# Patient Record
Sex: Male | Born: 1955 | Race: Black or African American | Hispanic: No | Marital: Married | Smoking: Former smoker
Health system: Southern US, Community
[De-identification: ages and names within clinical notes are randomized; demographics above are authoritative.]

## PROBLEM LIST (undated history)

## (undated) DIAGNOSIS — C259 Malignant neoplasm of pancreas, unspecified: Secondary | ICD-10-CM

## (undated) DIAGNOSIS — I10 Essential (primary) hypertension: Secondary | ICD-10-CM

## (undated) DIAGNOSIS — F1010 Alcohol abuse, uncomplicated: Secondary | ICD-10-CM

## (undated) DIAGNOSIS — E119 Type 2 diabetes mellitus without complications: Secondary | ICD-10-CM

## (undated) HISTORY — PX: HERNIA REPAIR: SHX51

## (undated) HISTORY — PX: KNEE ARTHROSCOPY: SHX127

## (undated) HISTORY — DX: Malignant neoplasm of pancreas, unspecified: C25.9

---

## 1998-12-20 ENCOUNTER — Encounter (HOSPITAL_BASED_OUTPATIENT_CLINIC_OR_DEPARTMENT_OTHER): Payer: Self-pay | Admitting: General Surgery

## 1998-12-20 ENCOUNTER — Ambulatory Visit (HOSPITAL_COMMUNITY): Admission: RE | Admit: 1998-12-20 | Discharge: 1998-12-20 | Payer: Self-pay | Admitting: General Surgery

## 2000-12-11 ENCOUNTER — Encounter (HOSPITAL_BASED_OUTPATIENT_CLINIC_OR_DEPARTMENT_OTHER): Payer: Self-pay | Admitting: General Surgery

## 2000-12-11 ENCOUNTER — Ambulatory Visit (HOSPITAL_COMMUNITY): Admission: RE | Admit: 2000-12-11 | Discharge: 2000-12-11 | Payer: Self-pay | Admitting: General Surgery

## 2014-09-29 ENCOUNTER — Ambulatory Visit: Payer: Self-pay | Admitting: Skilled Nursing Facility1

## 2015-05-29 ENCOUNTER — Emergency Department (HOSPITAL_COMMUNITY)
Admission: EM | Admit: 2015-05-29 | Discharge: 2015-05-29 | Disposition: A | Discharge status: Home / Self Care | Payer: Managed Care, Other (non HMO) | Source: Home / Self Care | Attending: Emergency Medicine | Admitting: Emergency Medicine

## 2015-05-29 ENCOUNTER — Encounter (HOSPITAL_COMMUNITY): Payer: Self-pay | Admitting: Emergency Medicine

## 2015-05-29 DIAGNOSIS — K047 Periapical abscess without sinus: Secondary | ICD-10-CM

## 2015-05-29 HISTORY — DX: Essential (primary) hypertension: I10

## 2015-05-29 HISTORY — DX: Type 2 diabetes mellitus without complications: E11.9

## 2015-05-29 MED ORDER — IBUPROFEN 800 MG PO TABS: 800.0000 mg | ORAL_TABLET | Freq: Three times a day (TID) | ORAL | Status: DC | PRN

## 2015-05-29 MED ORDER — AMOXICILLIN 875 MG PO TABS: 875.0000 mg | ORAL_TABLET | Freq: Two times a day (BID) | ORAL | Status: DC

## 2015-05-29 MED ORDER — HYDROCODONE-ACETAMINOPHEN 5-325 MG PO TABS: 1.0000 | ORAL_TABLET | Freq: Four times a day (QID) | ORAL | Status: DC | PRN

## 2015-05-29 NOTE — ED Provider Notes (Signed)
CSN: [id number]     Arrival date & time 05/29/15  1354 History   First MD Initiated Contact with Patient 05/29/15 1430     Chief Complaint  Patient presents with  . Abscess   (Consider location/radiation/quality/duration/timing/severity/associated sxs/prior Treatment) HPI  He is a 59 year old man here for evaluation of abscess. He states he noticed swelling in his left upper gum and left upper jaw starting this morning. Over the last 2-3 hours, he has noted a scratchy throat. He denies any fevers or chills. No difficulty swallowing. No nausea or vomiting. He does have a history of what sounds like a peritonsillar abscess in the past.  Past Medical History  Diagnosis Date  . Hypertension   . Diabetes mellitus without complication [facility])    Past Surgical History  Procedure Laterality Date  . Knee arthroscopy      left and right  . Hernia repair     No family history on file. Social History  Substance Use Topics  . Smoking status: Never Smoker   . Smokeless tobacco: None  . Alcohol Use: Yes    Review of Systems As in history of present illness Allergies  Review of patient's allergies indicates no known allergies.  Home Medications   Prior to Admission medications   Medication Sig Start Date End Date Taking? Authorizing Provider  OVER THE COUNTER MEDICATION Diabetic and htn medicine   Yes Historical Provider, MD  amoxicillin (AMOXIL) 875 MG tablet Take 1 tablet (875 mg total) by mouth 2 (two) times daily. 05/29/15   Melony Overly, MD  HYDROcodone-acetaminophen (NORCO) 5-325 MG tablet Take 1 tablet by mouth every 6 (six) hours as needed for moderate pain. 05/29/15   Melony Overly, MD  ibuprofen (ADVIL,MOTRIN) 800 MG tablet Take 1 tablet (800 mg total) by mouth every 8 (eight) hours as needed for moderate pain. 05/29/15   Melony Overly, MD   Meds Ordered and Administered this Visit  Medications - No data to display  BP 162/93 mmHg  Pulse 100  Temp(Src) 98.6 F (37 C)  Resp  24  SpO2 99% No data found.   Physical Exam  Constitutional: He is oriented to person, place, and time. He appears well-developed and well-nourished. No distress.  HENT:  He has a 1 cm abscess just above the left upper lateral incisor. This is draining spontaneously. He has some associated swelling of the left nasolabial fold area. The oropharynx is normal. There is no erythema or exudate. Airway is widely patent.  Cardiovascular: Normal rate.   Pulmonary/Chest: Effort normal.  Neurological: He is alert and oriented to person, place, and time.    ED Course  Procedures (including critical care time)  Labs Review Labs Reviewed - No data to display  Imaging Review No results found.    MDM   1. Dental abscess    Abscess is draining spontaneously. Treat with amoxicillin, ibuprofen, and Norco. Discussed saltwater gargles and ice. Discussed importance of seeing a dentist to evaluate the tooth.    Melony Overly, MD 05/29/15 [phone]

## 2015-05-29 NOTE — Discharge Instructions (Signed)
You have an abscess of your gum. My concern is that it involves the base of the tooth. Take amoxicillin twice a day for 10 days. Take ibuprofen 800 mg every 8 hours as needed for pain. Use the Norco every 4-6 hours as needed for severe pain. Do not drive while taking this medicine. Do saltwater gargles as often as you can. Apply ice to your jaw to help with the swelling. Please follow-up with a dentist as soon as possible. If you develop fevers or difficulty swallowing, please go to the emergency room.

## 2015-05-29 NOTE — ED Notes (Signed)
Patient reports "abscess in left cheek"  Reports a blister on gum and noticed all this today.  Reports in the last 2 hours has had a scratchy throat.  History of abscess in throat.

## 2016-03-12 ENCOUNTER — Encounter (HOSPITAL_COMMUNITY): Payer: Self-pay

## 2016-03-12 ENCOUNTER — Emergency Department (HOSPITAL_COMMUNITY): Payer: BLUE CROSS/BLUE SHIELD

## 2016-03-12 ENCOUNTER — Observation Stay (HOSPITAL_COMMUNITY)
Admission: EM | Admit: 2016-03-12 | Discharge: 2016-03-14 | Disposition: A | Discharge status: Home / Self Care | Payer: BLUE CROSS/BLUE SHIELD | Attending: Internal Medicine | Admitting: Internal Medicine

## 2016-03-12 DIAGNOSIS — Z87891 Personal history of nicotine dependence: Secondary | ICD-10-CM | POA: Diagnosis not present

## 2016-03-12 DIAGNOSIS — K831 Obstruction of bile duct: Secondary | ICD-10-CM | POA: Insufficient documentation

## 2016-03-12 DIAGNOSIS — K869 Disease of pancreas, unspecified: Secondary | ICD-10-CM | POA: Diagnosis present

## 2016-03-12 DIAGNOSIS — C250 Malignant neoplasm of head of pancreas: Principal | ICD-10-CM | POA: Insufficient documentation

## 2016-03-12 DIAGNOSIS — K8689 Other specified diseases of pancreas: Secondary | ICD-10-CM

## 2016-03-12 DIAGNOSIS — F1010 Alcohol abuse, uncomplicated: Secondary | ICD-10-CM | POA: Insufficient documentation

## 2016-03-12 DIAGNOSIS — R1013 Epigastric pain: Secondary | ICD-10-CM

## 2016-03-12 DIAGNOSIS — E1165 Type 2 diabetes mellitus with hyperglycemia: Secondary | ICD-10-CM | POA: Insufficient documentation

## 2016-03-12 DIAGNOSIS — Z794 Long term (current) use of insulin: Secondary | ICD-10-CM | POA: Insufficient documentation

## 2016-03-12 DIAGNOSIS — I10 Essential (primary) hypertension: Secondary | ICD-10-CM | POA: Diagnosis not present

## 2016-03-12 DIAGNOSIS — N179 Acute kidney failure, unspecified: Secondary | ICD-10-CM

## 2016-03-12 DIAGNOSIS — R000 Tachycardia, unspecified: Secondary | ICD-10-CM

## 2016-03-12 DIAGNOSIS — E118 Type 2 diabetes mellitus with unspecified complications: Secondary | ICD-10-CM

## 2016-03-12 HISTORY — DX: Alcohol abuse, uncomplicated: F10.10

## 2016-03-12 LAB — BASIC METABOLIC PANEL
Anion gap: 14 (ref 5–15)
BUN: 17 mg/dL (ref 6–20)
CALCIUM: 9.8 mg/dL (ref 8.9–10.3)
CO2: 21 mmol/L — ABNORMAL LOW (ref 22–32)
CREATININE: 1.37 mg/dL — AB (ref 0.61–1.24)
Chloride: 101 mmol/L (ref 101–111)
GFR calc non Af Amer: 55 mL/min — ABNORMAL LOW (ref >60)
Glucose, Bld: 184 mg/dL — ABNORMAL HIGH (ref 65–99)
Potassium: 3.8 mmol/L (ref 3.5–5.1)
SODIUM: 136 mmol/L (ref 135–145)

## 2016-03-12 LAB — CBC
HCT: 43 % (ref 39.0–52.0)
Hemoglobin: 14.3 g/dL (ref 13.0–17.0)
MCH: 27.3 pg (ref 26.0–34.0)
MCHC: 33.3 g/dL (ref 30.0–36.0)
MCV: 82.2 fL (ref 78.0–100.0)
PLATELETS: 282 10*3/uL (ref 150–400)
RBC: 5.23 MIL/uL (ref 4.22–5.81)
RDW: 13.8 % (ref 11.5–15.5)
WBC: 7.1 10*3/uL (ref 4.0–10.5)

## 2016-03-12 LAB — HEPATIC FUNCTION PANEL
ALK PHOS: 679 U/L — AB (ref 38–126)
ALT: 890 U/L — AB (ref 17–63)
AST: 414 U/L — ABNORMAL HIGH (ref 15–41)
Albumin: 3.5 g/dL (ref 3.5–5.0)
BILIRUBIN DIRECT: 4.7 mg/dL — AB (ref 0.1–0.5)
Indirect Bilirubin: 2.6 mg/dL — ABNORMAL HIGH (ref 0.3–0.9)
TOTAL PROTEIN: 7.8 g/dL (ref 6.5–8.1)
Total Bilirubin: 7.3 mg/dL — ABNORMAL HIGH (ref 0.3–1.2)

## 2016-03-12 LAB — GLUCOSE, CAPILLARY: Glucose-Capillary: 332 mg/dL — ABNORMAL HIGH (ref 65–99)

## 2016-03-12 LAB — LIPASE, BLOOD: LIPASE: 180 U/L — AB (ref 11–51)

## 2016-03-12 LAB — I-STAT TROPONIN, ED: TROPONIN I, POC: 0 ng/mL (ref 0.00–0.08)

## 2016-03-12 MED ORDER — ADULT MULTIVITAMIN W/MINERALS CH
1.0000 | ORAL_TABLET | Freq: Every day | ORAL | Status: DC
  Administered 2016-03-12 – 2016-03-14 (×3): 1 via ORAL
  Filled 2016-03-12 (×3): qty 1

## 2016-03-12 MED ORDER — THIAMINE HCL 100 MG/ML IJ SOLN
100.0000 mg | Freq: Every day | INTRAMUSCULAR | Status: DC
  Filled 2016-03-12 (×2): qty 2

## 2016-03-12 MED ORDER — POLYETHYLENE GLYCOL 3350 17 G PO PACK: 17.0000 g | PACK | Freq: Every day | ORAL | Status: DC | PRN

## 2016-03-12 MED ORDER — TRAMADOL HCL 50 MG PO TABS
50.0000 mg | ORAL_TABLET | Freq: Four times a day (QID) | ORAL | Status: DC | PRN
  Administered 2016-03-13 – 2016-03-14 (×2): 50 mg via ORAL
  Filled 2016-03-12 (×2): qty 1

## 2016-03-12 MED ORDER — SODIUM CHLORIDE 0.9 % IV SOLN
INTRAVENOUS | Status: DC
  Administered 2016-03-12: 23:00:00 via INTRAVENOUS

## 2016-03-12 MED ORDER — INSULIN ASPART 100 UNIT/ML ~~LOC~~ SOLN
0.0000 [IU] | Freq: Three times a day (TID) | SUBCUTANEOUS | Status: DC
  Administered 2016-03-13: 2 [IU] via SUBCUTANEOUS
  Administered 2016-03-13: 3 [IU] via SUBCUTANEOUS
  Administered 2016-03-14: 8 [IU] via SUBCUTANEOUS
  Administered 2016-03-14: 3 [IU] via SUBCUTANEOUS

## 2016-03-12 MED ORDER — VITAMIN B-1 100 MG PO TABS
100.0000 mg | ORAL_TABLET | Freq: Every day | ORAL | Status: DC
  Administered 2016-03-12 – 2016-03-14 (×3): 100 mg via ORAL
  Filled 2016-03-12 (×3): qty 1

## 2016-03-12 MED ORDER — IOPAMIDOL (ISOVUE-300) INJECTION 61%
100.0000 mL | Freq: Once | INTRAVENOUS | Status: AC | PRN
  Administered 2016-03-12: 100 mL via INTRAVENOUS

## 2016-03-12 MED ORDER — ONDANSETRON HCL 4 MG/2ML IJ SOLN
4.0000 mg | Freq: Four times a day (QID) | INTRAMUSCULAR | Status: DC | PRN
  Administered 2016-03-13: 4 mg via INTRAVENOUS

## 2016-03-12 MED ORDER — LORAZEPAM 2 MG/ML IJ SOLN: 1.0000 mg | Freq: Four times a day (QID) | INTRAMUSCULAR | Status: DC | PRN

## 2016-03-12 MED ORDER — MORPHINE SULFATE (PF) 4 MG/ML IV SOLN
4.0000 mg | INTRAVENOUS | Status: DC | PRN
  Administered 2016-03-13: 4 mg via INTRAVENOUS
  Filled 2016-03-12: qty 1

## 2016-03-12 MED ORDER — ACETAMINOPHEN 650 MG RE SUPP: 650.0000 mg | Freq: Four times a day (QID) | RECTAL | Status: DC | PRN

## 2016-03-12 MED ORDER — BISACODYL 5 MG PO TBEC
5.0000 mg | DELAYED_RELEASE_TABLET | Freq: Every day | ORAL | Status: DC | PRN
  Filled 2016-03-12: qty 1

## 2016-03-12 MED ORDER — ACETAMINOPHEN 325 MG PO TABS: 650.0000 mg | ORAL_TABLET | Freq: Four times a day (QID) | ORAL | Status: DC | PRN

## 2016-03-12 MED ORDER — SODIUM CHLORIDE 0.9 % IV SOLN: INTRAVENOUS | Status: DC

## 2016-03-12 MED ORDER — ONDANSETRON HCL 4 MG PO TABS: 4.0000 mg | ORAL_TABLET | Freq: Four times a day (QID) | ORAL | Status: DC | PRN

## 2016-03-12 MED ORDER — LORAZEPAM 1 MG PO TABS
1.0000 mg | ORAL_TABLET | Freq: Four times a day (QID) | ORAL | Status: DC | PRN
  Administered 2016-03-12 – 2016-03-13 (×2): 1 mg via ORAL
  Filled 2016-03-12 (×2): qty 1

## 2016-03-12 MED ORDER — FOLIC ACID 1 MG PO TABS
1.0000 mg | ORAL_TABLET | Freq: Every day | ORAL | Status: DC
  Administered 2016-03-12 – 2016-03-14 (×3): 1 mg via ORAL
  Filled 2016-03-12 (×3): qty 1

## 2016-03-12 NOTE — ED Notes (Signed)
Pt states since checking in his chest pain has worsened. Pt states he is "cold and clammy feeling"

## 2016-03-12 NOTE — ED Notes (Signed)
Pt to ultrasound

## 2016-03-12 NOTE — Consult Note (Signed)
Reason for Consult: Abnormal LFT's/pancreatic mass. Referring Physician: THP  Craig [name] is an 60 y.o. male.  HPI: Craig [name] is a 60 year old black male, who was in his usual state of health till about a month ago, when he developed some epigastric and chest discomfort. Patient claims he has seen his PCP on several occasions for this problem but no definite diagnosis was made to today when he was told he had abnormal LFTs and was sent to the hospital for further evaluation. He was found to have a pancreatic mass on evaluation in the ER after he had a CT scan nd was hospitalized for further diagnosis and treatment. The CT scan done today revealed a mass in the pancreatic head/uncinate process measuring 3.1 x 2 x 2.6 cm in size with the CBD measuring 10 mm. He was told by his wife that he his eyes appeared yellow and over the last month he's noted darker urine. He has lost about 10 pounds in the last couple weeks. His appetite is fair. He denies having any melena or hematochezia he has a problem with occasional constipation. There is no history of abdominal pain nausea vomiting dysphagia or odynophagia. He is up-to-date on his colonoscopy which was done a couple of years ago by Dr. Carol Ada. Past Medical History:  Diagnosis Date  . Diabetes mellitus without complication ([place])   . ETOH abuse   . Hypertension    Past Surgical History:  Procedure Laterality Date  . HERNIA REPAIR    . KNEE ARTHROSCOPY     left and right   Family History  Problem Relation Age of Onset  . Diabetes Mother    Social History:  reports that he has never smoked. He has never used smokeless tobacco. He reports that he drinks alcohol. He admits to drinking about 6 beers per night for the last 15 years  He reports that he does not use drugs. He works in the produce department" and [place] he is married and lives with his wife here in [place]. Allergies: No Known Allergies  Medications: I  have reviewed the patient's current medications.  Results for orders placed or performed during the hospital encounter of 03/12/16 (from the past 48 hour(s))  Basic metabolic panel     Status: Abnormal   Collection Time: 03/12/16  9:33 AM  Result Value Ref Range   Sodium 136 135 - 145 mmol/L   Potassium 3.8 3.5 - 5.1 mmol/L   Chloride 101 101 - 111 mmol/L   CO2 21 (L) 22 - 32 mmol/L   Glucose, Bld 184 (H) 65 - 99 mg/dL   BUN 17 6 - 20 mg/dL   Creatinine, Ser 1.37 (H) 0.61 - 1.24 mg/dL   Calcium 9.8 8.9 - 10.3 mg/dL   GFR calc non Af Amer 55 (L) >60 mL/min   GFR calc Af Amer >60 >60 mL/min    Comment: (NOTE) The eGFR has been calculated using the CKD EPI equation. This calculation has not been validated in all clinical situations. eGFR's persistently <60 mL/min signify possible Chronic Kidney Disease.    Anion gap 14 5 - 15  CBC     Status: None   Collection Time: 03/12/16  9:33 AM  Result Value Ref Range   WBC 7.1 4.0 - 10.5 K/uL   RBC 5.23 4.22 - 5.81 MIL/uL   Hemoglobin 14.3 13.0 - 17.0 g/dL   HCT 43.0 39.0 - 52.0 %   MCV 82.2 78.0 - 100.0  fL   MCH 27.3 26.0 - 34.0 pg   MCHC 33.3 30.0 - 36.0 g/dL   RDW 13.8 11.5 - 15.5 %   Platelets 282 150 - 400 K/uL  Lipase, blood     Status: Abnormal   Collection Time: 03/12/16  9:33 AM  Result Value Ref Range   Lipase 180 (H) 11 - 51 U/L  Hepatic function panel     Status: Abnormal   Collection Time: 03/12/16  9:33 AM  Result Value Ref Range   Total Protein 7.8 6.5 - 8.1 g/dL   Albumin 3.5 3.5 - 5.0 g/dL   AST 414 (H) 15 - 41 U/L   ALT 890 (H) 17 - 63 U/L   Alkaline Phosphatase 679 (H) 38 - 126 U/L   Total Bilirubin 7.3 (H) 0.3 - 1.2 mg/dL   Bilirubin, Direct 4.7 (H) 0.1 - 0.5 mg/dL   Indirect Bilirubin 2.6 (H) 0.3 - 0.9 mg/dL  I-stat troponin, ED     Status: None   Collection Time: 03/12/16  9:54 AM  Result Value Ref Range   Troponin i, poc 0.00 0.00 - 0.08 ng/mL   Comment 3            Comment: Due to the release kinetics  of cTnI, a negative result within the first hours of the onset of symptoms does not rule out myocardial infarction with certainty. If myocardial infarction is still suspected, repeat the test at appropriate intervals.     Dg Chest 2 View  Result Date: 03/12/2016 CLINICAL DATA:  Chest and epigastric region pain EXAM: CHEST  2 VIEW COMPARISON:  None. FINDINGS: Lungs are clear. Heart size and pulmonary vascularity are normal. No adenopathy. There is degenerative change in the thoracic spine. IMPRESSION: No edema or consolidation. Electronically Signed   By: Lowella Grip III M.D.   On: 03/12/2016 10:10   US Abdomen Complete  Result Date: 03/12/2016 CLINICAL DATA:  Chest pain.  Abnormal LFTs. EXAM: ABDOMEN ULTRASOUND COMPLETE COMPARISON:  None. FINDINGS: Gallbladder: No gallstones or wall thickening visualized. No sonographic Murphy sign noted by sonographer. Moderate distention, normal wall thickness. Common bile duct: Diameter: Increased, between 7.3 and 7.9 mm. Liver: No focal lesion identified. Within normal limits in parenchymal echogenicity. Intrahepatic biliary ductal dilatation is present. IVC: No abnormality visualized. Pancreas: Increased size of the pancreatic duct, up to 4 mm. No mass is evident. Spleen: Size and appearance within normal limits. Right Kidney: Length: 9.9 cm. Echogenicity within normal limits. No mass or hydronephrosis visualized. Left Kidney: Length: 11.8 cm. Echogenicity within normal limits. No mass or hydronephrosis visualized. Two 1 cm simple cysts are noted in the kidney, non worrisome Abdominal aorta: No aneurysm visualized. Other findings: None. IMPRESSION: Intra and extrahepatic biliary ductal dilatation is present, along with abnormal dilatation of the pancreatic duct. CBD up to 8 mm and PD approximately 4 mm. Concern for ampullary mass or stone. CT abdomen and pelvis with contrast recommended for further evaluation. Electronically Signed   By: Staci Righter M.D.    On: 03/12/2016 12:22   Ct Abdomen Pelvis W Contrast  Result Date: 03/12/2016 CLINICAL DATA:  Epigastric pain off and on for 1 month, abnormal bile duct by ultrasound EXAM: CT ABDOMEN AND PELVIS WITH CONTRAST TECHNIQUE: Multidetector CT imaging of the abdomen and pelvis was performed using the standard protocol following bolus administration of intravenous contrast. Sagittal and coronal MPR images reconstructed from axial data set. CONTRAST:  132m ISOVUE-300 IOPAMIDOL (ISOVUE-300) INJECTION 61% IV. No oral contrast  administered. COMPARISON:  None; correlation ultrasound abdomen 03/12/2016 FINDINGS: Lower chest:  Lung bases clear Hepatobiliary: Liver normal appearance. Small fold at gallbladder fundus, gallbladder otherwise unremarkable. Prominent CBD 10 mm diameter on coronal images. No significant intrahepatic biliary dilatation. Pancreas: Somewhat ill defined low-attenuation mass at head/uncinate of pancreas, 3.1 x 2.0 x 2.6 cm in size highly suspicious for a pancreatic neoplasm. Preserved fat planes between the pancreatic mass, SMA, and SMV. Mass abuts the second and third portions of duodenum. Question loss of fat plane between the posterior aspect of mass and IVC. No pancreatic ductal dilatation or atrophy. No pancreatic calcifications or additional fluid collections. Spleen: Normal appearance Adrenals/Urinary Tract: Adrenal glands normal appearance. Tiny RIGHT renal cyst. Kidneys, ureters, and bladder otherwise normal appearance. Stomach/Bowel: Normal appendix. Stomach and bowel loops normal appearance. Vascular/Lymphatic: Atherosclerotic calcifications aorta and iliac arteries. Coronary arterial calcifications. No definite adenopathy. Reproductive: N/A Other: Nodular irregularity of the anterior abdominal wall at the umbilicus question prior umblical hernia repair. Tiny LEFT parasagittal periumbilical hernia. No free air or free fluid. Musculoskeletal: Osseous structures unremarkable. IMPRESSION: Mildly  dilated CBD 10 mm diameter without intrahepatic biliary dilatation. 3.1 x 2.0 x 2.6 cm diameter mass at head/uncinate process of pancreas highly suspicious for pancreatic neoplasm. Findings called to Dr. Tomi Bamberger on 03/12/2016 at 1353 hours. Electronically Signed   By: Lavonia Dana M.D.   On: 03/12/2016 13:54   Review of Systems  Constitutional: Positive for malaise/fatigue and weight loss. Negative for chills and fever.  HENT: Negative.   Eyes: Negative.   Respiratory: Positive for shortness of breath. Negative for cough, hemoptysis, sputum production and wheezing.   Cardiovascular: Positive for chest pain. Negative for palpitations, orthopnea, claudication and PND.  Gastrointestinal: Negative for abdominal pain, blood in stool, diarrhea, heartburn, melena, nausea and vomiting.  Genitourinary: Negative.   Musculoskeletal: Negative.   Skin: Negative.   Neurological: Positive for weakness.  Endo/Heme/Allergies: Negative.   Psychiatric/Behavioral: Negative.    Blood pressure 119/77, pulse 97, temperature 98.2 F (36.8 C), temperature source Oral, resp. rate 21, SpO2 100 %. Physical Exam  Constitutional: He is oriented to person, place, and time. He appears well-developed and well-nourished.  HENT:  Head: Normocephalic and atraumatic.  Eyes: EOM are normal. Pupils are equal, round, and reactive to light. Scleral icterus is present.  Neck: Normal range of motion. Neck supple.  Cardiovascular: Normal rate and regular rhythm.   Respiratory: Effort normal and breath sounds normal.  GI: Soft. Bowel sounds are normal.  Musculoskeletal: Normal range of motion.  Neurological: He is alert and oriented to person, place, and time.  Skin: Skin is warm and dry.   Assessment/Plan: 1) Abnormal LFTs with a 3 cm mass in the pancreatic head/uncinate process with a dilated CBD at 10 mm-will plan a ERCP for tomorrow. 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 2) Alcohol abuse.  3)  Hypertension.  4) AODM.  Bassel Gaskill 03/12/2016, 6:42 PM

## 2016-03-12 NOTE — ED Notes (Signed)
Report called to 2 west.

## 2016-03-12 NOTE — ED Notes (Signed)
Pt last ate last night, only has had water this am.

## 2016-03-12 NOTE — H&P (Signed)
History and Physical    Craig [name] [id number] DOB: [birth year] DOA: 03/12/2016  PCP: Jilda Panda, MD  Patient coming from:   Home   Chief Complaint: Epigastric pain  HPI: Craig [name] is a 60 y.o. male with a medical history significant for, but not  limited to,  alcohol abuse, diabetes, high blood pressure. Patient has been having intermittent epigastric pain for one month. Pain is unrelated to meals or position, or activity.. Pain does not radiate and there is no associated nausea. Patient's wife states he has clearly lost weight. PCP has been evaluating patient and referral to cardiologist was made.. In the interim patient's LFTs were found to be markedly abnormal, patient was sent to the emergency department for evaluation.  Patient has remote history of smoking. He drinks a sixpack every day, none in the last 3 days. No family history of pancreatic cancer.  ED Course:  Afebrile, tachycardic 101-120, blood pressure borderline low at times. Point-of-care troponin negative, glucose 184, lipase 180, AST 414, ALT 890, total bilirubin 7.2, alkaline phosphatase 679, creatinine 1.37 per the sees no baseline to compare)  Ultrasound reveals intra-and extrahepatic biliary duct dilation with abnormal dilation of pancreatic duct. CT scan show CBD at 10 mm, no intrahepatic biliary duct dilation. There is a pancreatic head mass suspicious for pancreatic neoplasm seen   Review of Systems: As per HPI, otherwise 10 point review of systems negative.   Past Medical History:  Diagnosis Date  . Diabetes mellitus without complication ([place])   . Hypertension     Past Surgical History:  Procedure Laterality Date  . HERNIA REPAIR    . KNEE ARTHROSCOPY     left and right    Social History   Social History  . Marital status: Married    Spouse name: N/A  . Number of children: N/A  . Years of education: N/A   Occupational History  . Not on file.   Social History Main Topics  .  Smoking status: Never Smoker  . Smokeless tobacco: Never Used  . Alcohol use Yes  . Drug use: No  . Sexual activity: Not on file   Other Topics Concern  . Not on file   Social History Narrative  . No narrative on file   Married, lives with wife at home. No assistive devices needed for ambulation. Nonsmoker. Patient drinks a 6 pack every day No Known Allergies  Family History  Problem Relation Age of Onset  . Diabetes Mother     Prior to Admission medications   Medication Sig Start Date End Date Taking? Authorizing Provider  Insulin Glargine (LANTUS SOLOSTAR) 100 UNIT/ML Solostar Pen Inject 22 Units into the skin every morning.   Yes Historical Provider, MD    Physical Exam: Vitals:   03/12/16 1035 03/12/16 1045 03/12/16 1050 03/12/16 1105  BP: (!) 81/56 100/68 113/70 108/81  Pulse: 120 96 102 101  Resp: 19 18 18 19   Temp:      TempSrc:      SpO2: 100% 97% 99% 97%    Constitutional:  Pleasant 60 year old black male in NAD, calm, comfortable Vitals:   03/12/16 1035 03/12/16 1045 03/12/16 1050 03/12/16 1105  BP: (!) 81/56 100/68 113/70 108/81  Pulse: 120 96 102 101  Resp: 19 18 18 19   Temp:      TempSrc:      SpO2: 100% 97% 99% 97%   Eyes: PER, lids and conjunctivae normal ENMT: Mucous membranes are moist. Posterior pharynx clear of any  exudate or lesions..  Neck: normal, supple, no masses Respiratory: clear to auscultation bilaterally, no wheezing, no crackles. Normal respiratory effort. No accessory muscle use.  Cardiovascular: Regular rate and rhythm, no murmurs / rubs / gallops. No extremity edema. 2+ pedal pulses.   Abdomen: no tenderness, no masses palpated. No hepatomegaly. Bowel sounds positive.  Musculoskeletal: no clubbing / cyanosis. No joint deformity upper and lower extremities. Good ROM, no contractures. Normal muscle tone.  Skin: no rashes, lesions, ulcers.  Neurologic: CN 2-12 grossly intact. Sensation intact, Strength 5/5 in all 4.  Psychiatric:  Normal judgment and insight. Alert and oriented x 3. Normal mood.   Labs on Admission: I have personally reviewed following labs and imaging studies  Radiological Exams on Admission: Dg Chest 2 View  Result Date: 03/12/2016 CLINICAL DATA:  Chest and epigastric region pain EXAM: CHEST  2 VIEW COMPARISON:  None. FINDINGS: Lungs are clear. Heart size and pulmonary vascularity are normal. No adenopathy. There is degenerative change in the thoracic spine. IMPRESSION: No edema or consolidation. Electronically Signed   By: Lowella Grip III M.D.   On: 03/12/2016 10:10   US Abdomen Complete  Result Date: 03/12/2016 CLINICAL DATA:  Chest pain.  Abnormal LFTs. EXAM: ABDOMEN ULTRASOUND COMPLETE COMPARISON:  None. FINDINGS: Gallbladder: No gallstones or wall thickening visualized. No sonographic Murphy sign noted by sonographer. Moderate distention, normal wall thickness. Common bile duct: Diameter: Increased, between 7.3 and 7.9 mm. Liver: No focal lesion identified. Within normal limits in parenchymal echogenicity. Intrahepatic biliary ductal dilatation is present. IVC: No abnormality visualized. Pancreas: Increased size of the pancreatic duct, up to 4 mm. No mass is evident. Spleen: Size and appearance within normal limits. Right Kidney: Length: 9.9 cm. Echogenicity within normal limits. No mass or hydronephrosis visualized. Left Kidney: Length: 11.8 cm. Echogenicity within normal limits. No mass or hydronephrosis visualized. Two 1 cm simple cysts are noted in the kidney, non worrisome Abdominal aorta: No aneurysm visualized. Other findings: None. IMPRESSION: Intra and extrahepatic biliary ductal dilatation is present, along with abnormal dilatation of the pancreatic duct. CBD up to 8 mm and PD approximately 4 mm. Concern for ampullary mass or stone. CT abdomen and pelvis with contrast recommended for further evaluation. Electronically Signed   By: Staci Righter M.D.   On: 03/12/2016 12:22   Ct Abdomen Pelvis  W Contrast  Result Date: 03/12/2016 CLINICAL DATA:  Epigastric pain off and on for 1 month, abnormal bile duct by ultrasound EXAM: CT ABDOMEN AND PELVIS WITH CONTRAST TECHNIQUE: Multidetector CT imaging of the abdomen and pelvis was performed using the standard protocol following bolus administration of intravenous contrast. Sagittal and coronal MPR images reconstructed from axial data set. CONTRAST:  163mL ISOVUE-300 IOPAMIDOL (ISOVUE-300) INJECTION 61% IV. No oral contrast administered. COMPARISON:  None; correlation ultrasound abdomen 03/12/2016 FINDINGS: Lower chest:  Lung bases clear Hepatobiliary: Liver normal appearance. Small fold at gallbladder fundus, gallbladder otherwise unremarkable. Prominent CBD 10 mm diameter on coronal images. No significant intrahepatic biliary dilatation. Pancreas: Somewhat ill defined low-attenuation mass at head/uncinate of pancreas, 3.1 x 2.0 x 2.6 cm in size highly suspicious for a pancreatic neoplasm. Preserved fat planes between the pancreatic mass, SMA, and SMV. Mass abuts the second and third portions of duodenum. Question loss of fat plane between the posterior aspect of mass and IVC. No pancreatic ductal dilatation or atrophy. No pancreatic calcifications or additional fluid collections. Spleen: Normal appearance Adrenals/Urinary Tract: Adrenal glands normal appearance. Tiny RIGHT renal cyst. Kidneys, ureters, and bladder otherwise  normal appearance. Stomach/Bowel: Normal appendix. Stomach and bowel loops normal appearance. Vascular/Lymphatic: Atherosclerotic calcifications aorta and iliac arteries. Coronary arterial calcifications. No definite adenopathy. Reproductive: N/A Other: Nodular irregularity of the anterior abdominal wall at the umbilicus question prior umblical hernia repair. Tiny LEFT parasagittal periumbilical hernia. No free air or free fluid. Musculoskeletal: Osseous structures unremarkable. IMPRESSION: Mildly dilated CBD 10 mm diameter without  intrahepatic biliary dilatation. 3.1 x 2.0 x 2.6 cm diameter mass at head/uncinate process of pancreas highly suspicious for pancreatic neoplasm. Findings called to Dr. Tomi Bamberger on 03/12/2016 at 1353 hours. Electronically Signed   By: Lavonia Dana M.D.   On: 03/12/2016 13:54   EKG shows sinus tachycardia, rate 105, nonspecific T-wave abnormalities. QTC 457.  Assessment/Plan   Active Problems:   Pancreatic mass   Epigastric pain   AKI (acute kidney injury) ([place])   Diabetes mellitus, type 2 ([facility])   ETOH abuse   Sinus tachycardia ([facility])   Epigastric pain, markedly abnormal LFTs and mixed pattern in setting of biliary duct dilation and pancreatic mass  on imaging.  -Place in Obs - medical bed         -Pain control -Consult Dr. Benson Norway - patient known to him.    AKI, baseline creatine unknown so not sure if he has CKD.  Cr 1.27, normal BUN -BUN doesn't suggest volume depletion but has had little PO today, will start fluids at 75 ml /hr -avoid nephrotoxic medications -am bmet  Sinus tachycardia, probably secondary to pain and anxiety since receiving news of pancreatic mass -Monitor on telemetry  DM2. .Takes 22 units basal insulin in am (didn't have today) -A1c SSI- moderate  HTN, controlled at present. He is not on any home meds.   ETOH abuse. Drinks 6 beers / night + Liquor. Last drink 3 days ago.   -CIWA  DVT prophylaxis:     SCDs, will likely need procedures soon Code Status:     Full code   Family Communication:    Treatment plan discussed with wife in the room and she understands and agrees with the plan..  Disposition Plan:   Discharge in 24-48 hours              Consults called:  Dr. Benson Norway - [facility].   Admission status:   Observation - Medical   Tye Savoy NP [facility] Pager [phone]  If 7PM-7AM, please contact night-coverage [email or web address] Password [email or web address]  03/12/2016, 4:55 PM

## 2016-03-12 NOTE — ED Provider Notes (Signed)
[facility] DEPT Provider Note   CSN: [id number] Arrival date & time: 03/12/16  [id number]  First Provider Contact:  First MD Initiated Contact with Patient 03/12/16 1038        History   Chief Complaint Chief Complaint  Patient presents with  . Chest Pain    HPI DAWTON Craig [name] is a 60 y.o. male.  Pt has seen his PCP for this problem over the past month.   He was told that he had abnormal liver enzymes and needed to come to the ED for an [place].  He has seen his PCP several times and is setup for a stres test on monday   The history is provided by the patient.  Chest Pain   This is a recurrent problem. The current episode started more than 1 week ago (about one month). The problem has been gradually worsening. The pain is present in the epigastric region. The pain is moderate. The pain does not radiate. Associated symptoms include abdominal pain and shortness of breath. Pertinent negatives include no cough, no fever, no nausea and no vomiting. The treatment provided no relief. Risk factors include oral contraceptive use.  His past medical history is significant for diabetes.  Pertinent negatives for past medical history include no CAD and no PE.    Past Medical History:  Diagnosis Date  . Diabetes mellitus without complication ([place])   . Hypertension     There are no active problems to display for this patient.   Past Surgical History:  Procedure Laterality Date  . HERNIA REPAIR    . KNEE ARTHROSCOPY     left and right       Home Medications    Prior to Admission medications   Medication Sig Start Date End Date Taking? Authorizing Provider  amoxicillin (AMOXIL) 875 MG tablet Take 1 tablet (875 mg total) by mouth 2 (two) times daily. 05/29/15   Melony Overly, MD  HYDROcodone-acetaminophen (NORCO) 5-325 MG tablet Take 1 tablet by mouth every 6 (six) hours as needed for moderate pain. 05/29/15   Melony Overly, MD  ibuprofen (ADVIL,MOTRIN) 800 MG tablet Take 1 tablet (800  mg total) by mouth every 8 (eight) hours as needed for moderate pain. 05/29/15   Melony Overly, MD  OVER THE COUNTER MEDICATION Diabetic and htn medicine    Historical Provider, MD    Family History History reviewed. No pertinent family history.  Social History Social History  Substance Use Topics  . Smoking status: Never Smoker  . Smokeless tobacco: Never Used  . Alcohol use Yes     Allergies   Review of patient's allergies indicates no known allergies.   Review of Systems Review of Systems  Constitutional: Negative for fever.  Respiratory: Positive for shortness of breath. Negative for cough.   Cardiovascular: Positive for chest pain.  Gastrointestinal: Positive for abdominal pain. Negative for nausea and vomiting.  All other systems reviewed and are negative.    Physical Exam Updated Vital Signs BP 108/81   Pulse 101   Temp 98.7 F (37.1 C) (Oral)   Resp 19   SpO2 97%   Physical Exam  Constitutional: He appears well-developed and well-nourished. No distress.  HENT:  Head: Normocephalic and atraumatic.  Right Ear: External ear normal.  Left Ear: External ear normal.  Eyes: Conjunctivae are normal. Right eye exhibits no discharge. Left eye exhibits no discharge. No scleral icterus.  Neck: Neck supple. No tracheal deviation present.  Cardiovascular: Normal rate, regular rhythm  and intact distal pulses.   Pulmonary/Chest: Effort normal and breath sounds normal. No stridor. No respiratory distress. He has no wheezes. He has no rales.  Abdominal: Soft. Bowel sounds are normal. He exhibits no distension. There is no tenderness. There is no rebound and no guarding.  Musculoskeletal: He exhibits no edema or tenderness.  Neurological: He is alert. He has normal strength. No cranial nerve deficit (no facial droop, extraocular movements intact, no slurred speech) or sensory deficit. He exhibits normal muscle tone. He displays no seizure activity. Coordination normal.  Skin:  Skin is warm and dry. No rash noted.  Psychiatric: He has a normal mood and affect.  Nursing note and vitals reviewed.    ED Treatments / Results  Labs (all labs ordered are listed, but only abnormal results are displayed) Labs Reviewed  BASIC METABOLIC PANEL - Abnormal; Notable for the following:       Result Value   CO2 21 (*)    Glucose, Bld 184 (*)    Creatinine, Ser 1.37 (*)    GFR calc non Af Amer 55 (*)    All other components within normal limits  LIPASE, BLOOD - Abnormal; Notable for the following:    Lipase 180 (*)    All other components within normal limits  HEPATIC FUNCTION PANEL - Abnormal; Notable for the following:    AST 414 (*)    ALT 890 (*)    Alkaline Phosphatase 679 (*)    Total Bilirubin 7.3 (*)    Bilirubin, Direct 4.7 (*)    Indirect Bilirubin 2.6 (*)    All other components within normal limits  CBC  I-STAT TROPOININ, ED    EKG  EKG Interpretation None       Radiology Dg Chest 2 View  Result Date: 03/12/2016 CLINICAL DATA:  Chest and epigastric region pain EXAM: CHEST  2 VIEW COMPARISON:  None. FINDINGS: Lungs are clear. Heart size and pulmonary vascularity are normal. No adenopathy. There is degenerative change in the thoracic spine. IMPRESSION: No edema or consolidation. Electronically Signed   By: Lowella Grip III M.D.   On: 03/12/2016 10:10   US Abdomen Complete  Result Date: 03/12/2016 CLINICAL DATA:  Chest pain.  Abnormal LFTs. EXAM: ABDOMEN ULTRASOUND COMPLETE COMPARISON:  None. FINDINGS: Gallbladder: No gallstones or wall thickening visualized. No sonographic Murphy sign noted by sonographer. Moderate distention, normal wall thickness. Common bile duct: Diameter: Increased, between 7.3 and 7.9 mm. Liver: No focal lesion identified. Within normal limits in parenchymal echogenicity. Intrahepatic biliary ductal dilatation is present. IVC: No abnormality visualized. Pancreas: Increased size of the pancreatic duct, up to 4 mm. No mass is  evident. Spleen: Size and appearance within normal limits. Right Kidney: Length: 9.9 cm. Echogenicity within normal limits. No mass or hydronephrosis visualized. Left Kidney: Length: 11.8 cm. Echogenicity within normal limits. No mass or hydronephrosis visualized. Two 1 cm simple cysts are noted in the kidney, non worrisome Abdominal aorta: No aneurysm visualized. Other findings: None. IMPRESSION: Intra and extrahepatic biliary ductal dilatation is present, along with abnormal dilatation of the pancreatic duct. CBD up to 8 mm and PD approximately 4 mm. Concern for ampullary mass or stone. CT abdomen and pelvis with contrast recommended for further evaluation. Electronically Signed   By: Staci Righter M.D.   On: 03/12/2016 12:22   Ct Abdomen Pelvis W Contrast  Result Date: 03/12/2016 CLINICAL DATA:  Epigastric pain off and on for 1 month, abnormal bile duct by ultrasound EXAM: CT ABDOMEN AND  PELVIS WITH CONTRAST TECHNIQUE: Multidetector CT imaging of the abdomen and pelvis was performed using the standard protocol following bolus administration of intravenous contrast. Sagittal and coronal MPR images reconstructed from axial data set. CONTRAST:  131mL ISOVUE-300 IOPAMIDOL (ISOVUE-300) INJECTION 61% IV. No oral contrast administered. COMPARISON:  None; correlation ultrasound abdomen 03/12/2016 FINDINGS: Lower chest:  Lung bases clear Hepatobiliary: Liver normal appearance. Small fold at gallbladder fundus, gallbladder otherwise unremarkable. Prominent CBD 10 mm diameter on coronal images. No significant intrahepatic biliary dilatation. Pancreas: Somewhat ill defined low-attenuation mass at head/uncinate of pancreas, 3.1 x 2.0 x 2.6 cm in size highly suspicious for a pancreatic neoplasm. Preserved fat planes between the pancreatic mass, SMA, and SMV. Mass abuts the second and third portions of duodenum. Question loss of fat plane between the posterior aspect of mass and IVC. No pancreatic ductal dilatation or  atrophy. No pancreatic calcifications or additional fluid collections. Spleen: Normal appearance Adrenals/Urinary Tract: Adrenal glands normal appearance. Tiny RIGHT renal cyst. Kidneys, ureters, and bladder otherwise normal appearance. Stomach/Bowel: Normal appendix. Stomach and bowel loops normal appearance. Vascular/Lymphatic: Atherosclerotic calcifications aorta and iliac arteries. Coronary arterial calcifications. No definite adenopathy. Reproductive: N/A Other: Nodular irregularity of the anterior abdominal wall at the umbilicus question prior umblical hernia repair. Tiny LEFT parasagittal periumbilical hernia. No free air or free fluid. Musculoskeletal: Osseous structures unremarkable. IMPRESSION: Mildly dilated CBD 10 mm diameter without intrahepatic biliary dilatation. 3.1 x 2.0 x 2.6 cm diameter mass at head/uncinate process of pancreas highly suspicious for pancreatic neoplasm. Findings called to Dr. Tomi Bamberger on 03/12/2016 at 1353 hours. Electronically Signed   By: Lavonia Dana M.D.   On: 03/12/2016 13:54    Procedures Procedures (including critical care time)  Medications Ordered in ED Medications  iopamidol (ISOVUE-300) 61 % injection 100 mL (100 mLs Intravenous Contrast Given 03/12/16 1259)     Initial Impression / Assessment and Plan / ED Course  I have reviewed the triage vital signs and the nursing notes.  Pertinent labs & imaging results that were available during my care of the patient were reviewed by me and considered in my medical decision making (see chart for details).  Clinical Course  Value Comment By Time  Creatinine: (!) 1.37 elevated Dorie Rank, MD 08/02 1100   Elevated lfts and hyperbilirubinemia.    [place] abnormal.  Concerning for possible obstructive lesion causing the pancreatitis and hyperbilirubinemia.  CT scan ordered Dorie Rank, MD 08/02 1346    The patient presented to the emergency room with recurrent episodes of abdominal and chest pain.  Patient's LFTs showed  significant hyperbilirubinemia. An elevated lipase. Ultrasound did not show gallstones but suggests the possibility of a pancreatic mass. Unfortunately the CT scan does suggest a mass in the pancreas suspicious for pancreatic cancer.  I will consult the medical service for admission for further evaluation, GI consultation.  Final Clinical Impressions(s) / ED Diagnoses   Final diagnoses:  Pancreatic mass      Dorie Rank, MD 03/12/16 1506

## 2016-03-12 NOTE — ED Notes (Signed)
Wife states that pt drinks at least 8-9 beers a day with liquor also. Has been drinking heavily "for years" --

## 2016-03-12 NOTE — ED Notes (Signed)
Pt here with epigastric pain--was called by private MD

## 2016-03-12 NOTE — ED Triage Notes (Addendum)
Pt reports recurrent chest pain X1 month. Seeing PCP who continues to run tests but CP has gotten worse. No distress noted. PCP thinks it could be his gallbladder and wanted pt to have an ultrasound.

## 2016-03-12 NOTE — ED Notes (Signed)
Pt states that dr called and told him that his liver function test was abnormal-- pain in chest started 1 month ago in center of chest, after starting new diabetes medicine 1 month ago-- worse today with resp, denies nausea and vomiting.  Pain not affected with movement, but activity does make pt feel short of breath.

## 2016-03-13 ENCOUNTER — Observation Stay (HOSPITAL_COMMUNITY): Payer: BLUE CROSS/BLUE SHIELD | Admitting: Anesthesiology

## 2016-03-13 ENCOUNTER — Encounter (HOSPITAL_COMMUNITY)
Admission: EM | Disposition: A | Discharge status: Home / Self Care | Payer: Self-pay | Source: Home / Self Care | Attending: Emergency Medicine

## 2016-03-13 ENCOUNTER — Encounter (HOSPITAL_COMMUNITY): Payer: Self-pay

## 2016-03-13 ENCOUNTER — Observation Stay (HOSPITAL_COMMUNITY): Payer: BLUE CROSS/BLUE SHIELD

## 2016-03-13 DIAGNOSIS — R7989 Other specified abnormal findings of blood chemistry: Secondary | ICD-10-CM | POA: Diagnosis not present

## 2016-03-13 DIAGNOSIS — K869 Disease of pancreas, unspecified: Secondary | ICD-10-CM | POA: Diagnosis not present

## 2016-03-13 DIAGNOSIS — F1010 Alcohol abuse, uncomplicated: Secondary | ICD-10-CM

## 2016-03-13 DIAGNOSIS — N179 Acute kidney failure, unspecified: Secondary | ICD-10-CM | POA: Diagnosis not present

## 2016-03-13 HISTORY — PX: ERCP: SHX5425

## 2016-03-13 LAB — COMPREHENSIVE METABOLIC PANEL
ALT: 743 U/L — ABNORMAL HIGH (ref 17–63)
AST: 311 U/L — ABNORMAL HIGH (ref 15–41)
Albumin: 3.1 g/dL — ABNORMAL LOW (ref 3.5–5.0)
Alkaline Phosphatase: 637 U/L — ABNORMAL HIGH (ref 38–126)
Anion gap: 11 (ref 5–15)
BUN: 19 mg/dL (ref 6–20)
CHLORIDE: 100 mmol/L — AB (ref 101–111)
CO2: 23 mmol/L (ref 22–32)
CREATININE: 1.21 mg/dL (ref 0.61–1.24)
Calcium: 9.7 mg/dL (ref 8.9–10.3)
Glucose, Bld: 227 mg/dL — ABNORMAL HIGH (ref 65–99)
POTASSIUM: 3.8 mmol/L (ref 3.5–5.1)
Sodium: 134 mmol/L — ABNORMAL LOW (ref 135–145)
Total Bilirubin: 7.1 mg/dL — ABNORMAL HIGH (ref 0.3–1.2)
Total Protein: 7 g/dL (ref 6.5–8.1)

## 2016-03-13 LAB — CBC
HCT: 41 % (ref 39.0–52.0)
Hemoglobin: 13.3 g/dL (ref 13.0–17.0)
MCH: 26.5 pg (ref 26.0–34.0)
MCHC: 32.4 g/dL (ref 30.0–36.0)
MCV: 81.7 fL (ref 78.0–100.0)
PLATELETS: 243 10*3/uL (ref 150–400)
RBC: 5.02 MIL/uL (ref 4.22–5.81)
RDW: 13.8 % (ref 11.5–15.5)
WBC: 6.2 10*3/uL (ref 4.0–10.5)

## 2016-03-13 LAB — GLUCOSE, CAPILLARY
GLUCOSE-CAPILLARY: 120 mg/dL — AB (ref 65–99)
Glucose-Capillary: 174 mg/dL — ABNORMAL HIGH (ref 65–99)
Glucose-Capillary: 143 mg/dL — ABNORMAL HIGH (ref 65–99)
Glucose-Capillary: 129 mg/dL — ABNORMAL HIGH (ref 65–99)
Glucose-Capillary: 160 mg/dL — ABNORMAL HIGH (ref 65–99)

## 2016-03-13 LAB — HEMOGLOBIN A1C
HEMOGLOBIN A1C: 10.3 % — AB (ref 4.8–5.6)
MEAN PLASMA GLUCOSE: 249 mg/dL

## 2016-03-13 SURGERY — ERCP, WITH INTERVENTION IF INDICATED
Anesthesia: General | Laterality: Left

## 2016-03-13 MED ORDER — PROMETHAZINE HCL 25 MG/ML IJ SOLN: 6.2500 mg | INTRAMUSCULAR | Status: DC | PRN

## 2016-03-13 MED ORDER — SUCCINYLCHOLINE CHLORIDE 20 MG/ML IJ SOLN
INTRAMUSCULAR | Status: DC | PRN
  Administered 2016-03-13: 100 mg via INTRAVENOUS

## 2016-03-13 MED ORDER — MIDAZOLAM HCL 2 MG/2ML IJ SOLN
INTRAMUSCULAR | Status: AC
  Filled 2016-03-13: qty 2

## 2016-03-13 MED ORDER — PHENYLEPHRINE HCL 10 MG/ML IJ SOLN
INTRAMUSCULAR | Status: DC | PRN
  Administered 2016-03-13: 80 ug via INTRAVENOUS
  Administered 2016-03-13 (×2): 120 ug via INTRAVENOUS
  Administered 2016-03-13: 200 ug via INTRAVENOUS
  Administered 2016-03-13: 160 ug via INTRAVENOUS
  Administered 2016-03-13: 120 ug via INTRAVENOUS
  Administered 2016-03-13: 200 ug via INTRAVENOUS

## 2016-03-13 MED ORDER — INSULIN GLARGINE 100 UNIT/ML ~~LOC~~ SOLN
10.0000 [IU] | Freq: Every day | SUBCUTANEOUS | Status: DC
  Administered 2016-03-13 – 2016-03-14 (×2): 10 [IU] via SUBCUTANEOUS
  Filled 2016-03-13 (×2): qty 0.1

## 2016-03-13 MED ORDER — PHENYLEPHRINE HCL 10 MG/ML IJ SOLN
INTRAVENOUS | Status: DC | PRN
  Administered 2016-03-13: 60 ug/min via INTRAVENOUS

## 2016-03-13 MED ORDER — CIPROFLOXACIN IN D5W 400 MG/200ML IV SOLN
INTRAVENOUS | Status: DC | PRN
  Administered 2016-03-13: 400 mg via INTRAVENOUS

## 2016-03-13 MED ORDER — IOPAMIDOL (ISOVUE-300) INJECTION 61%
INTRAVENOUS | Status: AC
  Filled 2016-03-13: qty 50

## 2016-03-13 MED ORDER — CIPROFLOXACIN IN D5W 400 MG/200ML IV SOLN
INTRAVENOUS | Status: AC
  Filled 2016-03-13: qty 200

## 2016-03-13 MED ORDER — LIDOCAINE HCL (CARDIAC) 20 MG/ML IV SOLN
INTRAVENOUS | Status: DC | PRN
  Administered 2016-03-13: 80 mg via INTRAVENOUS

## 2016-03-13 MED ORDER — PROPOFOL 10 MG/ML IV BOLUS
INTRAVENOUS | Status: DC | PRN
  Administered 2016-03-13: 200 mg via INTRAVENOUS

## 2016-03-13 MED ORDER — LACTATED RINGERS IV SOLN
INTRAVENOUS | Status: DC | PRN
  Administered 2016-03-13 (×2): via INTRAVENOUS

## 2016-03-13 MED ORDER — FENTANYL CITRATE (PF) 100 MCG/2ML IJ SOLN
INTRAMUSCULAR | Status: DC | PRN
  Administered 2016-03-13 (×2): 50 ug via INTRAVENOUS
  Administered 2016-03-13: 100 ug via INTRAVENOUS
  Administered 2016-03-13: 50 ug via INTRAVENOUS

## 2016-03-13 MED ORDER — FENTANYL CITRATE (PF) 100 MCG/2ML IJ SOLN
25.0000 ug | INTRAMUSCULAR | Status: DC | PRN
  Administered 2016-03-13: 50 ug via INTRAVENOUS

## 2016-03-13 MED ORDER — GLUCAGON HCL RDNA (DIAGNOSTIC) 1 MG IJ SOLR
INTRAMUSCULAR | Status: AC
  Filled 2016-03-13: qty 1

## 2016-03-13 MED ORDER — FENTANYL CITRATE (PF) 100 MCG/2ML IJ SOLN
INTRAMUSCULAR | Status: AC
  Administered 2016-03-13: 50 ug
  Filled 2016-03-13: qty 2

## 2016-03-13 MED ORDER — FENTANYL CITRATE (PF) 250 MCG/5ML IJ SOLN
INTRAMUSCULAR | Status: AC
  Filled 2016-03-13: qty 5

## 2016-03-13 MED ORDER — MIDAZOLAM HCL 5 MG/5ML IJ SOLN
INTRAMUSCULAR | Status: DC | PRN
  Administered 2016-03-13: 2 mg via INTRAVENOUS

## 2016-03-13 NOTE — Anesthesia Postprocedure Evaluation (Signed)
Anesthesia Post Note  Patient: Craig [name]  Procedure(s) Performed: Procedure(s) (LRB): ENDOSCOPIC RETROGRADE CHOLANGIOPANCREATOGRAPHY (ERCP) (Left)  Patient location during evaluation: PACU Anesthesia Type: General Level of consciousness: awake and alert and patient cooperative Pain management: pain level controlled Vital Signs Assessment: post-procedure vital signs reviewed and stable Respiratory status: spontaneous breathing and respiratory function stable Cardiovascular status: stable Anesthetic complications: no    Last Vitals:  Vitals:   03/13/16 1915 03/13/16 1930  BP: 108/74 104/68  Pulse: 95 93  Resp: 13 16  Temp:      Last Pain:  Vitals:   03/13/16 1934  TempSrc:   PainSc: [name]

## 2016-03-13 NOTE — Op Note (Signed)
[facility] Patient Name: Craig [name] Procedure Date : 03/13/2016 MRN: [id number] Attending MD: Carol Ada , MD Date of Birth: 09-01-1955 CSN: [id number] Age: 60 Admit Type: Inpatient Procedure:                ERCP Indications:              Malignant tumor of the head of pancreas Providers:                Carol Ada, MD, Dortha Schwalbe RN, RN,                            William Dalton, Technician Referring MD:              Medicines:                General Anesthesia Complications:            No immediate complications. Estimated Blood Loss:     Estimated blood loss was minimal. Procedure:                Pre-Anesthesia Assessment:                           - Prior to the procedure, a History and Physical                            was performed, and patient medications and                            allergies were reviewed. The patient's tolerance of                            previous anesthesia was also reviewed. The risks                            and benefits of the procedure and the sedation                            options and risks were discussed with the patient.                            All questions were answered, and informed consent                            was obtained. Prior Anticoagulants: The patient has                            taken no previous anticoagulant or antiplatelet                            agents. ASA Grade Assessment: III - A patient with                            severe systemic disease. After reviewing the risks  and benefits, the patient was deemed in                            satisfactory condition to undergo the procedure.                           - Sedation was administered by an anesthesia                            professional. General anesthesia was attained.                           After obtaining informed consent, the scope was                            passed under direct  vision. Throughout the                            procedure, the patient's blood pressure, pulse, and                            oxygen saturations were monitored continuously. The                            [id number]) scope was introduced through                            the mouth, and used to inject contrast into and                            used to inject contrast into the bile duct and                            ventral pancreatic duct. The ERCP was somewhat                            difficult. The patient tolerated the procedure well. Findings:      One 10 mm by 4 cm covered metal stent with no external flaps and no       internal flaps was placed 3 cm into the common bile duct. Bile flowed       through the stent. The stent was in good position. This was biopsied       with a cold forceps for histology.      In the second portion of the duodenum it was clear that the pancreatic       mass was invading into the lumen just distal to the ampulla. This large       area of mucosal abnormality was extremely friable and it distorted the       lumen. Some spontaneous bile drainage was noted. The ampulla was easily       cannulated, in spite of the luminal distortion, but the guidewire was       entering the PD. After the third attempt, the guidewire was advanced       into the PD and left in place.  A secondary guidewire was then employed       with good success. After several attempts the guidewire was easily       advanced into the CBD and secured in the right intrahepatic ducts.       Contrast injection revealed a dilated CBD to 10 - 12 mm. There was a       focal area of stricture in the distal CBD and this stenosis was 3 cm in       length. A 3-4 mm sphincterotomy was created, but not enlarged beyond       this size secondary to the friable surrounding mucosa. Over the       guidewire the 10 mm x 40 cm fully covered metallic stent was       successfully deployed under  fluoroscopic and gross visualization.       Excellent placement of the stent was achieved and a copious amount of       dark bile rapidly drained from the CBD. Cold biopsy forceps were then       used to obtain samples of the presumed mass invading into the duodenum. Impression:               - One covered metal stent was placed into the                            common bile duct. Recommendation:           - Return patient to hospital ward for ongoing care.                           - Follow liver panel.                           - Await biopsy results.                           - If the patient is stable, he can be discharged                            home.                           - Check Ca19-9 level. Procedure Code(s):        --- Professional ---                           [id number], Endoscopic retrograde                            cholangiopancreatography (ERCP); with placement of                            endoscopic stent into biliary or pancreatic duct,                            including pre- and post-dilation and guide wire                            passage, when performed, including sphincterotomy,  when performed, each stent                           [id number], Endoscopic retrograde                            cholangiopancreatography (ERCP); with biopsy,                            single or multiple Diagnosis Code(s):        --- Professional ---                           C25.0, Malignant neoplasm of head of pancreas CPT copyright 2016 [employer]. All rights reserved. The codes documented in this report are preliminary and upon coder review may  be revised to meet current compliance requirements. Carol Ada, MD Carol Ada, MD 03/13/2016 6:45:13 PM This report has been signed electronically. Number of Addenda: 0

## 2016-03-13 NOTE — Interval H&P Note (Signed)
History and Physical Interval Note:  03/13/2016 5:33 PM  Craig [name]  has presented today for surgery, with the diagnosis of Pancreatic head mass/abnormal CT scan-abnormal LFT's.  The various methods of treatment have been discussed with the patient and family. After consideration of risks, benefits and other options for treatment, the patient has consented to  Procedure(s): ENDOSCOPIC RETROGRADE CHOLANGIOPANCREATOGRAPHY (ERCP) (Left) as a surgical intervention .  The patient's history has been reviewed, patient examined, no change in status, stable for surgery.  I have reviewed the patient's chart and labs.  Questions were answered to the patient's satisfaction.     Laryssa Hassing D

## 2016-03-13 NOTE — Anesthesia Preprocedure Evaluation (Signed)
Anesthesia Evaluation  Patient identified by MRN, date of birth, ID band Patient awake    Reviewed: Allergy & Precautions, H&P , Patient's Chart, lab work & pertinent test results  History of Anesthesia Complications Negative for: history of anesthetic complications  Airway Mallampati: II  TM Distance: >3 FB Neck ROM: full    Dental no notable dental hx.    Pulmonary neg pulmonary ROS,    Pulmonary exam normal breath sounds clear to auscultation       Cardiovascular hypertension, Pt. on medications Normal cardiovascular exam Rhythm:regular Rate:Normal     Neuro/Psych negative neurological ROS     GI/Hepatic negative GI ROS, (+)     substance abuse  alcohol use,   Endo/Other  diabetes, Type 2  Renal/GU Renal disease     Musculoskeletal   Abdominal   Peds  Hematology negative hematology ROS (+)   Anesthesia Other Findings   Reproductive/Obstetrics negative OB ROS                             Anesthesia Physical Anesthesia Plan  ASA: III  Anesthesia Plan: General   Post-op Pain Management:    Induction: Intravenous and Rapid sequence  Airway Management Planned: Oral ETT  Additional Equipment:   Intra-op Plan:   Post-operative Plan:   Informed Consent: I have reviewed the patients History and Physical, chart, labs and discussed the procedure including the risks, benefits and alternatives for the proposed anesthesia with the patient or authorized representative who has indicated his/her understanding and acceptance.   Dental Advisory Given  Plan Discussed with: Anesthesiologist, CRNA and Surgeon  Anesthesia Plan Comments:         Anesthesia Quick Evaluation

## 2016-03-13 NOTE — Transfer of Care (Signed)
Immediate Anesthesia Transfer of Care Note  Patient: Craig [name]  Procedure(s) Performed: Procedure(s): ENDOSCOPIC RETROGRADE CHOLANGIOPANCREATOGRAPHY (ERCP) (Left)  Patient Location: PACU  Anesthesia Type:General  Level of Consciousness: awake and patient cooperative  Airway & Oxygen Therapy: Patient Spontanous Breathing and Patient connected to nasal cannula oxygen  Post-op Assessment: Report given to RN and Post -op Vital signs reviewed and stable  Post vital signs: Reviewed and stable  Last Vitals:  Vitals:   03/13/16 1648 03/13/16 1845  BP: 97/69 107/71  Pulse: 89 95  Resp: 15 20  Temp: 36.5 C 36.7 C    Last Pain:  Vitals:   03/13/16 1648  TempSrc: Oral  PainSc:          Complications: No apparent anesthesia complications

## 2016-03-13 NOTE — Progress Notes (Signed)
Patient ID: Craig [name], male   DOB: [birth year], 58 y.o.   MRN: [id number]  PROGRESS NOTE    Craig [name]  [id number] DOB: [birth year] DOA: 03/12/2016  PCP: Jilda Panda, MD   Brief Narrative:  60 year old male who presented to [facility] with reports of epigastric and chest discomfort for past month or so prior to this admission. Patient also reported subjective weight loss of about 10 pounds in last few weeks prior to this admission. No reports of blood in the stool or urine. No nausea or vomiting or abdominal pain. He has been seen primary care physician for this problem but no definite diagnosis was made until the day of the admission when he was found to have abnormal liver function enzymes and was sent to hospital for further evaluation. Patient was subsequently found to have pancreatic mass based on CT scan suspicious for neoplasm. His common bile duct mea  Assessment & Plan:   Active Problems:   Pancreatic mass  - Seen on CT scan 3.1 x 2.0 x 2.6 cm at head/uncinate process of pancreas suspiciosu fo rneoplasm - Appreciate GI recommendations - He is NPO for biopsy     Dilated CBD / Abnormal LFT's - Plan for ERCP tomorrow per GI    AKI (acute kidney injury) ([place]) - Likely prerenal, dehydration - Normalized with IV fluids    Diabetes mellitus, uncontrolled without complications with long term insulin use - Continue sliding scale insulin - Patient is on Lantus 22 units daily, will resume 10 units at this time since he is not eating very well. Will titrate up depending on CBG's - A1c on this admission 10.3 indicating poor glycemic control, likely exacerbated by pancreatic related issues     ETOH abuse - Continue multivitamin, folic acid, thiamine   DVT prophylaxis: SCD's bilaterally  Code Status: full code  Family Communication: Wife at the bedside Disposition Plan: Home once cleared by GI   Consultants:   GI, Dr. Tyrone Sage   Procedures:   None    Antimicrobials:   None    Subjective: Feels okay, no vomiting.   Objective: Vitals:   03/12/16 1750 03/12/16 1825 03/12/16 1957 03/13/16 0448  BP: 108/81 119/77 102/66 111/70  Pulse: 101 97 (!) 115 92  Resp:   18 17  Temp:  98.2 F (36.8 C) 97.9 F (36.6 C) 98.8 F (37.1 C)  TempSrc:  Oral Oral Oral  SpO2:  100% 100% 96%  Weight:   90 kg (198 lb 6.4 oz)   Height:   5\' 11"  (1.803 m)     Intake/Output Summary (Last 24 hours) at 03/13/16 1033 Last data filed at 03/13/16 0721  Gross per 24 hour  Intake              240 ml  Output              625 ml  Net             -385 ml   Filed Weights   03/12/16 1957  Weight: 90 kg (198 lb 6.4 oz)    Examination:  General exam: Appears calm and comfortable  Respiratory system: Clear to auscultation. Respiratory effort normal. Cardiovascular system: S1 & S2 heard, RRR. No JVD, murmurs, rubs, gallops or clicks. No pedal edema. Gastrointestinal system: Abdomen is nondistended, soft and nontender. Normal bowel sounds heard. Central nervous system: Alert and oriented. No focal neurological deficits. Extremities: Symmetric 5 x 5 power. Skin: No rashes, lesions or  ulcers Psychiatry: Judgement and insight appear normal. Mood & affect appropriate.   Data Reviewed: I have personally reviewed following labs and imaging studies  CBC:  Recent Labs Lab 03/12/16 0933 03/13/16 0320  WBC 7.1 6.2  HGB 14.3 13.3  HCT 43.0 41.0  MCV 82.2 81.7  PLT 282 0000000   Basic Metabolic Panel:  Recent Labs Lab 03/12/16 0933 03/13/16 0320  NA 136 134*  K 3.8 3.8  CL 101 100*  CO2 21* 23  GLUCOSE 184* 227*  BUN 17 19  CREATININE 1.37* 1.21  CALCIUM 9.8 9.7   GFR: Estimated Creatinine Clearance: 70.9 mL/min (by C-G formula based on SCr of 1.21 mg/dL). Liver Function Tests:  Recent Labs Lab 03/12/16 0933 03/13/16 0320  AST 414* 311*  ALT 890* 743*  ALKPHOS 679* 637*  BILITOT 7.3* 7.1*  PROT 7.8 7.0  ALBUMIN 3.5 3.1*     Recent Labs Lab 03/12/16 0933  LIPASE 180*   No results for input(s): AMMONIA in the last 168 hours. Coagulation Profile: No results for input(s): INR, PROTIME in the last 168 hours. Cardiac Enzymes: No results for input(s): CKTOTAL, CKMB, CKMBINDEX, TROPONINI in the last 168 hours. BNP (last 3 results) No results for input(s): PROBNP in the last 8760 hours. HbA1C:  Recent Labs  03/12/16 0933  HGBA1C 10.3*   CBG:  Recent Labs Lab 03/12/16 2147 03/13/16 0610  GLUCAP 332* 174*   Lipid Profile: No results for input(s): CHOL, HDL, LDLCALC, TRIG, CHOLHDL, LDLDIRECT in the last 72 hours. Thyroid Function Tests: No results for input(s): TSH, T4TOTAL, FREET4, T3FREE, THYROIDAB in the last 72 hours. Anemia Panel: No results for input(s): VITAMINB12, FOLATE, FERRITIN, TIBC, IRON, RETICCTPCT in the last 72 hours. Urine analysis: No results found for: COLORURINE, APPEARANCEUR, LABSPEC, PHURINE, GLUCOSEU, HGBUR, BILIRUBINUR, KETONESUR, PROTEINUR, UROBILINOGEN, NITRITE, LEUKOCYTESUR Sepsis Labs: @LABRCNTIP (procalcitonin:4,lacticidven:4)   )No results found for this or any previous visit (from the past 240 hour(s)).    Radiology Studies: Dg Chest 2 View Result Date: 03/12/2016 CLINICAL DATA:   No edema or consolidation. Electronically Signed   By: Lowella Grip III M.D.   On: 03/12/2016 10:10   US Abdomen Complete Result Date: 03/12/2016 CLINICAL DATA:   Intra and extrahepatic biliary ductal dilatation is present, along with abnormal dilatation of the pancreatic duct. CBD up to 8 mm and PD approximately 4 mm. Concern for ampullary mass or stone. CT abdomen and pelvis with contrast recommended for further evaluation. Electronically Signed   By: Staci Righter M.D.   On: 03/12/2016 12:22   Ct Abdomen Pelvis W Contrast Result Date: 03/12/2016 CLINICAL DATA:  Mildly dilated CBD 10 mm diameter without intrahepatic biliary dilatation. 3.1 x 2.0 x 2.6 cm diameter mass at  head/uncinate process of pancreas highly suspicious for pancreatic neoplasm. Findings called to Dr. Tomi Bamberger on 03/12/2016 at 1353 hours. Electronically Signed   By: Lavonia Dana M.D.   On: 03/12/2016 13:54     Scheduled Meds: . folic acid  1 mg Oral Daily  . insulin aspart  0-15 Units Subcutaneous TID WC  . multivitamin with minerals  1 tablet Oral Daily  . thiamine  100 mg Oral Daily   Or  . thiamine  100 mg Intravenous Daily   Continuous Infusions: . sodium chloride    . sodium chloride 75 mL/hr at 03/12/16 2300  . sodium chloride       LOS: 0 days    Time spent: 25 minutes  Greater than 50% of the time spent  on counseling and coordinating the care.   Leisa Lenz, MD [facility] Pager [phone]  If 7PM-7AM, please contact night-coverage [email or web address] Password [facility] 03/13/2016, 10:33 AM

## 2016-03-13 NOTE — Anesthesia Procedure Notes (Signed)
Procedure Name: Intubation Date/Time: 03/13/2016 5:39 PM Performed by: Lance Coon Pre-anesthesia Checklist: Patient identified, Emergency Drugs available, Suction available, Patient being monitored and Timeout performed Patient Re-evaluated:Patient Re-evaluated prior to inductionOxygen Delivery Method: Circle system utilized Preoxygenation: Pre-oxygenation with 100% oxygen Intubation Type: IV induction and Rapid sequence Laryngoscope Size: Miller and 2 Grade View: Grade I Tube type: Oral Tube size: 7.5 mm Number of attempts: 1 Airway Equipment and Method: Stylet Placement Confirmation: ETT inserted through vocal cords under direct vision,  positive ETCO2 and breath sounds checked- equal and bilateral Secured at: 21 cm Tube secured with: Tape Dental Injury: Teeth and Oropharynx as per pre-operative assessment

## 2016-03-13 NOTE — H&P (View-Only) (Signed)
Reason for Consult: Abnormal LFT's/pancreatic mass. Referring Physician: THP  Craig [name] is an 60 y.o. male.  HPI: Craig [name] is a 60 year old black male, who was in his usual state of health till about a month ago, when he developed some epigastric and chest discomfort. Patient claims he has seen his PCP on several occasions for this problem but no definite diagnosis was made to today when he was told he had abnormal LFTs and was sent to the hospital for further evaluation. He was found to have a pancreatic mass on evaluation in the ER after he had a CT scan nd was hospitalized for further diagnosis and treatment. The CT scan done today revealed a mass in the pancreatic head/uncinate process measuring 3.1 x 2 x 2.6 cm in size with the CBD measuring 10 mm. He was told by his wife that he his eyes appeared yellow and over the last month he's noted darker urine. He has lost about 10 pounds in the last couple weeks. His appetite is fair. He denies having any melena or hematochezia he has a problem with occasional constipation. There is no history of abdominal pain nausea vomiting dysphagia or odynophagia. He is up-to-date on his colonoscopy which was done a couple of years ago by Dr. Carol [name]. Past Medical History:  Diagnosis Date  . Diabetes mellitus without complication ([place])   . ETOH abuse   . Hypertension    Past Surgical History:  Procedure Laterality Date  . HERNIA REPAIR    . KNEE ARTHROSCOPY     left and right   Family History  Problem Relation Age of Onset  . Diabetes Mother    Social History:  reports that he has never smoked. He has never used smokeless tobacco. He reports that he drinks alcohol. He admits to drinking about 6 beers per night for the last 15 years  He reports that he does not use drugs. He works in the produce department" and [employer] he is married and lives with his wife here in [place]. Allergies: No Known Allergies  Medications: I  have reviewed the patient's current medications.  Results for orders placed or performed during the hospital encounter of 03/12/16 (from the past 48 hour(s))  Basic metabolic panel     Status: Abnormal   Collection Time: 03/12/16  9:33 AM  Result Value Ref Range   Sodium 136 135 - 145 mmol/L   Potassium 3.8 3.5 - 5.1 mmol/L   Chloride 101 101 - 111 mmol/L   CO2 21 (L) 22 - 32 mmol/L   Glucose, Bld 184 (H) 65 - 99 mg/dL   BUN 17 6 - 20 mg/dL   Creatinine, Ser 1.37 (H) 0.61 - 1.24 mg/dL   Calcium 9.8 8.9 - 10.3 mg/dL   GFR calc non Af Amer 55 (L) >60 mL/min   GFR calc Af Amer >60 >60 mL/min    Comment: (NOTE) The eGFR has been calculated using the CKD EPI equation. This calculation has not been validated in all clinical situations. eGFR's persistently <60 mL/min signify possible Chronic Kidney Disease.    Anion gap 14 5 - 15  CBC     Status: None   Collection Time: 03/12/16  9:33 AM  Result Value Ref Range   WBC 7.1 4.0 - 10.5 K/uL   RBC 5.23 4.22 - 5.81 MIL/uL   Hemoglobin 14.3 13.0 - 17.0 g/dL   HCT 43.0 39.0 - 52.0 %   MCV 82.2 78.0 - 100.0  fL   MCH 27.3 26.0 - 34.0 pg   MCHC 33.3 30.0 - 36.0 g/dL   RDW 13.8 11.5 - 15.5 %   Platelets 282 150 - 400 K/uL  Lipase, blood     Status: Abnormal   Collection Time: 03/12/16  9:33 AM  Result Value Ref Range   Lipase 180 (H) 11 - 51 U/L  Hepatic function panel     Status: Abnormal   Collection Time: 03/12/16  9:33 AM  Result Value Ref Range   Total Protein 7.8 6.5 - 8.1 g/dL   Albumin 3.5 3.5 - 5.0 g/dL   AST 414 (H) 15 - 41 U/L   ALT 890 (H) 17 - 63 U/L   Alkaline Phosphatase 679 (H) 38 - 126 U/L   Total Bilirubin 7.3 (H) 0.3 - 1.2 mg/dL   Bilirubin, Direct 4.7 (H) 0.1 - 0.5 mg/dL   Indirect Bilirubin 2.6 (H) 0.3 - 0.9 mg/dL  I-stat troponin, ED     Status: None   Collection Time: 03/12/16  9:54 AM  Result Value Ref Range   Troponin i, poc 0.00 0.00 - 0.08 ng/mL   Comment 3            Comment: Due to the release kinetics  of cTnI, a negative result within the first hours of the onset of symptoms does not rule out myocardial infarction with certainty. If myocardial infarction is still suspected, repeat the test at appropriate intervals.     Dg Chest 2 View  Result Date: 03/12/2016 CLINICAL DATA:  Chest and epigastric region pain EXAM: CHEST  2 VIEW COMPARISON:  None. FINDINGS: Lungs are clear. Heart size and pulmonary vascularity are normal. No adenopathy. There is degenerative change in the thoracic spine. IMPRESSION: No edema or consolidation. Electronically Signed   By: Lowella Grip III M.D.   On: 03/12/2016 10:10   US Abdomen Complete  Result Date: 03/12/2016 CLINICAL DATA:  Chest pain.  Abnormal LFTs. EXAM: ABDOMEN ULTRASOUND COMPLETE COMPARISON:  None. FINDINGS: Gallbladder: No gallstones or wall thickening visualized. No sonographic Murphy sign noted by sonographer. Moderate distention, normal wall thickness. Common bile duct: Diameter: Increased, between 7.3 and 7.9 mm. Liver: No focal lesion identified. Within normal limits in parenchymal echogenicity. Intrahepatic biliary ductal dilatation is present. IVC: No abnormality visualized. Pancreas: Increased size of the pancreatic duct, up to 4 mm. No mass is evident. Spleen: Size and appearance within normal limits. Right Kidney: Length: 9.9 cm. Echogenicity within normal limits. No mass or hydronephrosis visualized. Left Kidney: Length: 11.8 cm. Echogenicity within normal limits. No mass or hydronephrosis visualized. Two 1 cm simple cysts are noted in the kidney, non worrisome Abdominal aorta: No aneurysm visualized. Other findings: None. IMPRESSION: Intra and extrahepatic biliary ductal dilatation is present, along with abnormal dilatation of the pancreatic duct. CBD up to 8 mm and PD approximately 4 mm. Concern for ampullary mass or stone. CT abdomen and pelvis with contrast recommended for further evaluation. Electronically Signed   By: Staci Righter M.D.    On: 03/12/2016 12:22   Ct Abdomen Pelvis W Contrast  Result Date: 03/12/2016 CLINICAL DATA:  Epigastric pain off and on for 1 month, abnormal bile duct by ultrasound EXAM: CT ABDOMEN AND PELVIS WITH CONTRAST TECHNIQUE: Multidetector CT imaging of the abdomen and pelvis was performed using the standard protocol following bolus administration of intravenous contrast. Sagittal and coronal MPR images reconstructed from axial data set. CONTRAST:  112m ISOVUE-300 IOPAMIDOL (ISOVUE-300) INJECTION 61% IV. No oral contrast  administered. COMPARISON:  None; correlation ultrasound abdomen 03/12/2016 FINDINGS: Lower chest:  Lung bases clear Hepatobiliary: Liver normal appearance. Small fold at gallbladder fundus, gallbladder otherwise unremarkable. Prominent CBD 10 mm diameter on coronal images. No significant intrahepatic biliary dilatation. Pancreas: Somewhat ill defined low-attenuation mass at head/uncinate of pancreas, 3.1 x 2.0 x 2.6 cm in size highly suspicious for a pancreatic neoplasm. Preserved fat planes between the pancreatic mass, SMA, and SMV. Mass abuts the second and third portions of duodenum. Question loss of fat plane between the posterior aspect of mass and IVC. No pancreatic ductal dilatation or atrophy. No pancreatic calcifications or additional fluid collections. Spleen: Normal appearance Adrenals/Urinary Tract: Adrenal glands normal appearance. Tiny RIGHT renal cyst. Kidneys, ureters, and bladder otherwise normal appearance. Stomach/Bowel: Normal appendix. Stomach and bowel loops normal appearance. Vascular/Lymphatic: Atherosclerotic calcifications aorta and iliac arteries. Coronary arterial calcifications. No definite adenopathy. Reproductive: N/A Other: Nodular irregularity of the anterior abdominal wall at the umbilicus question prior umblical hernia repair. Tiny LEFT parasagittal periumbilical hernia. No free air or free fluid. Musculoskeletal: Osseous structures unremarkable. IMPRESSION: Mildly  dilated CBD 10 mm diameter without intrahepatic biliary dilatation. 3.1 x 2.0 x 2.6 cm diameter mass at head/uncinate process of pancreas highly suspicious for pancreatic neoplasm. Findings called to Dr. Tomi Bamberger on 03/12/2016 at 1353 hours. Electronically Signed   By: Lavonia Dana M.D.   On: 03/12/2016 13:54   Review of Systems  Constitutional: Positive for malaise/fatigue and weight loss. Negative for chills and fever.  HENT: Negative.   Eyes: Negative.   Respiratory: Positive for shortness of breath. Negative for cough, hemoptysis, sputum production and wheezing.   Cardiovascular: Positive for chest pain. Negative for palpitations, orthopnea, claudication and PND.  Gastrointestinal: Negative for abdominal pain, blood in stool, diarrhea, heartburn, melena, nausea and vomiting.  Genitourinary: Negative.   Musculoskeletal: Negative.   Skin: Negative.   Neurological: Positive for weakness.  Endo/Heme/Allergies: Negative.   Psychiatric/Behavioral: Negative.    Blood pressure 119/77, pulse 97, temperature 98.2 F (36.8 C), temperature source Oral, resp. rate 21, SpO2 100 %. Physical Exam  Constitutional: He is oriented to person, place, and time. He appears well-developed and well-nourished.  HENT:  Head: Normocephalic and atraumatic.  Eyes: EOM are normal. Pupils are equal, round, and reactive to light. Scleral icterus is present.  Neck: Normal range of motion. Neck supple.  Cardiovascular: Normal rate and regular rhythm.   Respiratory: Effort normal and breath sounds normal.  GI: Soft. Bowel sounds are normal.  Musculoskeletal: Normal range of motion.  Neurological: He is alert and oriented to person, place, and time.  Skin: Skin is warm and dry.   Assessment/Plan: 1) Abnormal LFTs with a 3 cm mass in the pancreatic head/uncinate process with a dilated CBD at 10 mm-will plan a ERCP for tomorrow. 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 2) Alcohol abuse.  3)  Hypertension.  4) AODM.  Craig [name] 03/12/2016, 6:42 PM

## 2016-03-14 ENCOUNTER — Encounter (HOSPITAL_COMMUNITY): Payer: Self-pay | Admitting: Gastroenterology

## 2016-03-14 DIAGNOSIS — N179 Acute kidney failure, unspecified: Secondary | ICD-10-CM | POA: Diagnosis not present

## 2016-03-14 DIAGNOSIS — K869 Disease of pancreas, unspecified: Secondary | ICD-10-CM | POA: Diagnosis not present

## 2016-03-14 DIAGNOSIS — R7989 Other specified abnormal findings of blood chemistry: Secondary | ICD-10-CM | POA: Diagnosis not present

## 2016-03-14 LAB — COMPREHENSIVE METABOLIC PANEL
ALT: 557 U/L — AB (ref 17–63)
ANION GAP: 10 (ref 5–15)
AST: 156 U/L — ABNORMAL HIGH (ref 15–41)
Albumin: 2.8 g/dL — ABNORMAL LOW (ref 3.5–5.0)
Alkaline Phosphatase: 546 U/L — ABNORMAL HIGH (ref 38–126)
BUN: 17 mg/dL (ref 6–20)
CHLORIDE: 103 mmol/L (ref 101–111)
CO2: 22 mmol/L (ref 22–32)
Calcium: 9 mg/dL (ref 8.9–10.3)
Creatinine, Ser: 1.2 mg/dL (ref 0.61–1.24)
GFR calc non Af Amer: >60 mL/min (ref >60)
Glucose, Bld: 178 mg/dL — ABNORMAL HIGH (ref 65–99)
Potassium: 3.9 mmol/L (ref 3.5–5.1)
SODIUM: 135 mmol/L (ref 135–145)
Total Bilirubin: 2.8 mg/dL — ABNORMAL HIGH (ref 0.3–1.2)
Total Protein: 6.5 g/dL (ref 6.5–8.1)

## 2016-03-14 LAB — CBC
HCT: 38.2 % — ABNORMAL LOW (ref 39.0–52.0)
HEMOGLOBIN: 12.3 g/dL — AB (ref 13.0–17.0)
MCH: 26.7 pg (ref 26.0–34.0)
MCHC: 32.2 g/dL (ref 30.0–36.0)
MCV: 83 fL (ref 78.0–100.0)
Platelets: 208 10*3/uL (ref 150–400)
RBC: 4.6 MIL/uL (ref 4.22–5.81)
RDW: 13.9 % (ref 11.5–15.5)
WBC: 6.5 10*3/uL (ref 4.0–10.5)

## 2016-03-14 LAB — GLUCOSE, CAPILLARY
GLUCOSE-CAPILLARY: 188 mg/dL — AB (ref 65–99)
GLUCOSE-CAPILLARY: 277 mg/dL — AB (ref 65–99)

## 2016-03-14 MED ORDER — THIAMINE HCL 100 MG PO TABS: 100.0000 mg | ORAL_TABLET | Freq: Every day | ORAL | 0 refills | Status: DC

## 2016-03-14 MED ORDER — FOLIC ACID 1 MG PO TABS: 1.0000 mg | ORAL_TABLET | Freq: Every day | ORAL | 0 refills | Status: DC

## 2016-03-14 NOTE — Progress Notes (Signed)
Subjective: Some minor chest pain that he feels is secondary to the stent expansion.  Objective: Vital signs in last 24 hours: Temp:  [97.7 F (36.5 C)-98.2 F (36.8 C)] 98.2 F (36.8 C) (08/04 0455) Pulse Rate:  [83-101] 84 (08/04 0455) Resp:  [13-20] 18 (08/04 0010) BP: (97-132)/(59-79) 113/76 (08/04 0455) SpO2:  [95 %-100 %] 99 % (08/04 0455) Last BM Date:  (unk)  Intake/Output from previous day: 08/03 0701 - 08/04 0700 In: 1680 [P.O.:480; I.V.:1200] Out: 930 [Urine:925; Blood:5] Intake/Output this shift: No intake/output data recorded.  General appearance: alert and no distress GI: soft, non-tender; bowel sounds normal; no masses,  no organomegaly  Lab Results:  Recent Labs  03/12/16 0933 03/13/16 0320 03/14/16 0325  WBC 7.1 6.2 6.5  HGB 14.3 13.3 12.3*  HCT 43.0 41.0 38.2*  PLT 282 243 208   BMET  Recent Labs  03/12/16 0933 03/13/16 0320 03/14/16 0325  NA 136 134* 135  K 3.8 3.8 3.9  CL 101 100* 103  CO2 21* 23 22  GLUCOSE 184* 227* 178*  BUN 17 19 17   CREATININE 1.37* 1.21 1.20  CALCIUM 9.8 9.7 9.0   LFT  Recent Labs  03/12/16 0933  03/14/16 0325  PROT 7.8  < > 6.5  ALBUMIN 3.5  < > 2.8*  AST 414*  < > 156*  ALT 890*  < > 557*  ALKPHOS 679*  < > 546*  BILITOT 7.3*  < > 2.8*  BILIDIR 4.7*  --   --   IBILI 2.6*  --   --   < > = values in this interval not displayed. PT/INR No results for input(s): LABPROT, INR in the last 72 hours. Hepatitis Panel No results for input(s): HEPBSAG, HCVAB, HEPAIGM, HEPBIGM in the last 72 hours. C-Diff No results for input(s): CDIFFTOX in the last 72 hours. Fecal Lactopherrin No results for input(s): FECLLACTOFRN in the last 72 hours.  Studies/Results: Dg Chest 2 View  Result Date: 03/12/2016 CLINICAL DATA:  Chest and epigastric region pain EXAM: CHEST  2 VIEW COMPARISON:  None. FINDINGS: Lungs are clear. Heart size and pulmonary vascularity are normal. No adenopathy. There is degenerative change in the  thoracic spine. IMPRESSION: No edema or consolidation. Electronically Signed   By: Lowella Grip III M.D.   On: 03/12/2016 10:10   US Abdomen Complete  Result Date: 03/12/2016 CLINICAL DATA:  Chest pain.  Abnormal LFTs. EXAM: ABDOMEN ULTRASOUND COMPLETE COMPARISON:  None. FINDINGS: Gallbladder: No gallstones or wall thickening visualized. No sonographic Murphy sign noted by sonographer. Moderate distention, normal wall thickness. Common bile duct: Diameter: Increased, between 7.3 and 7.9 mm. Liver: No focal lesion identified. Within normal limits in parenchymal echogenicity. Intrahepatic biliary ductal dilatation is present. IVC: No abnormality visualized. Pancreas: Increased size of the pancreatic duct, up to 4 mm. No mass is evident. Spleen: Size and appearance within normal limits. Right Kidney: Length: 9.9 cm. Echogenicity within normal limits. No mass or hydronephrosis visualized. Left Kidney: Length: 11.8 cm. Echogenicity within normal limits. No mass or hydronephrosis visualized. Two 1 cm simple cysts are noted in the kidney, non worrisome Abdominal aorta: No aneurysm visualized. Other findings: None. IMPRESSION: Intra and extrahepatic biliary ductal dilatation is present, along with abnormal dilatation of the pancreatic duct. CBD up to 8 mm and PD approximately 4 mm. Concern for ampullary mass or stone. CT abdomen and pelvis with contrast recommended for further evaluation. Electronically Signed   By: Staci Righter M.D.   On: 03/12/2016 12:22  Ct Abdomen Pelvis W Contrast  Result Date: 03/12/2016 CLINICAL DATA:  Epigastric pain off and on for 1 month, abnormal bile duct by ultrasound EXAM: CT ABDOMEN AND PELVIS WITH CONTRAST TECHNIQUE: Multidetector CT imaging of the abdomen and pelvis was performed using the standard protocol following bolus administration of intravenous contrast. Sagittal and coronal MPR images reconstructed from axial data set. CONTRAST:  175mL ISOVUE-300 IOPAMIDOL (ISOVUE-300)  INJECTION 61% IV. No oral contrast administered. COMPARISON:  None; correlation ultrasound abdomen 03/12/2016 FINDINGS: Lower chest:  Lung bases clear Hepatobiliary: Liver normal appearance. Small fold at gallbladder fundus, gallbladder otherwise unremarkable. Prominent CBD 10 mm diameter on coronal images. No significant intrahepatic biliary dilatation. Pancreas: Somewhat ill defined low-attenuation mass at head/uncinate of pancreas, 3.1 x 2.0 x 2.6 cm in size highly suspicious for a pancreatic neoplasm. Preserved fat planes between the pancreatic mass, SMA, and SMV. Mass abuts the second and third portions of duodenum. Question loss of fat plane between the posterior aspect of mass and IVC. No pancreatic ductal dilatation or atrophy. No pancreatic calcifications or additional fluid collections. Spleen: Normal appearance Adrenals/Urinary Tract: Adrenal glands normal appearance. Tiny RIGHT renal cyst. Kidneys, ureters, and bladder otherwise normal appearance. Stomach/Bowel: Normal appendix. Stomach and bowel loops normal appearance. Vascular/Lymphatic: Atherosclerotic calcifications aorta and iliac arteries. Coronary arterial calcifications. No definite adenopathy. Reproductive: N/A Other: Nodular irregularity of the anterior abdominal wall at the umbilicus question prior umblical hernia repair. Tiny LEFT parasagittal periumbilical hernia. No free air or free fluid. Musculoskeletal: Osseous structures unremarkable. IMPRESSION: Mildly dilated CBD 10 mm diameter without intrahepatic biliary dilatation. 3.1 x 2.0 x 2.6 cm diameter mass at head/uncinate process of pancreas highly suspicious for pancreatic neoplasm. Findings called to Dr. Tomi Bamberger on 03/12/2016 at 1353 hours. Electronically Signed   By: Lavonia Dana M.D.   On: 03/12/2016 13:54    Medications:  Scheduled: . folic acid  1 mg Oral Daily  . insulin aspart  0-15 Units Subcutaneous TID WC  . insulin glargine  10 Units Subcutaneous Daily  . multivitamin  with minerals  1 tablet Oral Daily  . thiamine  100 mg Oral Daily   Or  . thiamine  100 mg Intravenous Daily   Continuous: . 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 2) Biliary obstruction.   The patient is well after the stent placement.  His TB has markedly declined from the 7 range down to 2.9.  He appears less jaundiced and he states that his urine is lightened up.  I had a long discussion with the patient about the next steps.  I will await the biopsy and this may obviate the need for an EUS, unless, this procedure is requested by Surg Onc and/or Med Onc.  There is some question of fat plane loss between the IVC and the mass.  I strongly encouraged him to quit drinking all alcohol.  Plan: 1) Okay to D/C home from the GI standpoint. 2) I will call and arrange for all oncologic follow up once I have the biopsies.  I will obtain the biopsy results next week.  LOS: 0 days   Karinne Schmader D 03/14/2016, 7:54 AM

## 2016-03-14 NOTE — Discharge Summary (Signed)
Physician Discharge Summary  Craig [name] [id number] DOB: [birth year] DOA: 03/12/2016  PCP: Jilda Panda, MD  Admit date: 03/12/2016 Discharge date: 03/14/2016  Recommendations for Outpatient Follow-up:  1. No changes ine medication on discharge  2. Biospy results to be followed by GI and depending on the results he will be determined patient is to follow with in regards to management of pancreatic malignancy, surgery and oncology.   anBiospyDischarge Diagnoses:  Active Problems:   Pancreatic mass   Epigastric pain   AKI (acute kidney injury) ([place])   Diabetes mellitus with complication (HCC)   ETOH abuse   Sinus tachycardia ([place])    Discharge Condition: stable   Diet recommendation: as tolerated   History of present illness:  60 year old male who presented to [facility] with reports of epigastric and chest discomfort for past month or so prior to this admission. Patient also reported subjective weight loss of about 10 pounds in last few weeks prior to this admission. No reports of blood in the stool or urine. No nausea or vomiting or abdominal pain. He has been seen primary care physician for this problem but no definite diagnosis was made until the day of the admission when he was found to have abnormal liver function enzymes and was sent to hospital for further evaluation. Patient was subsequently found to have pancreatic mass based on CT scan suspicious for neoplasm. His common bile duct [facility] Course:   Assessment & Plan:   Active Problems:   Pancreatic mass invading into duodenum / Dilated CBD / Abnormal LFT's - Seen on CT scan 3.1 x 2.0 x 2.6 cm at head/uncinate process of pancreas suspiciosu fo rneoplasm - Appreciate GI recommendations - S/p ERCP 8/3 and stent placement - LFT's trending down  - Tolerates regular diet     AKI (acute kidney injury) ([place]) - Likely prerenal, dehydration - Normalized with IV fluids    Diabetes mellitus, uncontrolled  without complications with long term insulin use - Continue home insulin regimen  - A1c on this admission 10.3 indicating poor glycemic control, likely exacerbated by pancreatic related issues     ETOH abuse - Continue folic acid, thiamine   DVT prophylaxis: SCD's bilaterally  Code Status: full code  Family Communication: Wife at the bedside   Consultants:   GI, Dr. Tyrone Sage and Dr. Carol Ada   Procedures:   ERCP 03/13/2016 with stent placement   Antimicrobials:   None     Signed:  Leisa Lenz, MD  [facility] 03/14/2016, 10:14 AM  Pager #: [phone]  Time spent in minutes: less than 30 minutes    Discharge Exam: Vitals:   03/14/16 0010 03/14/16 0455  BP: 115/60 113/76  Pulse: (!) 101 84  Resp: 18   Temp: 98.2 F (36.8 C) 98.2 F (36.8 C)   Vitals:   03/13/16 2012 03/13/16 2116 03/14/16 0010 03/14/16 0455  BP: 107/74 132/79 115/60 113/76  Pulse: 96 100 (!) 101 84  Resp: 13 18 18    Temp: 97.7 F (36.5 C) 98 F (36.7 C) 98.2 F (36.8 C) 98.2 F (36.8 C)  TempSrc:  Oral Oral Oral  SpO2: 98% 100% 98% 99%  Weight:      Height:        General: Pt is alert, follows commands appropriately, not in acute distress Cardiovascular: Regular rate and rhythm, S1/S2 +, no murmurs Respiratory: Clear to auscultation bilaterally, no wheezing, no crackles, no rhonchi Abdominal: Soft, non tender, non distended, bowel sounds +,  no guarding Extremities: no edema, no cyanosis, pulses palpable bilaterally DP and PT Neuro: Grossly nonfocal  Discharge Instructions  Discharge Instructions    Call MD for:  difficulty breathing, headache or visual disturbances    Complete by:  As directed   Call MD for:  persistant dizziness or light-headedness    Complete by:  As directed   Call MD for:  persistant nausea and vomiting    Complete by:  As directed   Call MD for:  redness, tenderness, or signs of infection (pain, swelling, redness, odor or green/yellow  discharge around incision site)    Complete by:  As directed   Diet - low sodium heart healthy    Complete by:  As directed   Increase activity slowly    Complete by:  As directed       Medication List    TAKE these medications   folic acid 1 MG tablet Commonly known as:  FOLVITE Take 1 tablet (1 mg total) by mouth daily.   LANTUS SOLOSTAR 100 UNIT/ML Solostar Pen Generic drug:  Insulin Glargine Inject 22 Units into the skin every morning.   thiamine 100 MG tablet Take 1 tablet (100 mg total) by mouth daily.      Follow-up Information    MOREIRA,ROY, MD. Schedule an appointment as soon as possible for a visit in 1 week(s).   Specialty:  Internal Medicine Contact information: [id number] [place] [id number] [phone]            The results of significant diagnostics from this hospitalization (including imaging, microbiology, ancillary and laboratory) are listed below for reference.    Significant Diagnostic Studies: Dg Chest 2 View  Result Date: 03/12/2016 CLINICAL DATA:  Chest and epigastric region pain EXAM: CHEST  2 VIEW COMPARISON:  None. FINDINGS: Lungs are clear. Heart size and pulmonary vascularity are normal. No adenopathy. There is degenerative change in the thoracic spine. IMPRESSION: No edema or consolidation. Electronically Signed   By: Lowella Grip III M.D.   On: 03/12/2016 10:10   US Abdomen Complete  Result Date: 03/12/2016 CLINICAL DATA:  Chest pain.  Abnormal LFTs. EXAM: ABDOMEN ULTRASOUND COMPLETE COMPARISON:  None. FINDINGS: Gallbladder: No gallstones or wall thickening visualized. No sonographic Murphy sign noted by sonographer. Moderate distention, normal wall thickness. Common bile duct: Diameter: Increased, between 7.3 and 7.9 mm. Liver: No focal lesion identified. Within normal limits in parenchymal echogenicity. Intrahepatic biliary ductal dilatation is present. IVC: No abnormality visualized. Pancreas: Increased size of the pancreatic  duct, up to 4 mm. No mass is evident. Spleen: Size and appearance within normal limits. Right Kidney: Length: 9.9 cm. Echogenicity within normal limits. No mass or hydronephrosis visualized. Left Kidney: Length: 11.8 cm. Echogenicity within normal limits. No mass or hydronephrosis visualized. Two 1 cm simple cysts are noted in the kidney, non worrisome Abdominal aorta: No aneurysm visualized. Other findings: None. IMPRESSION: Intra and extrahepatic biliary ductal dilatation is present, along with abnormal dilatation of the pancreatic duct. CBD up to 8 mm and PD approximately 4 mm. Concern for ampullary mass or stone. CT abdomen and pelvis with contrast recommended for further evaluation. Electronically Signed   By: Staci Righter M.D.   On: 03/12/2016 12:22   Ct Abdomen Pelvis W Contrast  Result Date: 03/12/2016 CLINICAL DATA:  Epigastric pain off and on for 1 month, abnormal bile duct by ultrasound EXAM: CT ABDOMEN AND PELVIS WITH CONTRAST TECHNIQUE: Multidetector CT imaging of the abdomen and pelvis was performed using  the standard protocol following bolus administration of intravenous contrast. Sagittal and coronal MPR images reconstructed from axial data set. CONTRAST:  167mL ISOVUE-300 IOPAMIDOL (ISOVUE-300) INJECTION 61% IV. No oral contrast administered. COMPARISON:  None; correlation ultrasound abdomen 03/12/2016 FINDINGS: Lower chest:  Lung bases clear Hepatobiliary: Liver normal appearance. Small fold at gallbladder fundus, gallbladder otherwise unremarkable. Prominent CBD 10 mm diameter on coronal images. No significant intrahepatic biliary dilatation. Pancreas: Somewhat ill defined low-attenuation mass at head/uncinate of pancreas, 3.1 x 2.0 x 2.6 cm in size highly suspicious for a pancreatic neoplasm. Preserved fat planes between the pancreatic mass, SMA, and SMV. Mass abuts the second and third portions of duodenum. Question loss of fat plane between the posterior aspect of mass and IVC. No  pancreatic ductal dilatation or atrophy. No pancreatic calcifications or additional fluid collections. Spleen: Normal appearance Adrenals/Urinary Tract: Adrenal glands normal appearance. Tiny RIGHT renal cyst. Kidneys, ureters, and bladder otherwise normal appearance. Stomach/Bowel: Normal appendix. Stomach and bowel loops normal appearance. Vascular/Lymphatic: Atherosclerotic calcifications aorta and iliac arteries. Coronary arterial calcifications. No definite adenopathy. Reproductive: N/A Other: Nodular irregularity of the anterior abdominal wall at the umbilicus question prior umblical hernia repair. Tiny LEFT parasagittal periumbilical hernia. No free air or free fluid. Musculoskeletal: Osseous structures unremarkable. IMPRESSION: Mildly dilated CBD 10 mm diameter without intrahepatic biliary dilatation. 3.1 x 2.0 x 2.6 cm diameter mass at head/uncinate process of pancreas highly suspicious for pancreatic neoplasm. Findings called to Dr. Tomi Bamberger on 03/12/2016 at 1353 hours. Electronically Signed   By: Lavonia Dana M.D.   On: 03/12/2016 13:54    Microbiology: No results found for this or any previous visit (from the past 240 hour(s)).   Labs: Basic Metabolic Panel:  Recent Labs Lab 03/12/16 0933 03/13/16 0320 03/14/16 0325  NA 136 134* 135  K 3.8 3.8 3.9  CL 101 100* 103  CO2 21* 23 22  GLUCOSE 184* 227* 178*  BUN 17 19 17   CREATININE 1.37* 1.21 1.20  CALCIUM 9.8 9.7 9.0   Liver Function Tests:  Recent Labs Lab 03/12/16 0933 03/13/16 0320 03/14/16 0325  AST 414* 311* 156*  ALT 890* 743* 557*  ALKPHOS 679* 637* 546*  BILITOT 7.3* 7.1* 2.8*  PROT 7.8 7.0 6.5  ALBUMIN 3.5 3.1* 2.8*    Recent Labs Lab 03/12/16 0933  LIPASE 180*   No results for input(s): AMMONIA in the last 168 hours. CBC:  Recent Labs Lab 03/12/16 0933 03/13/16 0320 03/14/16 0325  WBC 7.1 6.2 6.5  HGB 14.3 13.3 12.3*  HCT 43.0 41.0 38.2*  MCV 82.2 81.7 83.0  PLT 282 243 208   Cardiac  Enzymes: No results for input(s): CKTOTAL, CKMB, CKMBINDEX, TROPONINI in the last 168 hours. BNP: BNP (last 3 results) No results for input(s): BNP in the last 8760 hours.  ProBNP (last 3 results) No results for input(s): PROBNP in the last 8760 hours.  CBG:  Recent Labs Lab 03/13/16 1127 03/13/16 1628 03/13/16 1858 03/13/16 2112 03/14/16 0615  GLUCAP 143* 120* 129* 160* 188*

## 2016-03-14 NOTE — Discharge Instructions (Signed)
Liver Function Tests Liver function tests are blood tests to see how well your liver is working. The proteins and enzymes measured in the test can alert your health care provider to inflammation, damage, or disease in your liver. It is common to have liver function tests:  During annual physical exams.  When you are taking certain medicines.  If you have liver disease.  If you drink a lot of alcohol.  When you are not feeling well.  When you have other conditions that may affect the liver. Substances measured may include:   Alanine transaminase (ALT). This is an enzyme in the liver.  Aspartate transaminase (AST). This is an enzyme in the liver, heart, and muscles.  Alkaline phosphatase (ALP). This is a protein in the liver, bile ducts, and bone. It is also in other body tissues.  Total bilirubin. This is a yellow pigment in bile.  Albumin. This is a protein in the liver.  Prothrombin time and international normalized ratio (PT and INR). PT measures the time that it takes for your blood to clot. INR is a calculation of blood clotting time based upon your PT result. It is also calculated based on normal ranges defined by the laboratory that processed your lab test.  Total protein. This measures two proteins, albumin and globulin, found in the blood. PREPARATION FOR TEST How you prepare will depend on which tests are being done and the reason why these tests are being done. You may need to:  Avoid eating for 4-6 hours before the test or as directed by your health care provider.  Stop taking certain medicines prior to your blood test as directed by your health care provider. RESULTS  It is your responsibility to obtain your test results. Ask the lab or department performing the test when and how you will get your results. Contact your health care provider to discuss any questions you have about your results. RANGE OF NORMAL VALUES Ranges for normal values may vary among different  labs and hospitals. You should always check with your health care provider after having lab work or other tests done to discuss the meaning of your test results and whether your values are considered within normal limits. The following are normal ranges for substances measured in liver function tests: ALT  Infant: may be twice as high as adult values.  Child or adult: 4-36 international units/L at 37C or 4-36 units/L (SI units).  Elderly: may be slightly higher than adult values. AST  Newborn 1-64 days old: 35-140 units/L.  Child under 47 years old: 15-60 units/L.  88-35 years old: 15-50 units/L.  65-65 years old: 10-50 units/L.  68-3 years old: 10-40 units/L.  Adult: 0-35 units/L or 0-0.58 microkatal/L (SI units).  Elderly: slightly higher than adults. ALP  Child under 88 years old: 85-235 units/L.  58-41 years old: 65-210 units/L.  56-82 years old: 60-300 units/L.  76-72 years old: 30-200 units/L.  Adult: 30-120 units/L or 0.5-2.0 microkatal/L (SI units).  Elderly: slightly higher than adult. Total bilirubin  Newborn: 1.0-12.0 mg/dL or 17.1-205 micromoles/L (SI units).  Adult, elderly, or child: 0.3-1.0 mg/dL or 5.1-17 micromoles/L. Albumin  Premature infant: 3.0-4.2 g/dL.  Newborn: 3.5-5.4 g/dL.  Infant: 4.4-5.4 g/dL.  Child: 4.0-5.9 g/dL.  Adult or elderly: 3.5-5.0 g/dL or 35-50 g/L (SI units). PT  11.0-12.5 seconds; 85%-100%. INR  0.8-1.1. Total protein  Premature infant: 4.2-7.6 g/dL.  Newborn: 4.6-7.4 g/dL.  Infant: 6.0-6.7 g/dL.  Child: 6.2-8.0 g/dL.  Adult or elderly: 6.4-8.3 g/dL  or 64-83 g/L (SI units). MEANING OF RESULTS OUTSIDE NORMAL VALUE RANGES Sometimes test results can be abnormal due to other factors, such as medicines, exercise, or pregnancy. Follow up with your health care provider if you have any questions about test results outside the normal value ranges. ALT  Levels above the normal range, along with other test results, may  indicate liver disease. AST  Levels above the normal range, along with other test results, may indicate liver disease. Sometimes levels also increase after burns, surgery, heart attack, muscle damage, or seizure. ALP  Levels above the normal range, along with other test results, may indicate biliary obstruction, diseases of the liver, bone disease, thyroid disease, tumors, fractures, leukemia or lymphoma, or several other conditions. People with blood type O or B may show higher levels after a fatty meal.  Levels below the normal range, along with other test results, may indicate bone and teeth conditions, malnutrition, protein deficiency, or Wilson disease. Total bilirubin  Levels above the normal range, along with other test results, may indicate problems with the liver, gallbladder, or bile ducts. Albumin  Levels above the normal range, along with other test results, may indicate dehydration. They may also be caused by a diet that is high in protein. Sometimes, the band placed around the upper arm during the process of drawing blood can cause the level of this protein in your blood to rise and give you a result above the normal range.  Levels below the normal range, along with other tests results, may indicate kidney disease, liver disease, or malabsorption of nutrients. PT and INR  Levels above the normal range mean your blood is clotting slower than normal. This may be due to blood disorders, liver disorders, or low levels of vitamin K. Total protein  Levels above the normal range, along with other test results, may be due to infection or other diseases.  Levels below the normal range, along with other test results, may be due to an immune system disorder, bleeding, burns, kidney disorder, liver disease, trouble absorbing or getting enough nutrients, or other conditions that affect the intestines.   This information is not intended to replace advice given to you by your health care  provider. Make sure you discuss any questions you have with your health care provider.   Document Released: 08/30/2004 Document Revised: 08/18/2014 Document Reviewed: 12/01/2013 Elsevier Interactive Patient Education [employer].

## 2016-03-15 LAB — CANCER ANTIGEN 19-9: CA 19-9: 158 U/mL — ABNORMAL HIGH (ref 0–35)

## 2016-03-18 ENCOUNTER — Other Ambulatory Visit: Payer: Self-pay | Admitting: Gastroenterology

## 2016-03-19 ENCOUNTER — Encounter (HOSPITAL_COMMUNITY): Payer: Self-pay | Admitting: *Deleted

## 2016-03-20 ENCOUNTER — Ambulatory Visit (HOSPITAL_COMMUNITY): Payer: BLUE CROSS/BLUE SHIELD | Admitting: Anesthesiology

## 2016-03-20 ENCOUNTER — Ambulatory Visit (HOSPITAL_COMMUNITY)
Admission: RE | Admit: 2016-03-20 | Discharge: 2016-03-20 | Disposition: A | Discharge status: Home / Self Care | Payer: BLUE CROSS/BLUE SHIELD | Source: Ambulatory Visit | Attending: Gastroenterology | Admitting: Gastroenterology

## 2016-03-20 ENCOUNTER — Encounter (HOSPITAL_COMMUNITY)
Admission: RE | Disposition: A | Discharge status: Home / Self Care | Payer: Self-pay | Source: Ambulatory Visit | Attending: Gastroenterology

## 2016-03-20 ENCOUNTER — Encounter (HOSPITAL_COMMUNITY): Payer: Self-pay

## 2016-03-20 DIAGNOSIS — C250 Malignant neoplasm of head of pancreas: Secondary | ICD-10-CM | POA: Diagnosis not present

## 2016-03-20 DIAGNOSIS — I10 Essential (primary) hypertension: Secondary | ICD-10-CM | POA: Insufficient documentation

## 2016-03-20 DIAGNOSIS — E119 Type 2 diabetes mellitus without complications: Secondary | ICD-10-CM | POA: Insufficient documentation

## 2016-03-20 DIAGNOSIS — K869 Disease of pancreas, unspecified: Secondary | ICD-10-CM | POA: Diagnosis present

## 2016-03-20 DIAGNOSIS — Z794 Long term (current) use of insulin: Secondary | ICD-10-CM | POA: Insufficient documentation

## 2016-03-20 HISTORY — PX: EUS: SHX5427

## 2016-03-20 LAB — GLUCOSE, CAPILLARY: GLUCOSE-CAPILLARY: 297 mg/dL — AB (ref 65–99)

## 2016-03-20 SURGERY — UPPER ENDOSCOPIC ULTRASOUND (EUS) LINEAR
Anesthesia: Monitor Anesthesia Care

## 2016-03-20 MED ORDER — PROPOFOL 10 MG/ML IV BOLUS
INTRAVENOUS | Status: DC | PRN
  Administered 2016-03-20 (×2): 20 mg via INTRAVENOUS
  Administered 2016-03-20: 30 mg via INTRAVENOUS
  Administered 2016-03-20: 20 mg via INTRAVENOUS
  Administered 2016-03-20: 40 mg via INTRAVENOUS
  Administered 2016-03-20: 10 mg via INTRAVENOUS
  Administered 2016-03-20 (×5): 20 mg via INTRAVENOUS
  Administered 2016-03-20: 40 mg via INTRAVENOUS
  Administered 2016-03-20 (×10): 20 mg via INTRAVENOUS

## 2016-03-20 MED ORDER — LACTATED RINGERS IV SOLN
INTRAVENOUS | Status: DC
  Administered 2016-03-20: 1000 mL via INTRAVENOUS

## 2016-03-20 MED ORDER — PROPOFOL 10 MG/ML IV BOLUS
INTRAVENOUS | Status: AC
  Filled 2016-03-20: qty 20

## 2016-03-20 MED ORDER — PROPOFOL 10 MG/ML IV BOLUS
INTRAVENOUS | Status: AC
  Filled 2016-03-20: qty 40

## 2016-03-20 MED ORDER — SODIUM CHLORIDE 0.9 % IV SOLN: INTRAVENOUS | Status: DC

## 2016-03-20 NOTE — H&P (Signed)
  Craig [name] HPI: Pancreatic head mass.  EUS with FNA required.  Duodenal biopsies only revealed severe inflammation.  Past Medical History:  Diagnosis Date  . Diabetes mellitus without complication ([place])   . ETOH abuse   . Hypertension     Past Surgical History:  Procedure Laterality Date  . ERCP Left 03/13/2016   Procedure: ENDOSCOPIC RETROGRADE CHOLANGIOPANCREATOGRAPHY (ERCP);  Surgeon: Carol Ada, MD;  Location: [facility] ENDOSCOPY;  Service: Endoscopy;  Laterality: Left;  . HERNIA REPAIR    . KNEE ARTHROSCOPY     left and right    Family History  Problem Relation Age of Onset  . Diabetes Mother     Social History:  reports that he has never smoked. He has never used smokeless tobacco. He reports that he drinks alcohol. He reports that he does not use drugs.  Allergies: No Known Allergies  Medications:  Scheduled:  Continuous: . sodium chloride    . lactated ringers 1,000 mL (03/20/16 1226)    Results for orders placed or performed during the hospital encounter of 03/20/16 (from the past 24 hour(s))  Glucose, capillary     Status: Abnormal   Collection Time: 03/20/16 12:24 PM  Result Value Ref Range   Glucose-Capillary 297 (H) 65 - 99 mg/dL     No results found.  ROS:  As stated above in the HPI otherwise negative.  Blood pressure (!) 167/98, pulse 86, temperature 98.5 F (36.9 C), temperature source Oral, resp. rate (!) 21, height 5\' 11"  (1.803 m), weight 89.8 kg (198 lb), SpO2 100 %.    PE: Gen: NAD, Alert and Oriented HEENT:  /AT, EOMI Neck: Supple, no LAD Lungs: CTA Bilaterally CV: RRR without M/G/R ABM: Soft, NTND, +BS Ext: No C/C/E  Assessment/Plan: 1) EUS with FNA.  Aryanna Shaver D 03/20/2016, 2:10 PM

## 2016-03-20 NOTE — Op Note (Signed)
[facility] Patient Name: Craig [name] Procedure Date: 03/20/2016 MRN: [id number] Attending MD: Carol Ada , MD Date of Birth: 1956-07-21 CSN: [id number] Age: 60 Admit Type: Outpatient Procedure:                Upper EUS Indications:              Suspected solid pancreatic neoplasm Providers:                Carol Ada, MD, Zenon Mayo, RN, William Dalton, Technician Referring MD:              Medicines:                Propofol per Anesthesia Complications:            No immediate complications. Estimated Blood Loss:     Estimated blood loss was minimal. Procedure:                Pre-Anesthesia Assessment:                           - Prior to the procedure, a History and Physical                            was performed, and patient medications and                            allergies were reviewed. The patient's tolerance of                            previous anesthesia was also reviewed. The risks                            and benefits of the procedure and the sedation                            options and risks were discussed with the patient.                            All questions were answered, and informed consent                            was obtained. Prior Anticoagulants: The patient has                            taken no previous anticoagulant or antiplatelet                            agents. ASA Grade Assessment: III - A patient with                            severe systemic disease. After reviewing the risks  and benefits, the patient was deemed in                            satisfactory condition to undergo the procedure.                           - Sedation was administered by an anesthesia                            professional. Deep sedation was attained.                           After obtaining informed consent, the endoscope was                            passed under direct  vision. Throughout the                            procedure, the patient's blood pressure, pulse, and                            oxygen saturations were monitored continuously. The                            [id number]) scope was introduced through                            the mouth, and advanced to the duodenal bulb. The                            upper EUS was accomplished without difficulty. The                            patient tolerated the procedure well. Scope In: Scope Out: Findings:      Endosonographic Finding :      An irregular mass was identified in the pancreatic head. The mass was       hypoechoic. The mass measured 39 mm by 32 mm in maximal cross-sectional       diameter. The outer margins were irregular. An intact interface was seen       between the mass and the celiac trunk and portal vein suggesting a lack       of invasion. Fine needle aspiration for cytology was performed. Color       Doppler imaging was utilized prior to needle puncture to confirm a lack       of significant vascular structures within the needle path. Five passes       were made with the 25 gauge needle using a transduodenal approach. A       stylet was used. A cytotechnologist was present to evaluate the adequacy       of the specimen. The cellularity of the specimen was adequate. Final       cytology results are pending. Impression:               - A mass was identified in the pancreatic head.  Tissue was obtained from this exam, and results are                            pending. However, the endosonographic appearance is                            highly suspicious for adenocarcinoma. Fine needle                            aspiration performed. Moderate Sedation:      N/A- Per Anesthesia Care Recommendation:           - Patient has a contact number available for                            emergencies. The signs and symptoms of potential                             delayed complications were discussed with the                            patient. Return to normal activities tomorrow.                            Written discharge instructions were provided to the                            patient. Procedure Code(s):        --- Professional ---                           [phone], Esophagogastroduodenoscopy, flexible,                            transoral; with transendoscopic ultrasound-guided                            intramural or transmural fine needle                            aspiration/biopsy(s), (includes endoscopic                            ultrasound examination limited to the esophagus,                            stomach or duodenum, and adjacent structures) Diagnosis Code(s):        --- Professional ---                           K86.89, Other specified diseases of pancreas CPT copyright 2016 American Medical Association. All rights reserved. The codes documented in this report are preliminary and upon coder review may  be revised to meet current compliance requirements. Carol Ada, MD Carol Ada, MD 03/20/2016 3:57:21 PM This report has been signed electronically. Number of Addenda: 0

## 2016-03-20 NOTE — Progress Notes (Signed)
Pt states that his pain level is a 5 out of 10 at discharge, but it has gotten better throughout the recovery process. Pt states that pain is pressurein his lower/mid chest. Dr. Benson Norway aware. Pt's EKG unchanged, pt does not have difficulty breathing or pain anywhere else. Dr. Benson Norway explained to patient that pain might be caused by trapped gas and encourage pt move around and pass gas. Pt aware and verbalizes understanding. Brt, rn

## 2016-03-20 NOTE — Discharge Instructions (Signed)

## 2016-03-20 NOTE — Transfer of Care (Signed)
Immediate Anesthesia Transfer of Care Note  Patient: Craig [name]  Procedure(s) Performed: Procedure(s): UPPER ENDOSCOPIC ULTRASOUND (EUS) LINEAR (N/A)  Patient Location: PACU  Anesthesia Type:MAC  Level of Consciousness: awake, alert , oriented and patient cooperative  Airway & Oxygen Therapy: Patient Spontanous Breathing and Patient connected to face mask oxygen  Post-op Assessment: Report given to RN, Post -op Vital signs reviewed and stable and Patient moving all extremities  Post vital signs: Reviewed and stable  Last Vitals:  Vitals:   03/20/16 1156  BP: (!) 167/98  Pulse: 86  Resp: (!) 21  Temp: 36.9 C    Last Pain:  Vitals:   03/20/16 1156  TempSrc: Oral         Complications: No apparent anesthesia complications

## 2016-03-20 NOTE — Anesthesia Preprocedure Evaluation (Signed)
Anesthesia Evaluation  Patient identified by MRN, date of birth, ID band Patient awake    Reviewed: Allergy & Precautions, NPO status , Patient's Chart, lab work & pertinent test results  Airway Mallampati: I  TM Distance: >3 FB Neck ROM: Full    Dental   Pulmonary    Pulmonary exam normal        Cardiovascular hypertension, Pt. on medications Normal cardiovascular exam     Neuro/Psych    GI/Hepatic   Endo/Other  diabetes  Renal/GU      Musculoskeletal   Abdominal   Peds  Hematology   Anesthesia Other Findings   Reproductive/Obstetrics                             Anesthesia Physical Anesthesia Plan  ASA: II  Anesthesia Plan: MAC   Post-op Pain Management:    Induction: Intravenous  Airway Management Planned: Mask  Additional Equipment:   Intra-op Plan:   Post-operative Plan:   Informed Consent: I have reviewed the patients History and Physical, chart, labs and discussed the procedure including the risks, benefits and alternatives for the proposed anesthesia with the patient or authorized representative who has indicated his/her understanding and acceptance.     Plan Discussed with: CRNA and Surgeon  Anesthesia Plan Comments:         Anesthesia Quick Evaluation

## 2016-03-21 ENCOUNTER — Encounter (HOSPITAL_COMMUNITY): Payer: Self-pay | Admitting: Gastroenterology

## 2016-03-21 NOTE — Anesthesia Postprocedure Evaluation (Signed)
Anesthesia Post Note  Patient: Craig [name]  Procedure(s) Performed: Procedure(s) (LRB): UPPER ENDOSCOPIC ULTRASOUND (EUS) LINEAR (N/A)  Patient location during evaluation: PACU Anesthesia Type: MAC Level of consciousness: awake and alert Pain management: pain level controlled Vital Signs Assessment: post-procedure vital signs reviewed and stable Respiratory status: spontaneous breathing, nonlabored ventilation, respiratory function stable and patient connected to nasal cannula oxygen Cardiovascular status: stable and blood pressure returned to baseline Anesthetic complications: no    Last Vitals:  Vitals:   03/20/16 1520 03/20/16 1526  BP: (!) 173/101 (!) 173/98  Pulse: 94 94  Resp: (!) 21 (!) 23  Temp:      Last Pain:  Vitals:   03/20/16 1526  TempSrc:   PainSc: 5                  Kierstan Auer DAVID

## 2016-03-24 ENCOUNTER — Telehealth: Payer: Self-pay | Admitting: *Deleted

## 2016-03-24 NOTE — Telephone Encounter (Signed)
Oncology Nurse Navigator Documentation  Oncology Nurse Navigator Flowsheets 03/24/2016  Navigator Location [facility]  Navigator Encounter Type Introductory phone call  Abnormal Finding Date 03/12/2016  Confirmed Diagnosis Date 03/20/2016  Spoke with wife, Craig [name] (was not able to reach patient on his cell #)  and provided new patient appointment for 03/28/16 in [facility] [place] for 0930/1030 seeing Dr. Barry Dienes and Dr. Burr Medico. Informed of location of [place], valet service, and registration process. Reminded to bring photo ID, insurance cards and a current medication list, including supplements. Wife verbalizes understanding. Provided her with contact # of nurse navigator. Notified HIM to add appointment to [facility] and information faxed to [facility].

## 2016-03-28 ENCOUNTER — Ambulatory Visit (HOSPITAL_BASED_OUTPATIENT_CLINIC_OR_DEPARTMENT_OTHER): Payer: BLUE CROSS/BLUE SHIELD | Admitting: Hematology

## 2016-03-28 ENCOUNTER — Encounter: Payer: Self-pay | Admitting: *Deleted

## 2016-03-28 ENCOUNTER — Encounter: Payer: Self-pay | Admitting: Hematology

## 2016-03-28 ENCOUNTER — Ambulatory Visit: Payer: BLUE CROSS/BLUE SHIELD | Admitting: Nutrition

## 2016-03-28 ENCOUNTER — Ambulatory Visit (HOSPITAL_BASED_OUTPATIENT_CLINIC_OR_DEPARTMENT_OTHER): Payer: BLUE CROSS/BLUE SHIELD

## 2016-03-28 ENCOUNTER — Other Ambulatory Visit: Payer: Self-pay | Admitting: General Surgery

## 2016-03-28 VITALS — BP 139/90 | HR 92 | Temp 99.2°F | Resp 18 | Ht 71.0 in | Wt 197.5 lb

## 2016-03-28 DIAGNOSIS — C259 Malignant neoplasm of pancreas, unspecified: Secondary | ICD-10-CM

## 2016-03-28 DIAGNOSIS — E46 Unspecified protein-calorie malnutrition: Secondary | ICD-10-CM | POA: Diagnosis not present

## 2016-03-28 DIAGNOSIS — K8689 Other specified diseases of pancreas: Secondary | ICD-10-CM

## 2016-03-28 DIAGNOSIS — C250 Malignant neoplasm of head of pancreas: Secondary | ICD-10-CM | POA: Diagnosis not present

## 2016-03-28 HISTORY — DX: Malignant neoplasm of pancreas, unspecified: C25.9

## 2016-03-28 LAB — CBC WITH DIFFERENTIAL/PLATELET
BASO%: 0.3 % (ref 0.0–2.0)
BASOS ABS: 0 10*3/uL (ref 0.0–0.1)
EOS%: 3.2 % (ref 0.0–7.0)
Eosinophils Absolute: 0.2 10*3/uL (ref 0.0–0.5)
HCT: 37.1 % — ABNORMAL LOW (ref 38.4–49.9)
HEMOGLOBIN: 11.8 g/dL — AB (ref 13.0–17.1)
LYMPH%: 25.8 % (ref 14.0–49.0)
MCH: 25.8 pg — AB (ref 27.2–33.4)
MCHC: 31.8 g/dL — ABNORMAL LOW (ref 32.0–36.0)
MCV: 81 fL (ref 79.3–98.0)
MONO#: 0.5 10*3/uL (ref 0.1–0.9)
MONO%: 8.3 % (ref 0.0–14.0)
NEUT#: 3.6 10*3/uL (ref 1.5–6.5)
NEUT%: 62.4 % (ref 39.0–75.0)
Platelets: 313 10*3/uL (ref 140–400)
RBC: 4.58 10*6/uL (ref 4.20–5.82)
RDW: 13.1 % (ref 11.0–14.6)
WBC: 5.7 10*3/uL (ref 4.0–10.3)
lymph#: 1.5 10*3/uL (ref 0.9–3.3)

## 2016-03-28 LAB — COMPREHENSIVE METABOLIC PANEL
ALBUMIN: 2.8 g/dL — AB (ref 3.5–5.0)
ALK PHOS: 196 U/L — AB (ref 40–150)
ALT: 26 U/L (ref 0–55)
AST: 12 U/L (ref 5–34)
Anion Gap: 9 mEq/L (ref 3–11)
BUN: 10.3 mg/dL (ref 7.0–26.0)
CHLORIDE: 101 meq/L (ref 98–109)
CO2: 28 mEq/L (ref 22–29)
Calcium: 9.9 mg/dL (ref 8.4–10.4)
Creatinine: 1 mg/dL (ref 0.7–1.3)
EGFR: >90 mL/min/{1.73_m2} (ref >90)
GLUCOSE: 272 mg/dL — AB (ref 70–140)
POTASSIUM: 3.7 meq/L (ref 3.5–5.1)
SODIUM: 138 meq/L (ref 136–145)
Total Bilirubin: 0.65 mg/dL (ref 0.20–1.20)
Total Protein: 7.3 g/dL (ref 6.4–8.3)

## 2016-03-28 NOTE — Addendum Note (Signed)
Addended by: Truitt Merle on: 03/28/2016 09:28 PM   Modules accepted: Orders

## 2016-03-28 NOTE — Progress Notes (Signed)
[facility]  [phone] Fax:([phone]) [place] Note   Patient Care Team: Jilda Panda, MD as PCP - General (Internal Medicine) 03/28/2016  CHIEF COMPLAINTS/PURPOSE OF CONSULTATION:  Newly diagnosed pancreatic cancer   Oncology History   Primary pancreatic adenocarcinoma [facility])   Staging form: Pancreas, AJCC 7th Edition   - Clinical stage from 03/20/2016: Stage IB (T2, N0, M0) - Signed by Truitt Merle, MD on 03/28/2016      Primary pancreatic adenocarcinoma ([place])   03/12/2016 Imaging    CT abdomen and pelvis with contrast showed a 3.1 x 2.0 x 2.6 cm mass at the head of pancreas, preserved fat planes between the pancreatic mass, SMA, and SMV. Question loss of fat plane between the posterior aspect of mass and IVC. Mildly dilated CBD 10 mm.      03/13/2016 Procedure    ERCP and medical stem placement in CBD      03/20/2016 Initial Diagnosis    Primary pancreatic adenocarcinoma ([place])      03/20/2016 Initial Biopsy    FNA of the pancreatic head mass from EUS showed a malignant cells consistent with adenocarcinoma      03/20/2016 Procedure    EUS showed an irregular mass in the pancreatic head, measuring 3.9 x 3.2 cm, no invading of the celiac trunk or portal vein. The mass was biopsied.         HISTORY OF PRESENTING ILLNESS:  Craig [name] 60 y.o. male is here because of His newly diagnosed pancreatic cancer. He is coming up by his wife to our multidisciplinary check clinic today.  He has had epigastric pain for the last 4-6 weeks. He describes the pain is intermittent, last a few hours and a few times a day, located in the low mid chest and upper abdomen, not related to eating or position. He denies nausea, cough, or dyspnea. His appetite has decreased lately, and he complains about moderate fatigue, he was not able to tolerate his physically demanding job, and he has been out of week for 3 weeks. He lost about 30-40 lbs in the past 2  months.   He was seen by his primary care physician a few times, and he developed jaundice, and abnormal liver function. He was sent to Hospital by his primary care physician. During his hospital stay, CT scan reviewed a pancreatic head mass, and mild dilatation of bile duct. He was seen by GI Dr. Jacelyn Grip, underwent ERCP and metal stent placement in CBD. He subsequently underwent EUS which showed an irregular mass in the pancreatic head, measuring 3.9 cm, no invading of the celiac trunk or portal vein. The mass was biopsied, which showed adenocarcinoma.  He is married, lives with his wife. His abdominal pain, fatigue has not changed much since the CBD stent placement. He is able to tolerate routine activities, no other new complaints. He has been drinking beer moderately for the past 40 years, but stopped a few weeks ago.   MEDICAL HISTORY:  Past Medical History:  Diagnosis Date  . Diabetes mellitus without complication ([place])   . ETOH abuse   . Hypertension   . Primary pancreatic adenocarcinoma ([place]) 03/28/2016    SURGICAL HISTORY: Past Surgical History:  Procedure Laterality Date  . ERCP Left 03/13/2016   Procedure: ENDOSCOPIC RETROGRADE CHOLANGIOPANCREATOGRAPHY (ERCP);  Surgeon: Carol Ada, MD;  Location: [facility] ENDOSCOPY;  Service: Endoscopy;  Laterality: Left;  . EUS N/A 03/20/2016   Procedure: UPPER ENDOSCOPIC ULTRASOUND (EUS) LINEAR;  Surgeon:  Carol Ada, MD;  Location: [facility] ENDOSCOPY;  Service: Endoscopy;  Laterality: N/A;  . HERNIA REPAIR    . KNEE ARTHROSCOPY     left and right    SOCIAL HISTORY: Social History   Social History  . Marital status: Married    Spouse name: N/A  . Number of children: N/A  . Years of education: N/A   Occupational History  . Not on file.   Social History Main Topics  . Smoking status: Former Smoker    Packs/day: 1.00    Years: 15.00    Quit date: 08/12/1995  . Smokeless tobacco: Never Used  . Alcohol use Yes     Comment: he used to drink 6 pack  beer a day for 40 years, quit in 02/2016   . Drug use: No  . Sexual activity: Not on file   Other Topics Concern  . Not on file   Social History Narrative  . No narrative on file    FAMILY HISTORY: Family History  Problem Relation Age of Onset  . Heart attack Mother     ALLERGIES:  has No Known Allergies.  MEDICATIONS:  Current Outpatient Prescriptions  Medication Sig Dispense Refill  . folic acid (FOLVITE) 1 MG tablet Take 1 tablet (1 mg total) by mouth daily. 30 tablet 0  . Insulin Glargine (LANTUS SOLOSTAR) 100 UNIT/ML Solostar Pen Inject 22 Units into the skin every morning.    . thiamine 100 MG tablet Take 1 tablet (100 mg total) by mouth daily. 30 tablet 0   No current facility-administered medications for this visit.     REVIEW OF SYSTEMS:   Constitutional: Denies fevers, chills or abnormal night sweats Eyes: Denies blurriness of vision, double vision or watery eyes Ears, nose, mouth, throat, and face: Denies mucositis or sore throat Respiratory: Denies cough, dyspnea or wheezes Cardiovascular: Denies palpitation, chest discomfort or lower extremity swelling Gastrointestinal:  Denies nausea, heartburn or change in bowel habits Skin: Denies abnormal skin rashes Lymphatics: Denies new lymphadenopathy or easy bruising Neurological:Denies numbness, tingling or new weaknesses Behavioral/Psych: Mood is stable, no new changes  All other systems were reviewed with the patient and are negative.  PHYSICAL EXAMINATION: ECOG PERFORMANCE STATUS: 1 - Symptomatic but completely ambulatory  Vitals:   03/28/16 0940  BP: 139/90  Pulse: 92  Resp: 18  Temp: 99.2 F (37.3 C)   Filed Weights   03/28/16 0940  Weight: 197 lb 8 oz (89.6 kg)    GENERAL:alert, no distress and comfortable SKIN: skin color, texture, turgor are normal, no rashes or significant lesions EYES: normal, conjunctiva are pink and non-injected, sclera clear OROPHARYNX:no exudate, no erythema and lips,  buccal mucosa, and tongue normal  NECK: supple, thyroid normal size, non-tender, without nodularity LYMPH:  no palpable lymphadenopathy in the cervical, axillary or inguinal LUNGS: clear to auscultation and percussion with normal breathing effort HEART: regular rate & rhythm and no murmurs and no lower extremity edema ABDOMEN:abdomen soft, non-tender and normal bowel sounds Musculoskeletal:no cyanosis of digits and no clubbing  PSYCH: alert & oriented x 3 with fluent speech NEURO: no focal motor/sensory deficits  LABORATORY DATA:  I have reviewed the data as listed CBC Latest Ref Rng & Units 03/14/2016 03/13/2016 03/12/2016  WBC 4.0 - 10.5 K/uL 6.5 6.2 7.1  Hemoglobin 13.0 - 17.0 g/dL 12.3(L) 13.3 14.3  Hematocrit 39.0 - 52.0 % 38.2(L) 41.0 43.0  Platelets 150 - 400 K/uL 208 243 282   CMP Latest Ref Rng & Units 03/14/2016  03/13/2016 03/12/2016  Glucose 65 - 99 mg/dL 178(H) 227(H) 184(H)  BUN 6 - 20 mg/dL '17 19 17  '$ Creatinine 0.61 - 1.24 mg/dL 1.20 1.21 1.37(H)  Sodium 135 - 145 mmol/L 135 134(L) 136  Potassium 3.5 - 5.1 mmol/L 3.9 3.8 3.8  Chloride 101 - 111 mmol/L 103 100(L) 101  CO2 22 - 32 mmol/L 22 23 21(L)  Calcium 8.9 - 10.3 mg/dL 9.0 9.7 9.8  Total Protein 6.5 - 8.1 g/dL 6.5 7.0 7.8  Total Bilirubin 0.3 - 1.2 mg/dL 2.8(H) 7.1(H) 7.3(H)  Alkaline Phos 38 - 126 U/L 546(H) 637(H) 679(H)  AST 15 - 41 U/L 156(H) 311(H) 414(H)  ALT 17 - 63 U/L 557(H) 743(H) 890(H)   PATHOLOGY REPORT  Diagnosis 03/20/2016 FINE NEEDLE ASPIRATION, ENDOSCOPIC, PANCREAS HEAD (SPECIMEN 1 OF 1 COLLECTED 03/20/16): MALIGNANT CELLS CONSISTENT WITH ADENOCARCINOMA. Preliminary Diagnosis Intraoperative Diagnosis: Adequate. ([facility])  Diagnosis 03/13/2016 Duodenum, Biopsy, Duodenal mass - BENIGN ULCERATED AND INFLAMED SMALL BOWEL-TYPE MUCOSA. - THERE IS NO EVIDENCE OF MALIGNANCY. - SEE COMMENT. Microscopic Comment Multiple tissue levels have been examined. Dr. Claudette Laws has reviewed the case and concurs with  this interpretation. Dr. Benson Norway was paged on 03/14/2016. (JBK:kh 03/14/16)  RADIOGRAPHIC STUDIES: I have personally reviewed the radiological images as listed and agreed with the findings in the report. Dg Chest 2 View  Result Date: 03/12/2016 CLINICAL DATA:  Chest and epigastric region pain EXAM: CHEST  2 VIEW COMPARISON:  None. FINDINGS: Lungs are clear. Heart size and pulmonary vascularity are normal. No adenopathy. There is degenerative change in the thoracic spine. IMPRESSION: No edema or consolidation. Electronically Signed   By: Lowella Grip III M.D.   On: 03/12/2016 10:10   US Abdomen Complete  Result Date: 03/12/2016 CLINICAL DATA:  Chest pain.  Abnormal LFTs. EXAM: ABDOMEN ULTRASOUND COMPLETE COMPARISON:  None. FINDINGS: Gallbladder: No gallstones or wall thickening visualized. No sonographic Murphy sign noted by sonographer. Moderate distention, normal wall thickness. Common bile duct: Diameter: Increased, between 7.3 and 7.9 mm. Liver: No focal lesion identified. Within normal limits in parenchymal echogenicity. Intrahepatic biliary ductal dilatation is present. IVC: No abnormality visualized. Pancreas: Increased size of the pancreatic duct, up to 4 mm. No mass is evident. Spleen: Size and appearance within normal limits. Right Kidney: Length: 9.9 cm. Echogenicity within normal limits. No mass or hydronephrosis visualized. Left Kidney: Length: 11.8 cm. Echogenicity within normal limits. No mass or hydronephrosis visualized. Two 1 cm simple cysts are noted in the kidney, non worrisome Abdominal aorta: No aneurysm visualized. Other findings: None. IMPRESSION: Intra and extrahepatic biliary ductal dilatation is present, along with abnormal dilatation of the pancreatic duct. CBD up to 8 mm and PD approximately 4 mm. Concern for ampullary mass or stone. CT abdomen and pelvis with contrast recommended for further evaluation. Electronically Signed   By: Staci Righter M.D.   On: 03/12/2016 12:22   Ct  Abdomen Pelvis W Contrast  Result Date: 03/12/2016 CLINICAL DATA:  Epigastric pain off and on for 1 month, abnormal bile duct by ultrasound EXAM: CT ABDOMEN AND PELVIS WITH CONTRAST TECHNIQUE: Multidetector CT imaging of the abdomen and pelvis was performed using the standard protocol following bolus administration of intravenous contrast. Sagittal and coronal MPR images reconstructed from axial data set. CONTRAST:  144m ISOVUE-300 IOPAMIDOL (ISOVUE-300) INJECTION 61% IV. No oral contrast administered. COMPARISON:  None; correlation ultrasound abdomen 03/12/2016 FINDINGS: Lower chest:  Lung bases clear Hepatobiliary: Liver normal appearance. Small fold at gallbladder fundus, gallbladder otherwise unremarkable. Prominent CBD  10 mm diameter on coronal images. No significant intrahepatic biliary dilatation. Pancreas: Somewhat ill defined low-attenuation mass at head/uncinate of pancreas, 3.1 x 2.0 x 2.6 cm in size highly suspicious for a pancreatic neoplasm. Preserved fat planes between the pancreatic mass, SMA, and SMV. Mass abuts the second and third portions of duodenum. Question loss of fat plane between the posterior aspect of mass and IVC. No pancreatic ductal dilatation or atrophy. No pancreatic calcifications or additional fluid collections. Spleen: Normal appearance Adrenals/Urinary Tract: Adrenal glands normal appearance. Tiny RIGHT renal cyst. Kidneys, ureters, and bladder otherwise normal appearance. Stomach/Bowel: Normal appendix. Stomach and bowel loops normal appearance. Vascular/Lymphatic: Atherosclerotic calcifications aorta and iliac arteries. Coronary arterial calcifications. No definite adenopathy. Reproductive: N/A Other: Nodular irregularity of the anterior abdominal wall at the umbilicus question prior umblical hernia repair. Tiny LEFT parasagittal periumbilical hernia. No free air or free fluid. Musculoskeletal: Osseous structures unremarkable. IMPRESSION: Mildly dilated CBD 10 mm diameter  without intrahepatic biliary dilatation. 3.1 x 2.0 x 2.6 cm diameter mass at head/uncinate process of pancreas highly suspicious for pancreatic neoplasm. Findings called to Dr. Tomi Bamberger on 03/12/2016 at 1353 hours. Electronically Signed   By: Lavonia Dana M.D.   On: 03/12/2016 13:54   Dg Ercp Biliary & Pancreatic Ducts  Result Date: 03/14/2016 CLINICAL DATA:  Biliary dilatation and pancreatic head mass. EXAM: ERCP TECHNIQUE: Multiple spot images obtained with the fluoroscopic device and submitted for interpretation post-procedure. FLUOROSCOPY TIME:  If the device does not provide the exposure index: Fluoroscopy Time:  2 minutes and 11 seconds Number of Acquired Images:  4 COMPARISON:  CT 03/12/2016 FINDINGS: Wire was advanced into the main pancreatic duct and common bile duct. Opacification of the biliary system demonstrates narrowing or obstruction in the distal common bile duct. Mild dilatation of the extrahepatic biliary system. A metallic stent was placed in the distal common bile duct. IMPRESSION: Placement of metallic biliary stent for treatment of the distal common bile duct obstruction. These images were submitted for radiologic interpretation only. Please see the procedural report for the amount of contrast and the fluoroscopy time utilized. Electronically Signed   By: Markus Daft M.D.   On: 03/14/2016 17:41   EUS Dr. Benson Norway  Endosonographic Finding 03/20/2016 Findings: An irregular mass was identified in the pancreatic head. The mass was hypoechoic. The mass measured 39 mm by 32 mm in maximal cross-sectional diameter. The outer margins were irregular. An intact interface was seen between the mass and the celiac trunk and portal vein suggesting a lack of invasion. Fine needle aspiration for cytology was performed. Color Doppler imaging was utilized prior to needle puncture to confirm a lack of significant vascular structures within the needle path. Five passes were made with the 25 gauge needle using a  transduodenal approach. A stylet was used. A cytotechnologist was present to evaluate the adequacy of the specimen. The cellularity of the specimen was adequate. Final cytology results are pending. - A mass was identified in the pancreatic head. Tissue was obtained from this exam, and results are pending. However, the endosonographic appearance is highly suspicious for adenocarcinoma. Fine needle aspiration performed.  ERCP 03/13/2016 Impression: - One covered metal stent was placed into the common bile duct.   ASSESSMENT & PLAN: 60 year old African-American male, with past medical history of diabetes and hypertension, presented with epigastric pain, weight loss, and jaundice.  1. Primary pancreatic adenocarcinoma, in pancreas head, cT2N0M0, stage IB, resectable  -I have reviewed his CT abdomen and pelvis with contrast, EUS and  biopsy findings with patient and his wife in great details. -His case was reviewed in our GI tumor board a few days ago. Post CT scan and US showed no evidence of vascular invasion from the pancreatic tumor, this is resectable disease. -He would need a chest CT to ruled out thoracic metastasis. I would prefer a CT abdomen and pelvis with pancreas protocol to further evaluate the tumor resectability. -We reviewed the nature history of pancreatic cancer, and the overall survival rate with chemotherapy, surgery, and radiation. Patient and his wife was discouraged by the overall dismal long term survival rate (~20%)  -He was seen by surgeon Dr. Barry Dienes today, Whipple surgery was discussed and offered to patient. Patient's is little reluctant to have the high risk Whipple surgery, will think about it.  -I reviewed the high risk of cancer recurrence after surgery, and the role of neoadjuvant and adjuvant chemotherapy to reduce the risk of recurrence, and shrink the tumor before surgery. -I discussed with Dr. Barry Dienes, we recommend patient to have new adjuvant chemotherapy first. -I  encourage pt to consider the clinical trial SWOG S1505, which is a phase II, randomized, resectable, mFOLFIRINOX vs gemcitabine and Abraxane 3 cycles preop and 3 cycles postoperative. He met our [facility] today, a copy of the trial consent was given to patient for reviewing and consideration. -Off the trial, I would offer FOLFIRINOX as his new adjuvant chemotherapy for 4 cycles, and repeat a CT scan   2. HTN and DM - he will continue medication and follow up with his primary care physician  -We reviewed her that his blood pressure and blood glucose will need to be monitored closely during the chemotherapy, and his medication may need to be adjusted   3. Malnutrition -He has lost significant amount weight, albumin is low, he was seen by our dietitian Pamala Hurry today -We'll watch his weight and nutrition status closely when he is on chemotherapy  Plan - repeat lab today -Chemotherapy class next week -CT chest, abdomen and pelvis with contrast (pancreatic protocol) next week -Port placement by Dr. Barry Dienes -I'll see him back in 1 week, and tentatively start his chemotherapy on the week of August 28  Orders Placed This Encounter  Procedures  . CT Abdomen W Contrast    Standing Status:   Future    Standing Expiration Date:   03/28/2017    Order Specific Question:   If indicated for the ordered procedure, I authorize the administration of contrast media per Radiology protocol    Answer:   Yes    Order Specific Question:   Reason for Exam (SYMPTOM  OR DIAGNOSIS REQUIRED)    Answer:   pancreatic protocol for resectability    Order Specific Question:   Preferred imaging location?    Answer:   [facility]  . CT Chest W Contrast    Standing Status:   Future    Standing Expiration Date:   03/28/2017    Order Specific Question:   If indicated for the ordered procedure, I authorize the administration of contrast media per Radiology protocol    Answer:   Yes    Order Specific  Question:   Reason for Exam (SYMPTOM  OR DIAGNOSIS REQUIRED)    Answer:   rule out metastasis    Order Specific Question:   Preferred imaging location?    Answer:   [facility]  . CBC with Differential    Standing Status:   Standing    Number  of Occurrences:   40    Standing Expiration Date:   03/28/2021  . Comprehensive metabolic panel    Standing Status:   Standing    Number of Occurrences:   40    Standing Expiration Date:   03/28/2021  . CA 19.9    Standing Status:   Standing    Number of Occurrences:   40    Standing Expiration Date:   04/28/2016    All questions were answered. The patient knows to call the clinic with any problems, questions or concerns. I spent 55 minutes counseling the patient face to face. The total time spent in the appointment was 60 minutes and more than 50% was on counseling.     Truitt Merle, MD 03/28/2016 11:08 AM

## 2016-03-28 NOTE — Progress Notes (Signed)
Oncology Nurse Navigator Documentation  Oncology Nurse Navigator Flowsheets 03/28/2016  Navigator Location [facility]  Navigator Encounter Type Clinic/MDC  Abnormal Finding Date -  Confirmed Diagnosis Date -  Patient Visit Type RadOnc;Surgery  Treatment Phase Pre-Tx/Tx Discussion  Barriers/Navigation Needs Coordination of Care;Education  Education Understanding Cancer/ Treatment Options;Newly Diagnosed Cancer Education;Preparing for Upcoming Surgery/ Treatment  Interventions Coordination of Care;Education Method  Coordination of Care Radiology--message to managed care to expedite authorization for CT scan  Education Method Verbal;Written;Teach-back  Support Groups/Services [employer]--Personal Health Manager  Acuity Level 2  Time Spent with Patient 48  Met with patient and wife, Cleta Alberts during new patient visit. Explained the role of the GI Nurse Navigator and provided New Patient Packet with information on: 1. Pancreas cancer--information on CA 19.9 testing  2. Support groups 3. Advanced Directives 4. Fall Safety Plan Answered questions, reviewed current treatment plan using TEACH back and provided emotional support. Provided copy of current treatment plan. Called research nurse to see patient today to review potential SWOG [phone] trial. Inquired if he would like to see radiation oncology also today, but they requested this to occur at a later date.  Merceda Elks, RN, BSN GI Oncology [place]

## 2016-03-28 NOTE — Progress Notes (Signed)
Patient was seen in [facility] with wife.  60 year old male diagnosed with Pancreas Cancer.  PMH includes DM diagnosed in 2003, ETOH, and HTN.  Medications include Folvite, Lantus, and Thiamine.  Labs include Glucose 178 and Albumin 2.8.  Height: 5\' 11" . Weight:197 pounds. UBW: 240 pounds. BMI: 27.62.  Patient has lost at least 40 pounds from his usual body weight. Patient denies current alcohol intake. He has a decreased appetite. Reports occasional loose stools. Denies N/V.  Nutrition Diagnosis:  Unintended weight loss related to new diagnosis of pancreas cancer as evidenced by 18% weight loss from usual body weight.  Intervention: Educated patient to consume small frequent meals and snacks consisting of high-calorie, high-protein foods. Encouraged patient to try oral nutrition supplements and provided samples of Glucerna and boost glucose control. Also provided patient with coupons. Questions were answered.  Teach back method was used. I stressed importance of minimizing further weight loss.  Monitoring, evaluation, goals:  Patient will tolerate increased calories and protein to promote weight maintenance.  Next visit: To be scheduled as needed.  **Disclaimer: This note was dictated with voice recognition software. Similar sounding words can inadvertently be transcribed and this note may contain transcription errors which may not have been corrected upon publication of note.**

## 2016-03-28 NOTE — Patient Instructions (Signed)
Care Plan Summary- 03/28/2016 Name:  Craig [name]       DOB:  Oct 06, 1955 Your Medical Team: Medical Oncologist:  Dr. Truitt Merle Radiation Oncologist:  Surgeon:   Dr. Stark Klein Type of Cancer: Adenocarcinoma of Pancreas  Stage/Grade: Stage IB *Exact staging of your cancer is based on size of the tumor, depth of invasion, involvement of lymph nodes or not, and whether or not the cancer has spread beyond the primary site   Recommendations: Based on information available as of today's consult. Recommendations may change depending on the results of further tests or exams. 1) Chemotherapy with 5FU/Leucovorin/Irinotecan/Oxaliplatin-plan to start 04/07/16 2) Complete staging with CT chest/abdomen/pelvis (pancreas protocol) 3) Consider clinical trial (take home trial information and read) Next Steps:       1)  Labs today 2) Chemo education class and see Dr. Burr Medico next week 3) Dr. Marlowe Aschoff office will call with port placement (plan for 8/28 early am)   Questions? Merceda Elks, RN, BSN at [phone]. Manuela Schwartz is your Oncology Nurse Navigator and is available to assist you while you're receiving your medical care at [facility].

## 2016-03-28 NOTE — Progress Notes (Signed)
Checked in pt for registration & introduced myself as his FA.  Informed him once I receive his treatment plan I will reach out if copay assistance is needed and discussed the [place] gave him an expense sheet.  He has my card for any questions or concerns he may.

## 2016-03-28 NOTE — Progress Notes (Signed)
[facility] Psychosocial Distress Screening Clinical Social Work  Clinical Social Work met with pt, Craig [name] and his wife, Craig [name] at [facility] to introduce self, explain role of CSW/Pt and Family Support Team and to review the distress screening protocol. The patient scored a 9 on the Psychosocial Distress Thermometer which indicates severe distress. Clinical Social Worker met with pt and his wife at length to assess for distress and other psychosocial needs. Pt reports his anxiety is new and directly related to his new cancer diagnosis. Both wife and pt report anxiety is a concern. Pt feels his anxiety is manageable currently. CSW suggested pt consider behavioral techniques that may assist his anxiety at stressful points in his cancer journey. He will think of some coping techniques, as he could not currently identify any.   Pt reports he used to cope by drinking, but has reported he stopped 2 weeks ago. CSW inquired if he needed additional assistance with his drinking and he declines. He reports he was able to stop smoking cold [place] and has been able to stop drinking as well. CSW strongly encouraged pt to consider additional supports through the [facility] and reviewed these options with both and wife. CSW reviewed Support Programs, calendar, etc. Pt and wife agree to reach out as other needs arise. Pt had his physical concerns addressed by medical team at today's visit.     ONCBCN DISTRESS SCREENING 03/28/2016  Screening Type Initial Screening  Distress experienced in past week (1-10) 9  Emotional problem type Nervousness/Anxiety;Adjusting to illness  Spiritual/Religous concerns type Relating to God  Physical Problem type Pain;Sleep/insomnia;Getting around;Breathing;Loss of appetitie;Constipation/diarrhea;Sexual problems  Physician notified of physical symptoms Yes  Referral to clinical social work Yes  Referral to dietition Yes  Referral to support programs Yes    Clinical Social Worker follow up  needed: No.  If yes, follow up plan:  Loren Racer, [place]  [facility] Phone: [phone] Fax: [phone]

## 2016-03-29 LAB — CANCER ANTIGEN 19-9: CA 19-9: 189 U/mL — ABNORMAL HIGH (ref 0–35)

## 2016-03-31 ENCOUNTER — Telehealth: Payer: Self-pay | Admitting: *Deleted

## 2016-03-31 NOTE — Telephone Encounter (Signed)
Oncology Nurse Navigator Documentation  Oncology Nurse Navigator Flowsheets 03/31/2016  Navigator Location [facility]  Navigator Encounter Type Telephone  Telephone Outgoing Call;Appt Confirmation/Clarification  Abnormal Finding Date -  Confirmed Diagnosis Date -  Patient Visit Type -  Treatment Phase -  Barriers/Navigation Needs Coordination of Care--CT scan prior to 8/25  Education -  Interventions Coordination of Care--scheduled CT scan and patient notified  Coordination of Care Radiology  Education Method Verbal;Teach-back  Support Groups/Services -  Acuity -  Time Spent with Patient 15  Craig [name] notified of CT on 04/03/16 at 0815/0830. NPO for 4 hours prior. Does not need to drink oral contrast since his scan is pancreas protocol. He will be given water contrast in CT department. Confirmed that scan was authorized with [employer] Bidding in managed care.

## 2016-04-01 ENCOUNTER — Other Ambulatory Visit: Payer: Self-pay | Admitting: *Deleted

## 2016-04-03 ENCOUNTER — Encounter (HOSPITAL_COMMUNITY): Payer: Self-pay

## 2016-04-03 ENCOUNTER — Encounter (HOSPITAL_COMMUNITY)
Admission: RE | Admit: 2016-04-03 | Discharge: 2016-04-03 | Disposition: A | Discharge status: Home / Self Care | Payer: BLUE CROSS/BLUE SHIELD | Source: Ambulatory Visit | Attending: General Surgery | Admitting: General Surgery

## 2016-04-03 ENCOUNTER — Ambulatory Visit (HOSPITAL_COMMUNITY)
Admission: RE | Admit: 2016-04-03 | Discharge: 2016-04-03 | Disposition: A | Discharge status: Home / Self Care | Payer: BLUE CROSS/BLUE SHIELD | Source: Ambulatory Visit | Attending: Hematology | Admitting: Hematology

## 2016-04-03 ENCOUNTER — Encounter: Payer: Self-pay | Admitting: Hematology

## 2016-04-03 DIAGNOSIS — N189 Chronic kidney disease, unspecified: Secondary | ICD-10-CM | POA: Insufficient documentation

## 2016-04-03 DIAGNOSIS — M899 Disorder of bone, unspecified: Secondary | ICD-10-CM | POA: Insufficient documentation

## 2016-04-03 DIAGNOSIS — C259 Malignant neoplasm of pancreas, unspecified: Secondary | ICD-10-CM | POA: Insufficient documentation

## 2016-04-03 DIAGNOSIS — I129 Hypertensive chronic kidney disease with stage 1 through stage 4 chronic kidney disease, or unspecified chronic kidney disease: Secondary | ICD-10-CM | POA: Diagnosis not present

## 2016-04-03 DIAGNOSIS — F1010 Alcohol abuse, uncomplicated: Secondary | ICD-10-CM | POA: Diagnosis not present

## 2016-04-03 DIAGNOSIS — E1122 Type 2 diabetes mellitus with diabetic chronic kidney disease: Secondary | ICD-10-CM | POA: Insufficient documentation

## 2016-04-03 DIAGNOSIS — Z87891 Personal history of nicotine dependence: Secondary | ICD-10-CM | POA: Diagnosis not present

## 2016-04-03 LAB — BASIC METABOLIC PANEL
ANION GAP: 8 (ref 5–15)
BUN: 7 mg/dL (ref 6–20)
CALCIUM: 8.8 mg/dL — AB (ref 8.9–10.3)
CHLORIDE: 100 mmol/L — AB (ref 101–111)
CO2: 23 mmol/L (ref 22–32)
Creatinine, Ser: 0.96 mg/dL (ref 0.61–1.24)
GFR calc non Af Amer: >60 mL/min (ref >60)
Glucose, Bld: 480 mg/dL — ABNORMAL HIGH (ref 65–99)
Potassium: 3.8 mmol/L (ref 3.5–5.1)
Sodium: 131 mmol/L — ABNORMAL LOW (ref 135–145)

## 2016-04-03 LAB — CBC
HEMATOCRIT: 35.6 % — AB (ref 39.0–52.0)
HEMOGLOBIN: 11.6 g/dL — AB (ref 13.0–17.0)
MCH: 26.2 pg (ref 26.0–34.0)
MCHC: 32.6 g/dL (ref 30.0–36.0)
MCV: 80.5 fL (ref 78.0–100.0)
Platelets: 299 10*3/uL (ref 150–400)
RBC: 4.42 MIL/uL (ref 4.22–5.81)
RDW: 12.7 % (ref 11.5–15.5)
WBC: 5.6 10*3/uL (ref 4.0–10.5)

## 2016-04-03 LAB — GLUCOSE, CAPILLARY: Glucose-Capillary: 407 mg/dL — ABNORMAL HIGH (ref 65–99)

## 2016-04-03 MED ORDER — CHLORHEXIDINE GLUCONATE CLOTH 2 % EX PADS: 6.0000 | MEDICATED_PAD | Freq: Once | CUTANEOUS | Status: DC

## 2016-04-03 MED ORDER — IOPAMIDOL (ISOVUE-300) INJECTION 61%
75.0000 mL | Freq: Once | INTRAVENOUS | Status: AC | PRN
  Administered 2016-04-03: 75 mL via INTRAVENOUS

## 2016-04-03 NOTE — Progress Notes (Signed)
Talked w/ pt regarding copay assistance w/ the [place].  He would like to apply so I completed the application and will submit on 04/04/16 when pt comes for his next visit and signs the app.

## 2016-04-03 NOTE — Pre-Procedure Instructions (Signed)
Craig [name]  04/03/2016     No Pharmacies Listed   Your procedure is scheduled on 04/07/16.  Report to [facility] Admitting at 530 A.M.  Call this number if you have problems the morning of surgery:  [phone]   Remember:  Do not eat food or drink liquids after midnight.  Take these medicines the morning of surgery with A SIP OF WATER --none   Do not wear jewelry, make-up or nail polish.  Do not wear lotions, powders, or perfumes, or deoderant.  Do not shave 48 hours prior to surgery.  Men may shave face and neck.  Do not bring valuables to the hospital.  [facility] is not responsible for any belongings or valuables.  Contacts, dentures or bridgework may not be worn into surgery.  Leave your suitcase in the car.  After surgery it may be brought to your room.  For patients admitted to the hospital, discharge time will be determined by your treatment team.  Patients discharged the day of surgery will not be allowed to drive home.   Name and phone number of your driver:   Special instructions: Do not take any aspirin,anti-inflammatories,vitamins,or herbal supplements 5-7 days prior to surgery.  Please read over the following fact sheets that you were given.    How to Manage Your Diabetes Before and After Surgery  Why is it important to control my blood sugar before and after surgery? . Improving blood sugar levels before and after surgery helps healing and can limit problems. . A way of improving blood sugar control is eating a healthy diet by: o  Eating less sugar and carbohydrates o  Increasing activity/exercise o  Talking with your doctor about reaching your blood sugar goals . High blood sugars (greater than 180 mg/dL) can raise your risk of infections and slow your recovery, so you will need to focus on controlling your diabetes during the weeks before surgery. . Make sure that the doctor who takes care of your diabetes knows about your planned  surgery including the date and location.  How do I manage my blood sugar before surgery? . Check your blood sugar at least 4 times a day, starting 2 days before surgery, to make sure that the level is not too high or low. o Check your blood sugar the morning of your surgery when you wake up and every 2 hours until you get to the Short Stay unit. . If your blood sugar is less than 70 mg/dL, you will need to treat for low blood sugar: o Do not take insulin. o Treat a low blood sugar (less than 70 mg/dL) with  cup of clear juice (cranberry or apple), 4 glucose tablets, OR glucose gel. o Recheck blood sugar in 15 minutes after treatment (to make sure it is greater than 70 mg/dL). If your blood sugar is not greater than 70 mg/dL on recheck, call [phone] for further instructions. . Report your blood sugar to the short stay nurse when you get to Short Stay.  . If you are admitted to the hospital after surgery: o Your blood sugar will be checked by the staff and you will probably be given insulin after surgery (instead of oral diabetes medicines) to make sure you have good blood sugar levels. o The goal for blood sugar control after surgery is 80-180 mg/dL.              WHAT DO I DO ABOUT MY DIABETES MEDICATION?   Marland Kitchen  Do not take oral diabetes medicines (pills) the morning of surgery.  . THE NIGHT BEFORE SURGERY, take ____11_______ units of ________lantus___insulin.       Marland Kitchen HE MORNING OF SURGERY, take _____11________ units of ______lantus____insulin.  . The day of surgery, do not take other diabetes injectables, including Byetta (exenatide), Bydureon (exenatide ER), Victoza (liraglutide), or Trulicity (dulaglutide).  . If your CBG is greater than 220 mg/dL, you may take  of your sliding scale (correction) dose of insulin.  Other Instructions:          Patient Signature:  Date:   Nurse Signature:  Date:   Reviewed and Endorsed by [facility] Patient Education Committee,  August 2015

## 2016-04-03 NOTE — Progress Notes (Signed)
Pt instructed to check glucose 2x daily, and write them down. Needs to F/U with PCP.Marland Kitchen Reviewed diet as well.  Reported to [employer].

## 2016-04-04 ENCOUNTER — Other Ambulatory Visit: Payer: BLUE CROSS/BLUE SHIELD

## 2016-04-04 ENCOUNTER — Encounter: Payer: Self-pay | Admitting: *Deleted

## 2016-04-04 ENCOUNTER — Ambulatory Visit (HOSPITAL_BASED_OUTPATIENT_CLINIC_OR_DEPARTMENT_OTHER): Payer: BLUE CROSS/BLUE SHIELD | Admitting: Hematology

## 2016-04-04 VITALS — BP 126/86 | HR 88 | Temp 98.4°F | Resp 17 | Ht 71.0 in | Wt 194.1 lb

## 2016-04-04 DIAGNOSIS — E46 Unspecified protein-calorie malnutrition: Secondary | ICD-10-CM

## 2016-04-04 DIAGNOSIS — I10 Essential (primary) hypertension: Secondary | ICD-10-CM | POA: Diagnosis not present

## 2016-04-04 DIAGNOSIS — E119 Type 2 diabetes mellitus without complications: Secondary | ICD-10-CM | POA: Diagnosis not present

## 2016-04-04 DIAGNOSIS — C250 Malignant neoplasm of head of pancreas: Secondary | ICD-10-CM | POA: Diagnosis not present

## 2016-04-04 DIAGNOSIS — C259 Malignant neoplasm of pancreas, unspecified: Secondary | ICD-10-CM

## 2016-04-04 MED ORDER — LIDOCAINE-PRILOCAINE 2.5-2.5 % EX CREA: TOPICAL_CREAM | CUTANEOUS | 3 refills | Status: DC

## 2016-04-04 MED ORDER — LIDOCAINE-PRILOCAINE 2.5-2.5 % EX CREA: TOPICAL_CREAM | CUTANEOUS | 3 refills | Status: AC

## 2016-04-04 MED ORDER — PROCHLORPERAZINE MALEATE 10 MG PO TABS: 10.0000 mg | ORAL_TABLET | Freq: Three times a day (TID) | ORAL | 1 refills | Status: AC | PRN

## 2016-04-04 MED ORDER — CEFAZOLIN SODIUM-DEXTROSE 2-4 GM/100ML-% IV SOLN
2.0000 g | INTRAVENOUS | Status: AC
  Administered 2016-04-07: 2 g via INTRAVENOUS
  Filled 2016-04-04: qty 100

## 2016-04-04 MED ORDER — PROCHLORPERAZINE MALEATE 10 MG PO TABS: 10.0000 mg | ORAL_TABLET | Freq: Four times a day (QID) | ORAL | 1 refills | Status: DC | PRN

## 2016-04-04 MED ORDER — ONDANSETRON HCL 8 MG PO TABS: 8.0000 mg | ORAL_TABLET | Freq: Two times a day (BID) | ORAL | 1 refills | Status: DC | PRN

## 2016-04-04 MED ORDER — ONDANSETRON HCL 8 MG PO TABS: 8.0000 mg | ORAL_TABLET | Freq: Three times a day (TID) | ORAL | 1 refills | Status: DC | PRN

## 2016-04-04 NOTE — Progress Notes (Signed)
Anesthesia Chart Review: Patient is a 60 year old male scheduled for insertion of a Port-a-cath on 04/07/16 by Dr. Barry Dienes. DX: Pancreatic cancer. Chemotherapy is planned (tentative plan to start next week) with consideration of Whipple procedure in the future.   History includes former smoker, pancreatic adenocarcinoma, DM2, ETOH abuse (recently sober, 02/2016), HTN, CKD.   PCP is Dr. Jilda Panda. HEM-ONC is Dr. Burr Medico, last visit 04/04/16. According to notes, he saw Dr. Barry Dienes today as well.   BP (!) 150/85   Pulse 93   Temp 37 C   Resp 18   Ht 5\' 11"  (1.803 m)   Wt 197 lb 8 oz (89.6 kg)   SpO2 100%   BMI 27.55 kg/m   Meds include folic acid, Lantus 22 Units Q AM, thiamine, Zenpep.   03/12/16 EKG: ST at 105, non-specific T wave abnormality.  04/03/16 CT Chest: IMPRESSION: - No discrete pulmonary nodules or areas of pulmonary consolidation involving the lungs. - Nonspecific sclerotic lesions within the posterior left second and third ribs may represent bone islands. Metastasis are not entirely excluded. Consider correlation with bone scan as clinically indicated. - Interval insertion of biliary stent. The known pancreatic head mass is not completely included on current evaluation.  Preoperative labs noted. Na 131. Cr 0.96, Glucose 480 (CBG 407 on arrival; patient reported drinking a [employer] soda just prior to arriving at PAT). H/H 11.6/35.6. A1c was 10.3 on 03/12/16, consistent with a average glucose of 249. Unfortunately, he does not check his home CBG, but does have a home meter. His PAT RN reviewed general DM diet recommendations, recommendations to check CBG BID, and follow-up with PCP. I also asked her to let him know that a glucose near or > 250 range could delay or cancel procedure if not felt urgent/emergent by Dr. Barry Dienes.  She has marked labs are reviewed. Dr. Ernestina Penna note mentioned repeating labs today and orders were in Epic this morning, but as of 4:20PM, I don't see those orders any  longer. He will get a fasting CBG on arrival. Proceeding as planned may depend on this CBG result and urgency of procedure. He will be taking 11 Units of Lantus on the morning of surgery.   [facility] Short Stay Center/Anesthesiology Phone [phone] 04/04/2016 4:24 PM

## 2016-04-04 NOTE — Progress Notes (Signed)
[place]  [phone] Fax:([phone]) [phone]  Clinic Follow Up Note   Patient Care Team: Jilda Panda, MD as PCP - General (Internal Medicine) 04/04/2016  CHIEF COMPLAINTS:  Follow up pancreatic cancer   Oncology History   Primary pancreatic adenocarcinoma [facility])   Staging form: Pancreas, AJCC 7th Edition   - Clinical stage from 03/20/2016: Stage IB (T2, N0, M0) - Signed by Truitt Merle, MD on 03/28/2016      Primary pancreatic adenocarcinoma ([place])   03/12/2016 Imaging    CT abdomen and pelvis with contrast showed a 3.1 x 2.0 x 2.6 cm mass at the head of pancreas, preserved fat planes between the pancreatic mass, SMA, and SMV. Question loss of fat plane between the posterior aspect of mass and IVC. Mildly dilated CBD 10 mm.      03/13/2016 Procedure    ERCP and medical stem placement in CBD      03/20/2016 Initial Diagnosis    Primary pancreatic adenocarcinoma ([place])      03/20/2016 Initial Biopsy    FNA of the pancreatic head mass from EUS showed a malignant cells consistent with adenocarcinoma      03/20/2016 Procedure    EUS showed an irregular mass in the pancreatic head, measuring 3.9 x 3.2 cm, no invading of the celiac trunk or portal vein. The mass was biopsied.         HISTORY OF PRESENTING ILLNESS:  Craig [name] 60 y.o. male is here because of His newly diagnosed pancreatic cancer. He is coming up by his wife to our multidisciplinary check clinic today.  He has had epigastric pain for the last 4-6 weeks. He describes the pain is intermittent, last a few hours and a few times a day, located in the low mid chest and upper abdomen, not related to eating or position. He denies nausea, cough, or dyspnea. His appetite has decreased lately, and he complains about moderate fatigue, he was not able to tolerate his physically demanding job, and he has been out of week for 3 weeks. He lost about 30-40 lbs in the past 2 months.   He was seen by his  primary care physician a few times, and he developed jaundice, and abnormal liver function. He was sent to Hospital by his primary care physician. During his hospital stay, CT scan reviewed a pancreatic head mass, and mild dilatation of bile duct. He was seen by GI Dr. Jacelyn Grip, underwent ERCP and metal stent placement in CBD. He subsequently underwent EUS which showed an irregular mass in the pancreatic head, measuring 3.9 cm, no invading of the celiac trunk or portal vein. The mass was biopsied, which showed adenocarcinoma.  He is married, lives with his wife. His abdominal pain, fatigue has not changed much since the CBD stent placement. He is able to tolerate routine activities, no other new complaints. He has been drinking beer moderately for the past 40 years, but stopped a few weeks ago.   CURRENT THERAPY: pending neoadjuvant chemotherapy gemcitabine and Abraxane  INTERIM HISTORY: Mr. Mirante returns for follow-up. He has participated the chemotherapy class, and decided to take the gemcitabine and Abraxane regiment. He is not comfortable with 5-FU continuous infusion with a pump. He feels well overall, no other new complaints.   MEDICAL HISTORY:  Past Medical History:  Diagnosis Date  . Chronic kidney disease   . Diabetes mellitus without complication ([place])   . ETOH abuse   . Hypertension   . Primary pancreatic adenocarcinoma (  [place]) 03/28/2016    SURGICAL HISTORY: Past Surgical History:  Procedure Laterality Date  . ERCP Left 03/13/2016   Procedure: ENDOSCOPIC RETROGRADE CHOLANGIOPANCREATOGRAPHY (ERCP);  Surgeon: Carol Ada, MD;  [facility] END[facility]COPY;  Service: Endoscopy;  Laterality: Left;  . EUS N/A 03/20/2016   Procedure: UPPER ENDOSCOPIC ULTRASOUND (EUS) LINEAR;  Surgeon: Carol Ada, MD;  Location: [facility] ENDOSCOPY;  Service: Endoscopy;  Laterality: N/A;  . HERNIA REPAIR    . KNEE ARTHROSCOPY     left and right    SOCIAL HISTORY: Social History   Social History  . Marital  status: Married    Spouse name: N/A  . Number of children: N/A  . Years of education: N/A   Occupational History  . Not on file.   Social History Main Topics  . Smoking status: Former Smoker    Packs/day: 1.00    Years: 15.00    Quit date: 08/12/1995  . Smokeless tobacco: Never Used  . Alcohol use Yes     Comment: he used to drink 6 pack beer a day for 40 years, quit in 02/2016   . Drug use: No  . Sexual activity: Not on file   Other Topics Concern  . Not on file   Social History Narrative  . No narrative on file    FAMILY HISTORY: Family History  Problem Relation Age of Onset  . Heart attack Mother     ALLERGIES:  has No Known Allergies.  MEDICATIONS:  Current Outpatient Prescriptions  Medication Sig Dispense Refill  . folic acid (FOLVITE) 1 MG tablet Take 1 tablet (1 mg total) by mouth daily. 30 tablet 0  . Insulin Glargine (LANTUS SOLOSTAR) 100 UNIT/ML Solostar Pen Inject 22 Units into the skin every morning.    . Pancrelipase, Lip-Prot-Amyl, (ZENPEP PO) Take 2 capsules by mouth 3 (three) times daily.    Marland Kitchen thiamine 100 MG tablet Take 1 tablet (100 mg total) by mouth daily. 30 tablet 0   No current facility-administered medications for this visit.     REVIEW OF SYSTEMS:   Constitutional: Denies fevers, chills or abnormal night sweats Eyes: Denies blurriness of vision, double vision or watery eyes Ears, nose, mouth, throat, and face: Denies mucositis or sore throat Respiratory: Denies cough, dyspnea or wheezes Cardiovascular: Denies palpitation, chest discomfort or lower extremity swelling Gastrointestinal:  Denies nausea, heartburn or change in bowel habits Skin: Denies abnormal skin rashes Lymphatics: Denies new lymphadenopathy or easy bruising Neurological:Denies numbness, tingling or new weaknesses Behavioral/Psych: Mood is stable, no new changes  All other systems were reviewed with the patient and are negative.  PHYSICAL EXAMINATION: ECOG PERFORMANCE  STATUS: 1 - Symptomatic but completely ambulatory  Vitals:   04/04/16 1118  BP: 126/86  Pulse: 88  Resp: 17  Temp: 98.4 F (36.9 C)   Filed Weights   04/04/16 1118  Weight: 194 lb 1.6 oz (88 kg)    GENERAL:alert, no distress and comfortable SKIN: skin color, texture, turgor are normal, no rashes or significant lesions EYES: normal, conjunctiva are pink and non-injected, sclera clear OROPHARYNX:no exudate, no erythema and lips, buccal mucosa, and tongue normal  NECK: supple, thyroid normal size, non-tender, without nodularity LYMPH:  no palpable lymphadenopathy in the cervical, axillary or inguinal LUNGS: clear to auscultation and percussion with normal breathing effort HEART: regular rate & rhythm and no murmurs and no lower extremity edema ABDOMEN:abdomen soft, non-tender and normal bowel sounds Musculoskeletal:no cyanosis of digits and no clubbing  PSYCH: alert & oriented x 3  with fluent speech NEURO: no focal motor/sensory deficits  LABORATORY DATA:  I have reviewed the data as listed CBC Latest Ref Rng & Units 04/03/2016 03/28/2016 03/14/2016  WBC 4.0 - 10.5 K/uL 5.6 5.7 6.5  Hemoglobin 13.0 - 17.0 g/dL 11.6(L) 11.8(L) 12.3(L)  Hematocrit 39.0 - 52.0 % 35.6(L) 37.1(L) 38.2(L)  Platelets 150 - 400 K/uL 299 313 208   CMP Latest Ref Rng & Units 04/03/2016 03/28/2016 03/14/2016  Glucose 65 - 99 mg/dL 480(H) 272(H) 178(H)  BUN 6 - 20 mg/dL 7 10.3 17  Creatinine 0.61 - 1.24 mg/dL 0.96 1.0 1.20  Sodium 135 - 145 mmol/L 131(L) 138 135  Potassium 3.5 - 5.1 mmol/L 3.8 3.7 3.9  Chloride 101 - 111 mmol/L 100(L) - 103  CO2 22 - 32 mmol/L 23 28 22   Calcium 8.9 - 10.3 mg/dL 8.8(L) 9.9 9.0  Total Protein 6.4 - 8.3 g/dL - 7.3 6.5  Total Bilirubin 0.20 - 1.20 mg/dL - 0.65 2.8(H)  Alkaline Phos 40 - 150 U/L - 196(H) 546(H)  AST 5 - 34 U/L - 12 156(H)  ALT 0 - 55 U/L - 26 557(H)   PATHOLOGY REPORT  Diagnosis 03/20/2016 FINE NEEDLE ASPIRATION, ENDOSCOPIC, PANCREAS HEAD (SPECIMEN 1 OF 1  COLLECTED 03/20/16): MALIGNANT CELLS CONSISTENT WITH ADENOCARCINOMA. Preliminary Diagnosis Intraoperative Diagnosis: Adequate. ([facility])  Diagnosis 03/13/2016 Duodenum, Biopsy, Duodenal mass - BENIGN ULCERATED AND INFLAMED SMALL BOWEL-TYPE MUCOSA. - THERE IS NO EVIDENCE OF MALIGNANCY. - SEE COMMENT. Microscopic Comment Multiple tissue levels have been examined. Dr. Claudette Laws has reviewed the case and concurs with this interpretation. Dr. Benson Norway was paged on 03/14/2016. (JBK:kh 03/14/16)  RADIOGRAPHIC STUDIES: I have personally reviewed the radiological images as listed and agreed with the findings in the report. Dg Chest 2 View  Result Date: 03/12/2016 CLINICAL DATA:  Chest and epigastric region pain EXAM: CHEST  2 VIEW COMPARISON:  None. FINDINGS: Lungs are clear. Heart size and pulmonary vascularity are normal. No adenopathy. There is degenerative change in the thoracic spine. IMPRESSION: No edema or consolidation. Electronically Signed   By: Lowella Grip III M.D.   On: 03/12/2016 10:10   Ct Chest W Contrast  Result Date: 04/03/2016 CLINICAL DATA:  Patient with history of pancreatic carcinoma. Staging examination. EXAM: CT CHEST WITH CONTRAST TECHNIQUE: Multidetector CT imaging of the chest was performed during intravenous contrast administration. CONTRAST:  26mL ISOVUE-300 IOPAMIDOL (ISOVUE-300) INJECTION 61% COMPARISON:  CT abdomen pelvis 03/12/2016 FINDINGS: Cardiovascular: Normal heart size. No pericardial effusion. Coronary arterial vascular calcifications. Aorta and main pulmonary artery are normal in caliber. Mediastinum/Nodes: No axillary, mediastinal or hilar lymphadenopathy. Esophagus is unremarkable. Lungs/Pleura: Central airways are patent. No large area of pulmonary consolidation. No pleural effusion or pneumothorax. No discrete pulmonary nodules identified. Upper Abdomen: Biliary stent is partially visualized. Small amount of pneumobilia. Mild irregularity and surrounding fat  stranding about the visualized aspect of the pancreatic head. Normal adrenal glands. Musculoskeletal: 9 mm sclerotic lesion within the posterior left second rib (image 12; series 4). 8 mm sclerotic lesion within the posterior left third rib (image 21; series 4). Thoracic spine degenerative changes. IMPRESSION: No discrete pulmonary nodules or areas of pulmonary consolidation involving the lungs. Nonspecific sclerotic lesions within the posterior left second and third ribs may represent bone islands. Metastasis are not entirely excluded. Consider correlation with bone scan as clinically indicated. Interval insertion of biliary stent. The known pancreatic head mass is not completely included on current evaluation. Electronically Signed   By: Polly Cobia.D.  On: 04/03/2016 09:24   US Abdomen Complete  Result Date: 03/12/2016 CLINICAL DATA:  Chest pain.  Abnormal LFTs. EXAM: ABDOMEN ULTRASOUND COMPLETE COMPARISON:  None. FINDINGS: Gallbladder: No gallstones or wall thickening visualized. No sonographic Murphy sign noted by sonographer. Moderate distention, normal wall thickness. Common bile duct: Diameter: Increased, between 7.3 and 7.9 mm. Liver: No focal lesion identified. Within normal limits in parenchymal echogenicity. Intrahepatic biliary ductal dilatation is present. IVC: No abnormality visualized. Pancreas: Increased size of the pancreatic duct, up to 4 mm. No mass is evident. Spleen: Size and appearance within normal limits. Right Kidney: Length: 9.9 cm. Echogenicity within normal limits. No mass or hydronephrosis visualized. Left Kidney: Length: 11.8 cm. Echogenicity within normal limits. No mass or hydronephrosis visualized. Two 1 cm simple cysts are noted in the kidney, non worrisome Abdominal aorta: No aneurysm visualized. Other findings: None. IMPRESSION: Intra and extrahepatic biliary ductal dilatation is present, along with abnormal dilatation of the pancreatic duct. CBD up to 8 mm and PD  approximately 4 mm. Concern for ampullary mass or stone. CT abdomen and pelvis with contrast recommended for further evaluation. Electronically Signed   By: Staci Righter M.D.   On: 03/12/2016 12:22   Ct Abdomen Pelvis W Contrast  Result Date: 03/12/2016 CLINICAL DATA:  Epigastric pain off and on for 1 month, abnormal bile duct by ultrasound EXAM: CT ABDOMEN AND PELVIS WITH CONTRAST TECHNIQUE: Multidetector CT imaging of the abdomen and pelvis was performed using the standard protocol following bolus administration of intravenous contrast. Sagittal and coronal MPR images reconstructed from axial data set. CONTRAST:  172mL ISOVUE-300 IOPAMIDOL (ISOVUE-300) INJECTION 61% IV. No oral contrast administered. COMPARISON:  None; correlation ultrasound abdomen 03/12/2016 FINDINGS: Lower chest:  Lung bases clear Hepatobiliary: Liver normal appearance. Small fold at gallbladder fundus, gallbladder otherwise unremarkable. Prominent CBD 10 mm diameter on coronal images. No significant intrahepatic biliary dilatation. Pancreas: Somewhat ill defined low-attenuation mass at head/uncinate of pancreas, 3.1 x 2.0 x 2.6 cm in size highly suspicious for a pancreatic neoplasm. Preserved fat planes between the pancreatic mass, SMA, and SMV. Mass abuts the second and third portions of duodenum. Question loss of fat plane between the posterior aspect of mass and IVC. No pancreatic ductal dilatation or atrophy. No pancreatic calcifications or additional fluid collections. Spleen: Normal appearance Adrenals/Urinary Tract: Adrenal glands normal appearance. Tiny RIGHT renal cyst. Kidneys, ureters, and bladder otherwise normal appearance. Stomach/Bowel: Normal appendix. Stomach and bowel loops normal appearance. Vascular/Lymphatic: Atherosclerotic calcifications aorta and iliac arteries. Coronary arterial calcifications. No definite adenopathy. Reproductive: N/A Other: Nodular irregularity of the anterior abdominal wall at the umbilicus  question prior umblical hernia repair. Tiny LEFT parasagittal periumbilical hernia. No free air or free fluid. Musculoskeletal: Osseous structures unremarkable. IMPRESSION: Mildly dilated CBD 10 mm diameter without intrahepatic biliary dilatation. 3.1 x 2.0 x 2.6 cm diameter mass at head/uncinate process of pancreas highly suspicious for pancreatic neoplasm. Findings called to Dr. Tomi Bamberger on 03/12/2016 at 1353 hours. Electronically Signed   By: Lavonia Dana M.D.   On: 03/12/2016 13:54   Dg Ercp Biliary & Pancreatic Ducts  Result Date: 03/14/2016 CLINICAL DATA:  Biliary dilatation and pancreatic head mass. EXAM: ERCP TECHNIQUE: Multiple spot images obtained with the fluoroscopic device and submitted for interpretation post-procedure. FLUOROSCOPY TIME:  If the device does not provide the exposure index: Fluoroscopy Time:  2 minutes and 11 seconds Number of Acquired Images:  4 COMPARISON:  CT 03/12/2016 FINDINGS: Wire was advanced into the main pancreatic duct and common bile  duct. Opacification of the biliary system demonstrates narrowing or obstruction in the distal common bile duct. Mild dilatation of the extrahepatic biliary system. A metallic stent was placed in the distal common bile duct. IMPRESSION: Placement of metallic biliary stent for treatment of the distal common bile duct obstruction. These images were submitted for radiologic interpretation only. Please see the procedural report for the amount of contrast and the fluoroscopy time utilized. Electronically Signed   By: Markus Daft M.D.   On: 03/14/2016 17:41   EUS Dr. Benson Norway  Endosonographic Finding 03/20/2016 Findings: An irregular mass was identified in the pancreatic head. The mass was hypoechoic. The mass measured 39 mm by 32 mm in maximal cross-sectional diameter. The outer margins were irregular. An intact interface was seen between the mass and the celiac trunk and portal vein suggesting a lack of invasion. Fine needle aspiration for cytology  was performed. Color Doppler imaging was utilized prior to needle puncture to confirm a lack of significant vascular structures within the needle path. Five passes were made with the 25 gauge needle using a transduodenal approach. A stylet was used. A cytotechnologist was present to evaluate the adequacy of the specimen. The cellularity of the specimen was adequate. Final cytology results are pending. - A mass was identified in the pancreatic head. Tissue was obtained from this exam, and results are pending. However, the endosonographic appearance is highly suspicious for adenocarcinoma. Fine needle aspiration performed.  ERCP 03/13/2016 Impression: - One covered metal stent was placed into the common bile duct.   ASSESSMENT & PLAN: 60 year old African-American male, with past medical history of diabetes and hypertension, presented with epigastric pain, weight loss, and jaundice.  1. Primary pancreatic adenocarcinoma, in pancreas head, cT2N0M0, stage IB, resectable  -I have reviewed his CT abdomen and pelvis with contrast, EUS and biopsy findings with patient and his wife in great details. -His case was reviewed in our GI tumor board a few days ago. Post CT scan and US showed no evidence of vascular invasion from the pancreatic tumor, this is resectable disease. -CT chest was negative for metastatic disease. I reviewed the results with patient. -We reviewed the nature history of pancreatic cancer, and the overall survival rate with chemotherapy, surgery, and radiation. Patient and his wife was discouraged by the overall dismal long term survival rate (~20%)  -He was seen by surgeon Dr. Barry Dienes, Whipple surgery was discussed and offered to patient. Patient is little reluctant to have the high risk Whipple surgery, will think about it.  -I reviewed the high risk of cancer recurrence after surgery, and the role of neoadjuvant and adjuvant chemotherapy to reduce the risk of recurrence, and shrink the  tumor before surgery. -I discussed with Dr. Barry Dienes, we recommend patient to have new adjuvant chemotherapy first. -Pt declined clinical trial SWOG S1505. -Patient opted him Saturday and Abraxane regimen, he is not comfortable with 5-FU pump infusion, and is concerned about more side effects from FOLFIRINOX --Chemotherapy consent: Side effects including but does not not limited to, fatigue, nausea, vomiting, diarrhea, hair loss, neuropathy, fluid retention, renal and kidney dysfunction, neutropenic fever, needed for blood transfusion, bleeding, were discussed with patient in great detail. He agrees to proceed. -He is scheduled to have port placement on Monday, August 28, and we'll start chemotherapy next week.   2. HTN and DM - he will continue medication and follow up with his primary care physician  -We reviewed her that his blood pressure and blood glucose will need to be  monitored closely during the chemotherapy, and his medication may need to be adjusted  -His diabetes is poorly controlled, he is currently on insulin, I started recommend him to monitor his sugar closely at home and adjust his insulin as needed  3. Malnutrition -He has lost significant amount weight, albumin is low, he was seen by our dietitian Pamala Hurry and will follow up  -We'll watch his weight and nutrition status closely when he is on chemotherapy  Plan -CT chest findings were reviewed with patient -port placement on 8/28 -first cycle chemo gemcitabine and Abaraxane early next week, and continue weekly for 3 weeks on and one week off -I'll see him before the second week chemotherapy.  All questions were answered. The patient knows to call the clinic with any problems, questions or concerns. I spent 25 minutes counseling the patient face to face. The total time spent in the appointment was 30 minutes and more than 50% was on counseling.     Truitt Merle, MD 04/04/2016

## 2016-04-05 ENCOUNTER — Encounter: Payer: Self-pay | Admitting: Hematology

## 2016-04-05 NOTE — Addendum Note (Signed)
Addended by: Truitt Merle on: 04/05/2016 05:57 PM   Modules accepted: Orders

## 2016-04-07 ENCOUNTER — Telehealth: Payer: Self-pay | Admitting: *Deleted

## 2016-04-07 ENCOUNTER — Ambulatory Visit (HOSPITAL_COMMUNITY)
Admission: RE | Admit: 2016-04-07 | Discharge: 2016-04-07 | Disposition: A | Discharge status: Home / Self Care | Payer: BLUE CROSS/BLUE SHIELD | Source: Ambulatory Visit | Attending: General Surgery | Admitting: General Surgery

## 2016-04-07 ENCOUNTER — Ambulatory Visit (HOSPITAL_COMMUNITY): Payer: BLUE CROSS/BLUE SHIELD

## 2016-04-07 ENCOUNTER — Ambulatory Visit (HOSPITAL_COMMUNITY): Payer: BLUE CROSS/BLUE SHIELD | Admitting: Vascular Surgery

## 2016-04-07 ENCOUNTER — Ambulatory Visit (HOSPITAL_COMMUNITY): Payer: BLUE CROSS/BLUE SHIELD | Admitting: Certified Registered Nurse Anesthetist

## 2016-04-07 ENCOUNTER — Encounter (HOSPITAL_COMMUNITY)
Admission: RE | Disposition: A | Discharge status: Home / Self Care | Payer: Self-pay | Source: Ambulatory Visit | Attending: General Surgery

## 2016-04-07 ENCOUNTER — Encounter: Payer: Self-pay | Admitting: *Deleted

## 2016-04-07 ENCOUNTER — Encounter (HOSPITAL_COMMUNITY): Payer: Self-pay | Admitting: *Deleted

## 2016-04-07 DIAGNOSIS — E119 Type 2 diabetes mellitus without complications: Secondary | ICD-10-CM | POA: Diagnosis not present

## 2016-04-07 DIAGNOSIS — Z79899 Other long term (current) drug therapy: Secondary | ICD-10-CM | POA: Diagnosis not present

## 2016-04-07 DIAGNOSIS — Z794 Long term (current) use of insulin: Secondary | ICD-10-CM | POA: Diagnosis not present

## 2016-04-07 DIAGNOSIS — Z6827 Body mass index (BMI) 27.0-27.9, adult: Secondary | ICD-10-CM | POA: Insufficient documentation

## 2016-04-07 DIAGNOSIS — I10 Essential (primary) hypertension: Secondary | ICD-10-CM | POA: Diagnosis not present

## 2016-04-07 DIAGNOSIS — Z791 Long term (current) use of non-steroidal anti-inflammatories (NSAID): Secondary | ICD-10-CM | POA: Diagnosis not present

## 2016-04-07 DIAGNOSIS — C250 Malignant neoplasm of head of pancreas: Secondary | ICD-10-CM | POA: Insufficient documentation

## 2016-04-07 DIAGNOSIS — Z87891 Personal history of nicotine dependence: Secondary | ICD-10-CM | POA: Insufficient documentation

## 2016-04-07 DIAGNOSIS — C259 Malignant neoplasm of pancreas, unspecified: Secondary | ICD-10-CM

## 2016-04-07 DIAGNOSIS — Z95828 Presence of other vascular implants and grafts: Secondary | ICD-10-CM

## 2016-04-07 DIAGNOSIS — E43 Unspecified severe protein-calorie malnutrition: Secondary | ICD-10-CM | POA: Diagnosis not present

## 2016-04-07 HISTORY — PX: PORTACATH PLACEMENT: SHX2246

## 2016-04-07 HISTORY — DX: Type 2 diabetes mellitus without complications: E11.9

## 2016-04-07 LAB — GLUCOSE, CAPILLARY
Glucose-Capillary: 216 mg/dL — ABNORMAL HIGH (ref 65–99)
Glucose-Capillary: 202 mg/dL — ABNORMAL HIGH (ref 65–99)

## 2016-04-07 SURGERY — INSERTION, TUNNELED CENTRAL VENOUS DEVICE, WITH PORT
Anesthesia: General | Site: Chest

## 2016-04-07 MED ORDER — SODIUM CHLORIDE 0.9% FLUSH: 3.0000 mL | Freq: Two times a day (BID) | INTRAVENOUS | Status: DC

## 2016-04-07 MED ORDER — 0.9 % SODIUM CHLORIDE (POUR BTL) OPTIME
TOPICAL | Status: DC | PRN
  Administered 2016-04-07: 1000 mL

## 2016-04-07 MED ORDER — HYDROMORPHONE HCL 1 MG/ML IJ SOLN: 0.2500 mg | INTRAMUSCULAR | Status: DC | PRN

## 2016-04-07 MED ORDER — MEPERIDINE HCL 25 MG/ML IJ SOLN: 6.2500 mg | INTRAMUSCULAR | Status: DC | PRN

## 2016-04-07 MED ORDER — LIDOCAINE HCL (CARDIAC) 20 MG/ML IV SOLN
INTRAVENOUS | Status: DC | PRN
  Administered 2016-04-07: 80 mg via INTRAVENOUS

## 2016-04-07 MED ORDER — PHENYLEPHRINE HCL 10 MG/ML IJ SOLN
INTRAVENOUS | Status: DC | PRN
  Administered 2016-04-07: 25 ug/min via INTRAVENOUS

## 2016-04-07 MED ORDER — PROPOFOL 10 MG/ML IV BOLUS
INTRAVENOUS | Status: AC
  Filled 2016-04-07: qty 40

## 2016-04-07 MED ORDER — ONDANSETRON HCL 4 MG/2ML IJ SOLN
INTRAMUSCULAR | Status: DC | PRN
  Administered 2016-04-07: 4 mg via INTRAVENOUS

## 2016-04-07 MED ORDER — FENTANYL CITRATE (PF) 100 MCG/2ML IJ SOLN
INTRAMUSCULAR | Status: AC
  Filled 2016-04-07: qty 2

## 2016-04-07 MED ORDER — MIDAZOLAM HCL 5 MG/5ML IJ SOLN
INTRAMUSCULAR | Status: DC | PRN
  Administered 2016-04-07: 2 mg via INTRAVENOUS

## 2016-04-07 MED ORDER — OXYCODONE HCL 5 MG PO TABS: 5.0000 mg | ORAL_TABLET | Freq: Four times a day (QID) | ORAL | 0 refills | Status: DC | PRN

## 2016-04-07 MED ORDER — FENTANYL CITRATE (PF) 100 MCG/2ML IJ SOLN: 25.0000 ug | INTRAMUSCULAR | Status: DC | PRN

## 2016-04-07 MED ORDER — PROPOFOL 10 MG/ML IV BOLUS
INTRAVENOUS | Status: DC | PRN
  Administered 2016-04-07: 200 mg via INTRAVENOUS

## 2016-04-07 MED ORDER — LACTATED RINGERS IV SOLN: INTRAVENOUS | Status: DC

## 2016-04-07 MED ORDER — FENTANYL CITRATE (PF) 100 MCG/2ML IJ SOLN
INTRAMUSCULAR | Status: DC | PRN
  Administered 2016-04-07: 50 ug via INTRAVENOUS

## 2016-04-07 MED ORDER — SODIUM CHLORIDE 0.9% FLUSH: 3.0000 mL | INTRAVENOUS | Status: DC | PRN

## 2016-04-07 MED ORDER — LACTATED RINGERS IV SOLN
INTRAVENOUS | Status: DC | PRN
  Administered 2016-04-07: 07:00:00 via INTRAVENOUS

## 2016-04-07 MED ORDER — OXYCODONE HCL 5 MG PO TABS: 5.0000 mg | ORAL_TABLET | ORAL | Status: DC | PRN

## 2016-04-07 MED ORDER — HEPARIN SOD (PORK) LOCK FLUSH 100 UNIT/ML IV SOLN
INTRAVENOUS | Status: AC
  Filled 2016-04-07: qty 5

## 2016-04-07 MED ORDER — ONDANSETRON HCL 4 MG/2ML IJ SOLN: 4.0000 mg | Freq: Once | INTRAMUSCULAR | Status: DC | PRN

## 2016-04-07 MED ORDER — ACETAMINOPHEN 325 MG PO TABS: 650.0000 mg | ORAL_TABLET | ORAL | Status: DC | PRN

## 2016-04-07 MED ORDER — SODIUM CHLORIDE 0.9 % IV SOLN: 250.0000 mL | INTRAVENOUS | Status: DC | PRN

## 2016-04-07 MED ORDER — PHENYLEPHRINE HCL 10 MG/ML IJ SOLN
INTRAMUSCULAR | Status: DC | PRN
  Administered 2016-04-07 (×2): 100 ug via INTRAVENOUS

## 2016-04-07 MED ORDER — MIDAZOLAM HCL 2 MG/2ML IJ SOLN
INTRAMUSCULAR | Status: AC
  Filled 2016-04-07: qty 2

## 2016-04-07 MED ORDER — HEPARIN SOD (PORK) LOCK FLUSH 100 UNIT/ML IV SOLN
INTRAVENOUS | Status: DC | PRN
  Administered 2016-04-07: 500 [IU] via INTRAVENOUS

## 2016-04-07 MED ORDER — LIDOCAINE HCL (PF) 1 % IJ SOLN
INTRAMUSCULAR | Status: AC
  Filled 2016-04-07: qty 30

## 2016-04-07 MED ORDER — BUPIVACAINE-EPINEPHRINE (PF) 0.25% -1:200000 IJ SOLN
INTRAMUSCULAR | Status: AC
  Filled 2016-04-07: qty 30

## 2016-04-07 MED ORDER — SODIUM CHLORIDE 0.9 % IV SOLN
INTRAVENOUS | Status: DC | PRN
  Administered 2016-04-07: 08:00:00

## 2016-04-07 MED ORDER — METOCLOPRAMIDE HCL 5 MG/ML IJ SOLN: 10.0000 mg | Freq: Once | INTRAMUSCULAR | Status: DC | PRN

## 2016-04-07 MED ORDER — ACETAMINOPHEN 650 MG RE SUPP: 650.0000 mg | RECTAL | Status: DC | PRN

## 2016-04-07 MED ORDER — LIDOCAINE HCL 1 % IJ SOLN
INTRAMUSCULAR | Status: DC | PRN
  Administered 2016-04-07: 10 mL

## 2016-04-07 SURGICAL SUPPLY — 56 items
ADH SKN CLS APL DERMABOND .7 (GAUZE/BANDAGES/DRESSINGS) ×1
BAG DECANTER FOR FLEXI CONT (MISCELLANEOUS) ×3 IMPLANT
BLADE SURG 11 STRL SS (BLADE) ×3 IMPLANT
BLADE SURG 15 STRL LF DISP TIS (BLADE) ×1 IMPLANT
BLADE SURG 15 STRL SS (BLADE) ×3
CANISTER SUCTION 2500CC (MISCELLANEOUS) IMPLANT
CHLORAPREP W/TINT 10.5 ML (MISCELLANEOUS) ×3 IMPLANT
COVER SURGICAL LIGHT HANDLE (MISCELLANEOUS) ×3 IMPLANT
COVER TRANSDUCER ULTRASND GEL (DRAPE) IMPLANT
CRADLE DONUT ADULT HEAD (MISCELLANEOUS) ×3 IMPLANT
DECANTER SPIKE VIAL GLASS SM (MISCELLANEOUS) ×6 IMPLANT
DERMABOND ADVANCED (GAUZE/BANDAGES/DRESSINGS) ×2
DERMABOND ADVANCED .7 DNX12 (GAUZE/BANDAGES/DRESSINGS) ×1 IMPLANT
DRAPE C-ARM 42X72 X-RAY (DRAPES) ×3 IMPLANT
DRAPE CHEST BREAST 15X10 FENES (DRAPES) ×3 IMPLANT
DRAPE UTILITY XL STRL (DRAPES) ×6 IMPLANT
DRAPE WARM FLUID 44X44 (DRAPE) IMPLANT
DRSG TEGADERM 4X4.75 (GAUZE/BANDAGES/DRESSINGS) ×4 IMPLANT
ELECT COATED BLADE 2.86 ST (ELECTRODE) ×3 IMPLANT
ELECT REM PT RETURN 9FT ADLT (ELECTROSURGICAL) ×3
ELECTRODE REM PT RTRN 9FT ADLT (ELECTROSURGICAL) ×1 IMPLANT
GAUZE SPONGE 4X4 16PLY XRAY LF (GAUZE/BANDAGES/DRESSINGS) ×3 IMPLANT
GEL ULTRASOUND 20GR AQUASONIC (MISCELLANEOUS) IMPLANT
GLOVE BIO SURGEON STRL SZ 6 (GLOVE) ×3 IMPLANT
GLOVE BIOGEL PI IND STRL 6.5 (GLOVE) ×1 IMPLANT
GLOVE BIOGEL PI IND STRL 7.0 (GLOVE) IMPLANT
GLOVE BIOGEL PI IND STRL 7.5 (GLOVE) IMPLANT
GLOVE BIOGEL PI INDICATOR 6.5 (GLOVE) ×2
GLOVE BIOGEL PI INDICATOR 7.0 (GLOVE) ×2
GLOVE BIOGEL PI INDICATOR 7.5 (GLOVE) ×2
GLOVE SURG SS PI 7.0 STRL IVOR (GLOVE) ×2 IMPLANT
GOWN STRL REUS W/ TWL LRG LVL3 (GOWN DISPOSABLE) ×1 IMPLANT
GOWN STRL REUS W/TWL 2XL LVL3 (GOWN DISPOSABLE) ×3 IMPLANT
GOWN STRL REUS W/TWL LRG LVL3 (GOWN DISPOSABLE) ×3
KIT BASIN OR (CUSTOM PROCEDURE TRAY) ×3 IMPLANT
KIT POWER CATH 8FR (Port) ×2 IMPLANT
KIT ROOM TURNOVER OR (KITS) ×3 IMPLANT
NDL HYPO 25GX1X1/2 BEV (NEEDLE) ×1 IMPLANT
NEEDLE HYPO 25GX1X1/2 BEV (NEEDLE) ×3 IMPLANT
NS IRRIG 1000ML POUR BTL (IV SOLUTION) ×3 IMPLANT
PACK SURGICAL SETUP 50X90 (CUSTOM PROCEDURE TRAY) ×3 IMPLANT
PAD ARMBOARD 7.5X6 YLW CONV (MISCELLANEOUS) ×3 IMPLANT
PENCIL BUTTON HOLSTER BLD 10FT (ELECTRODE) ×3 IMPLANT
SPONGE GAUZE 4X4 12PLY STER LF (GAUZE/BANDAGES/DRESSINGS) ×2 IMPLANT
SUT MON AB 4-0 PC3 18 (SUTURE) ×3 IMPLANT
SUT PROLENE 2 0 SH DA (SUTURE) ×6 IMPLANT
SUT VIC AB 3-0 SH 27 (SUTURE) ×3
SUT VIC AB 3-0 SH 27X BRD (SUTURE) ×1 IMPLANT
SYR 20ML ECCENTRIC (SYRINGE) ×6 IMPLANT
SYR 5ML LUER SLIP (SYRINGE) ×3 IMPLANT
SYR CONTROL 10ML LL (SYRINGE) ×3 IMPLANT
TOWEL OR 17X24 6PK STRL BLUE (TOWEL DISPOSABLE) ×3 IMPLANT
TOWEL OR 17X26 10 PK STRL BLUE (TOWEL DISPOSABLE) ×3 IMPLANT
TUBE CONNECTING 12'X1/4 (SUCTIONS)
TUBE CONNECTING 12X1/4 (SUCTIONS) IMPLANT
YANKAUER SUCT BULB TIP NO VENT (SUCTIONS) IMPLANT

## 2016-04-07 NOTE — Telephone Encounter (Signed)
Oncology Nurse Navigator Documentation  Oncology Nurse Navigator Flowsheets 04/07/2016  Navigator Location [facility]  Navigator Encounter Type Telephone  Telephone Outgoing Call;Appt Confirmation/Clarification--  Abnormal Finding Date -  Confirmed Diagnosis Date -  Patient Visit Type -  Treatment Phase -  Barriers/Navigation Needs Coordination of Care--chemo on 8/30 at 0915 (due to insurance authorization pending).   Education Other--needs port dressing changed and biopatch applied to prevent infection since port accesed for >24 hours.  Interventions Coordination of Care--made him aware of chemo date/time   Coordination of Care Appts--made appointment with flush nurse tomorrow at 3:00 pm  Education Method Verbal  Support Groups/Services -  Acuity -  Time Spent with Patient 15

## 2016-04-07 NOTE — Op Note (Signed)
PREOPERATIVE DIAGNOSIS:  Pancreatic cancer     POSTOPERATIVE DIAGNOSIS:  Same     PROCEDURE: Left subclavian port placement, Bard ClearVue  Power Port, MRI safe, 8-French.      SURGEON:  Stark Klein, MD      ANESTHESIA:  General   FINDINGS:  Good venous return, easy flush, and tip of the catheter and   SVC 25.5 cm.      SPECIMEN:  None.      ESTIMATED BLOOD LOSS:  Minimal.      COMPLICATIONS:  None known.      PROCEDURE:  Pt was identified in the holding area and taken to   the operating room, where patient was placed supine on the operating room   table.  General anesthesia was induced.  Patient's arms were tucked and the upper   chest and neck were prepped and draped in sterile fashion.  Time-out was   performed according to the surgical safety check list.  When all was   correct, we continued.   Local anesthetic was administered over this   area at the angle of the clavicle.  The vein was accessed with 2 passes of the needle. There was good venous return and the wire passed easily with no ectopy.   Fluoroscopy was used to confirm that the wire was in the vena cava.      The patient was placed back level and the area for the pocket was anethetized   with local anesthetic.  A 3-cm transverse incision was made with a #15   blade.  Cautery was used to divide the subcutaneous tissues down to the   pectoralis muscle.  An [employer] retractor was used to elevate the skin   while a pocket was created on top of the pectoralis fascia.  The port   was placed into the pocket to confirm that it was of adequate size.  The   catheter was preattached to the port.  The port was then secured to the   pectoralis fascia with four 2-0 Prolene sutures.  These were clamped and   not tied down yet.    The catheter was tunneled through to the wire exit   site.  The catheter was placed along the wire to determine what length it should be to be in the SVC.  The catheter was cut at 25.5 cm.  The  tunneler sheath and dilator were passed over the wire and the dilator and wire were removed.  The catheter was advanced through the tunneler sheath and the tunneler sheath was pulled away.  Care was taken to keep the catheter in the tunneler sheath as this occurred. This was advanced and the tunneler sheath was removed.  There was good venous   return and easy flush of the catheter.  The Prolene sutures were tied   down to the pectoral fascia.  The skin was reapproximated using 3-0   Vicryl interrupted deep dermal sutures.    Fluoroscopy was used to re-confirm good position of the catheter.  The skin   was then closed using 4-0 Monocryl in a subcuticular fashion.  The port was flushed with concentrated heparin flush as well.  The wounds were then cleaned, dried, and dressed with Dermabond.  The patient was awakened from anesthesia and taken to the PACU in stable condition.  Needle, sponge, and instrument counts were correct.               Stark Klein, MD

## 2016-04-07 NOTE — Telephone Encounter (Signed)
Called pt & informed of appt for Wed @ 9:15 am

## 2016-04-07 NOTE — Interval H&P Note (Signed)
History and Physical Interval Note:  04/07/2016 7:32 AM  Craig [name]  has presented today for surgery, with the diagnosis of pancreatic cancer  The various methods of treatment have been discussed with the patient and family. After consideration of risks, benefits and other options for treatment, the patient has consented to  Procedure(s): INSERTION PORT-A-CATH (N/A) as a surgical intervention .  The patient's history has been reviewed, patient examined, no change in status, stable for surgery.  I have reviewed the patient's chart and labs.  Questions were answered to the patient's satisfaction.     Torrance Frech

## 2016-04-07 NOTE — Transfer of Care (Signed)
Immediate Anesthesia Transfer of Care Note  Patient: Craig [name]  Procedure(s) Performed: Procedure(s): INSERTION PORT-A-CATH (N/A)  Patient Location: PACU  Anesthesia Type:General  Level of Consciousness: awake, alert , oriented and patient cooperative  Airway & Oxygen Therapy: Patient Spontanous Breathing and Patient connected to nasal cannula oxygen  Post-op Assessment: Report given to RN and Post -op Vital signs reviewed and stable  Post vital signs: Reviewed and stable  Last Vitals:  Vitals:   04/07/16 0717 04/07/16 0858  BP: (!) 147/85   Pulse: 76   Resp: 18   Temp: 36.9 C (P) 36.6 C    Last Pain:  Vitals:   04/07/16 0717  TempSrc: Oral         Complications: No apparent anesthesia complications

## 2016-04-07 NOTE — Progress Notes (Signed)
Oncology Nurse Navigator Documentation  Oncology Nurse Navigator Flowsheets 04/07/2016  Navigator Location [facility]  Navigator Encounter Type Letter/Fax/Email;Telephone--message from Dr. Barry Dienes that patient asking when his 1st tx will be? She left his port accessed today.  Telephone Outgoing Call--called patient to inform him navigator will call him when his chemo is scheduled. Hoping for 8/29.  Abnormal Finding Date -  Confirmed Diagnosis Date -  Patient Visit Type -  Treatment Phase -  Barriers/Navigation Needs Coordination of Care--chemo authorization and appointment  Education -  Interventions Coordination of Care--message to chemo scheduler for appointment 8/29 and email to Elliot Gault at [place] to complete authorization.  Coordination of Care Chemo;Appts  Education Method -  Support Groups/Services -  Acuity -  Time Spent with Patient 15

## 2016-04-07 NOTE — Anesthesia Procedure Notes (Signed)
Procedure Name: LMA Insertion Date/Time: 04/07/2016 7:50 AM Performed by: Oletta Lamas Pre-anesthesia Checklist: Patient identified, Emergency Drugs available, Suction available and Patient being monitored Patient Re-evaluated:Patient Re-evaluated prior to inductionOxygen Delivery Method: Circle System Utilized Preoxygenation: Pre-oxygenation with 100% oxygen Intubation Type: IV induction Ventilation: Mask ventilation without difficulty LMA: LMA inserted LMA Size: 5.0 Number of attempts: 2 Airway Equipment and Method: Bite block Placement Confirmation: positive ETCO2 Tube secured with: Tape Dental Injury: Teeth and Oropharynx as per pre-operative assessment  Comments: Attempted to place #4LMA.  Large leak.  Switched for number #5LMA.  Saturation remained 100% during induction. Tolerated well.

## 2016-04-07 NOTE — Telephone Encounter (Signed)
Treatment not authorized, appt moved from 8/28 to 8/29. GI navigator will call the patient.

## 2016-04-07 NOTE — Anesthesia Preprocedure Evaluation (Addendum)
Anesthesia Evaluation  Patient identified by MRN, date of birth, ID band Patient awake    Reviewed: Allergy & Precautions, NPO status , Patient's Chart, lab work & pertinent test results  Airway Mallampati: II  TM Distance: >3 FB Neck ROM: Full    Dental no notable dental hx.    Pulmonary former smoker,    Pulmonary exam normal breath sounds clear to auscultation       Cardiovascular hypertension, Normal cardiovascular exam Rhythm:Regular Rate:Normal     Neuro/Psych negative neurological ROS  negative psych ROS   GI/Hepatic negative GI ROS, (+)     substance abuse  alcohol use,   Endo/Other  diabetes, Insulin DependentPancreatic cancer  Renal/GU negative Renal ROS  negative genitourinary   Musculoskeletal negative musculoskeletal ROS (+)   Abdominal   Peds negative pediatric ROS (+)  Hematology negative hematology ROS (+)   Anesthesia Other Findings   Reproductive/Obstetrics negative OB ROS                             Anesthesia Physical Anesthesia Plan  ASA: II  Anesthesia Plan: General   Post-op Pain Management:    Induction: Intravenous  Airway Management Planned: LMA  Additional Equipment:   Intra-op Plan:   Post-operative Plan: Extubation in OR  Informed Consent: I have reviewed the patients History and Physical, chart, labs and discussed the procedure including the risks, benefits and alternatives for the proposed anesthesia with the patient or authorized representative who has indicated his/her understanding and acceptance.   Dental advisory given  Plan Discussed with: CRNA  Anesthesia Plan Comments:         Anesthesia Quick Evaluation

## 2016-04-07 NOTE — Anesthesia Postprocedure Evaluation (Signed)
Anesthesia Post Note  Patient: Craig [name]  Procedure(s) Performed: Procedure(s) (LRB): INSERTION PORT-A-CATH (N/A)  Patient location during evaluation: PACU Anesthesia Type: General Level of consciousness: awake and alert Pain management: pain level controlled Vital Signs Assessment: post-procedure vital signs reviewed and stable Respiratory status: spontaneous breathing, nonlabored ventilation, respiratory function stable and patient connected to nasal cannula oxygen Cardiovascular status: blood pressure returned to baseline and stable Postop Assessment: no signs of nausea or vomiting Anesthetic complications: no    Last Vitals:  Vitals:   04/07/16 0930 04/07/16 0941  BP: 139/80 (!) 152/95  Pulse: 78 79  Resp: (!) 0 16  Temp: 36.7 C     Last Pain:  Vitals:   04/07/16 0717  TempSrc: Oral                 [name]

## 2016-04-07 NOTE — H&P (Signed)
Craig [name] 03/28/2016 8:59 AM Location: [facility] Patient #: [id number] DOB: 07/21/1956 Undefined / Language: [name] / Race: Black or African American Male   History of Present Illness Stark Klein MD; 03/28/2016 10:30 AM) The patient is a 60 year old male who presents with pancreatic cancer. Patient is a 60 year old male who is referred by Dr. Benson Norway with new diagnosis of adenocarcinoma of the pancreatic head. He had not been feeling well and has been losing weight over the last few months. His primary doctor had been following labs. When his liver function tests were elevated, he was sent directly to the hospital. He underwent ERCP with metal stent placement and endoscopic ultrasound. Is found to have a 3.1 cm mass in the head/uncinate process. There is preserved fat plane between all the vessels. It did appear to potentially invade her about the duodenal mucosa. EUS appeared to have the lesion more at 3.9 mm. There was no suggestion of lymphatic involvement on any of his studies. He was drinking around a sixpack of beer per day until around 3 days prior to admission to the hospital. He has not been drinking since then. He smoked around 20 years ago but has not smoked for quite a long time. His CA-19-9 was 158. Weight loss was around 30-40 pounds. He was diagnosed with diabetes in 2010. He was first on pills for this but was placed on insulin around 2 years ago. He has no family history of any cancers other than skin cancer in father.    FNA cytology 03/20/16 Diagnosis FINE NEEDLE ASPIRATION, ENDOSCOPIC, PANCREAS HEAD (SPECIMEN 1 OF 1 COLLECTED 03/20/16): MALIGNANT CELLS CONSISTENT WITH ADENOCARCINOMA.   03/14/16  CA 19-9 158 T Bili 2.8, albumin 2.8, Cr 1.2 HCT 38.2  Ct abd/pelvis 03/12/16 Hepatobiliary: Liver normal appearance. Small fold at gallbladder fundus, gallbladder otherwise unremarkable. Prominent CBD 10 mm diameter on coronal images. No significant intrahepatic  biliary dilatation.  Pancreas: Somewhat ill defined low-attenuation mass at head/uncinate of pancreas, 3.1 x 2.0 x 2.6 cm in size highly suspicious for a pancreatic neoplasm. Preserved fat planes between the pancreatic mass, SMA, and SMV. Mass abuts the second and third portions of duodenum. Question loss of fat plane between the posterior aspect of mass and IVC. No pancreatic ductal dilatation or atrophy. No pancreatic calcifications or additional fluid collections.   IMPRESSION: Mildly dilated CBD 10 mm diameter without intrahepatic biliary dilatation.  3.1 x 2.0 x 2.6 cm diameter mass at head/uncinate process of pancreas highly suspicious for pancreatic neoplasm.  EUS 03/20/16 An irregular mass was identified in the pancreatic head. The mass was hypoechoic. The mass measured 39 mm by 32 mm in maximal cross-sectional diameter. The outer margins were irregular. An intact interface was seen between the mass and the celiac trunk and portal vein suggesting a lack of invasion. Fine needle aspiration for cytology was performed. Color Doppler imaging was utilized prior to needle puncture to confirm a lack of significant vascular structures within the needle path. Five passes were made with the 25 gauge needle using a transduodenal approach. A stylet was used. A cytotechnologist was present to evaluate the adequacy of the specimen. The cellularity of the specimen was adequate. Final cytology results are pending.    Problem List/Past Medical Stark Klein, MD; 03/28/2016 9:53 AM) ALCOHOL ABUSE (F10.10) Was drinking 6 pack beer per day prior to hospitalization.  Other Problems Stark Klein, MD; 03/28/2016 9:53 AM) Chest pain Colon Cancer Diabetes Mellitus High blood pressure Inguinal Hernia  Past Surgical History Stark Klein, MD; 03/28/2016 9:06 AM) Knee Surgery Bilateral. Ventral / Umbilical Hernia Surgery Right.  Diagnostic Studies History Stark Klein, MD; 03/28/2016  9:06 AM) Colonoscopy 1-5 years ago  Medication History Stark Klein, MD; [id number] AM) Folic Acid (1MG  Tablet, Oral) Active. Thiamine HCl (100MG  Tablet, Oral) Active. Lantus (100UNIT/ML Solution, Subcutaneous) Active. (22 units per day)  Social History Stark Klein, MD; 03/28/2016 9:06 AM) Alcohol use Recently quit alcohol use. Caffeine use Carbonated beverages. No drug use Tobacco use Former smoker.  Family History Stark Klein, MD; 03/28/2016 9:06 AM) Family history unknown First Degree Relatives    Review of Systems Stark Klein MD; 03/28/2016 9:06 AM) General Present- Appetite Loss. Not Present- Chills, Fatigue, Fever, Night Sweats, Weight Gain and Weight Loss. Skin Not Present- Change in Wart/Mole, Dryness, Hives, Jaundice, New Lesions, Non-Healing Wounds, Rash and Ulcer. HEENT Not Present- Earache, Hearing Loss, Hoarseness, Nose Bleed, Oral Ulcers, Ringing in the Ears, Seasonal Allergies, Sinus Pain, Sore Throat, Visual Disturbances, Wears glasses/contact lenses and Yellow Eyes. Respiratory Not Present- Bloody sputum, Chronic Cough, Difficulty Breathing, Snoring and Wheezing. Breast Not Present- Breast Mass, Breast Pain, Nipple Discharge and Skin Changes. Cardiovascular Present- Chest Pain and Shortness of Breath. Not Present- Difficulty Breathing Lying Down, Leg Cramps, Palpitations, Rapid Heart Rate and Swelling of Extremities. Gastrointestinal Present- Gets full quickly at meals. Not Present- Abdominal Pain, Bloating, Bloody Stool, Change in Bowel Habits, Chronic diarrhea, Constipation, Difficulty Swallowing, Excessive gas, Hemorrhoids, Indigestion, Nausea, Rectal Pain and Vomiting. Male Genitourinary Not Present- Blood in Urine, Change in Urinary Stream, Frequency, Impotence, Nocturia, Painful Urination, Urgency and Urine Leakage. Musculoskeletal Not Present- Back Pain, Joint Pain, Joint Stiffness, Muscle Pain, Muscle Weakness and Swelling of  Extremities. Neurological Not Present- Decreased Memory, Fainting, Headaches, Numbness, Seizures, Tingling, Tremor, Trouble walking and Weakness. Psychiatric Not Present- Anxiety, Bipolar, Change in Sleep Pattern, Depression, Fearful and Frequent crying. Endocrine Not Present- Cold Intolerance, Excessive Hunger, Hair Changes, Heat Intolerance, Hot flashes and New Diabetes. Hematology Not Present- Blood Thinners, Easy Bruising, Excessive bleeding, Gland problems, HIV and Persistent Infections.  Vitals Stark Klein MD; 03/28/2016 9:56 AM) 03/28/2016 9:56 AM Weight: 197.5 lb Height: 70in Body Surface Area: 2.08 m Body Mass Index: 28.34 kg/m  Temp.: 99.55F  Pulse: 92 (Regular)  Resp.: 18 (Unlabored)  P.OX: 100% (Room air) BP: 139/90 (Sitting, Left Arm, Standard)       Assessment & Plan Stark Klein MD; 03/28/2016 10:33 AM) ADENOCARCINOMA OF HEAD OF PANCREAS (C25.0) Impression: Pt appears to have a cT2N0 cancer of the pancreatic head.  This appears resectable.  He will receive a chest CT to make sure no metastatic disease is present. We will place a port for neoadjuvant chemotherapy. I will see him back in approximately 3 months after several cycles of chemotherapy.  Pt given information about whipple and port insertion. Discussed risk of port insertion including malfunction, infection, pneumothorax. Goal for port is within 1-1.5 weeks.   I discussed the potential for Whipple surgery with the patient including diagrams of anatomy. I discussed the potential for diagnostic laparoscopy. In the case of pancreatic cancer, if spread of the disease is found, we will abort the procedure and not proceed with resection. The rationale for this was discussed with the patient. There has not been data to support resection of Stage IV disease in terms of survival benefit.  We discussed possible complications including: Potential of aborting procedure if tumor is invading the superior  mesenteric or hepatic arteries Bleeding Infection and possible wound  complications such as hernia Damage to adjacent structures Leak of anastamoses, primarily pancreatic Possible need for other procedures Possible prolonged nausea with possible need for external feeding. Possible prolonged hospital stay. Possible development of diabetes or worsening of current diabetes. Possible pancreatic exocrine insufficiency Prolonged fatigue/weakness/appetite Possible early recurrence of cancer   The patient understands and wishes to proceed with port placement. He may want to defer surgery. I discussed possibility of SBRT with the patient.  60 min spent in evaluation, examination, counseling, and coordination of care. >50% spent in counseling. Current Plans Pt Education - flb whipple pt info You are being scheduled for surgery - Our schedulers will call you.  You should hear from our office's scheduling department within 5 working days about the location, date, and time of surgery. We try to make accommodations for patient's preferences in scheduling surgery, but sometimes the OR schedule or the surgeon's schedule prevents [place] from making those accommodations.  If you have not heard from our office [phone]) in 5 working days, call the office and ask for your surgeon's nurse.  If you have other questions about your diagnosis, plan, or surgery, call the office and ask for your surgeon's nurse.  CT CHEST W/ AND W/O CONTRAST (71270) (eval for metastatic disease, new panc cancer) Pt Education - ccs port insertion education PROTEIN-CALORIE MALNUTRITION, SEVERE (E43) Impression: Nutrition seeing today.    Signed by Stark Klein, MD (03/28/2016 10:33 AM)

## 2016-04-08 ENCOUNTER — Encounter: Payer: Self-pay | Admitting: Hematology

## 2016-04-08 ENCOUNTER — Encounter (HOSPITAL_COMMUNITY): Payer: Self-pay | Admitting: General Surgery

## 2016-04-08 ENCOUNTER — Ambulatory Visit (HOSPITAL_BASED_OUTPATIENT_CLINIC_OR_DEPARTMENT_OTHER): Payer: BLUE CROSS/BLUE SHIELD

## 2016-04-08 ENCOUNTER — Ambulatory Visit: Payer: BLUE CROSS/BLUE SHIELD

## 2016-04-08 DIAGNOSIS — Z452 Encounter for adjustment and management of vascular access device: Secondary | ICD-10-CM | POA: Diagnosis not present

## 2016-04-08 DIAGNOSIS — C259 Malignant neoplasm of pancreas, unspecified: Secondary | ICD-10-CM | POA: Diagnosis not present

## 2016-04-08 DIAGNOSIS — Z95828 Presence of other vascular implants and grafts: Secondary | ICD-10-CM | POA: Insufficient documentation

## 2016-04-08 MED ORDER — HEPARIN SOD (PORK) LOCK FLUSH 100 UNIT/ML IV SOLN
500.0000 [IU] | Freq: Once | INTRAVENOUS | Status: AC | PRN
  Administered 2016-04-08: 500 [IU] via INTRAVENOUS
  Filled 2016-04-08: qty 5

## 2016-04-08 MED ORDER — SODIUM CHLORIDE 0.9 % IJ SOLN
10.0000 mL | INTRAMUSCULAR | Status: DC | PRN
  Administered 2016-04-08: 10 mL via INTRAVENOUS
  Filled 2016-04-08: qty 10

## 2016-04-08 NOTE — Progress Notes (Signed)
Pt is approved for the $400 [facility] grant.  °

## 2016-04-09 ENCOUNTER — Ambulatory Visit (HOSPITAL_BASED_OUTPATIENT_CLINIC_OR_DEPARTMENT_OTHER): Payer: BLUE CROSS/BLUE SHIELD

## 2016-04-09 ENCOUNTER — Encounter: Payer: Self-pay | Admitting: Hematology

## 2016-04-09 ENCOUNTER — Ambulatory Visit: Payer: BLUE CROSS/BLUE SHIELD | Admitting: Nutrition

## 2016-04-09 VITALS — BP 158/94 | HR 90 | Temp 98.3°F | Resp 18

## 2016-04-09 DIAGNOSIS — Z5111 Encounter for antineoplastic chemotherapy: Secondary | ICD-10-CM

## 2016-04-09 DIAGNOSIS — C259 Malignant neoplasm of pancreas, unspecified: Secondary | ICD-10-CM

## 2016-04-09 LAB — CBC WITH DIFFERENTIAL/PLATELET
BASO%: 0.5 % (ref 0.0–2.0)
BASOS ABS: 0 10*3/uL (ref 0.0–0.1)
EOS%: 2.3 % (ref 0.0–7.0)
Eosinophils Absolute: 0.1 10*3/uL (ref 0.0–0.5)
HEMATOCRIT: 36.7 % — AB (ref 38.4–49.9)
HGB: 11.9 g/dL — ABNORMAL LOW (ref 13.0–17.1)
LYMPH#: 1.2 10*3/uL (ref 0.9–3.3)
LYMPH%: 24 % (ref 14.0–49.0)
MCH: 26.1 pg — AB (ref 27.2–33.4)
MCHC: 32.3 g/dL (ref 32.0–36.0)
MCV: 81 fL (ref 79.3–98.0)
MONO#: 0.5 10*3/uL (ref 0.1–0.9)
MONO%: 8.8 % (ref 0.0–14.0)
NEUT#: 3.3 10*3/uL (ref 1.5–6.5)
NEUT%: 64.4 % (ref 39.0–75.0)
Platelets: 246 10*3/uL (ref 140–400)
RBC: 4.54 10*6/uL (ref 4.20–5.82)
RDW: 13.9 % (ref 11.0–14.6)
WBC: 5.2 10*3/uL (ref 4.0–10.3)

## 2016-04-09 LAB — COMPREHENSIVE METABOLIC PANEL
ALT: 21 U/L (ref 0–55)
AST: 16 U/L (ref 5–34)
Albumin: 3.2 g/dL — ABNORMAL LOW (ref 3.5–5.0)
Alkaline Phosphatase: 151 U/L — ABNORMAL HIGH (ref 40–150)
Anion Gap: 10 mEq/L (ref 3–11)
BUN: 7.1 mg/dL (ref 7.0–26.0)
CALCIUM: 9.3 mg/dL (ref 8.4–10.4)
CHLORIDE: 104 meq/L (ref 98–109)
CO2: 24 meq/L (ref 22–29)
CREATININE: 1 mg/dL (ref 0.7–1.3)
EGFR: >90 mL/min/{1.73_m2} (ref >90)
Glucose: 377 mg/dl — ABNORMAL HIGH (ref 70–140)
POTASSIUM: 4 meq/L (ref 3.5–5.1)
SODIUM: 139 meq/L (ref 136–145)
Total Bilirubin: 0.5 mg/dL (ref 0.20–1.20)
Total Protein: 6.8 g/dL (ref 6.4–8.3)

## 2016-04-09 MED ORDER — PROCHLORPERAZINE MALEATE 10 MG PO TABS
ORAL_TABLET | ORAL | Status: AC
  Filled 2016-04-09: qty 1

## 2016-04-09 MED ORDER — PACLITAXEL PROTEIN-BOUND CHEMO INJECTION 100 MG
125.0000 mg/m2 | Freq: Once | INTRAVENOUS | Status: AC
  Administered 2016-04-09: 275 mg via INTRAVENOUS
  Filled 2016-04-09: qty 55

## 2016-04-09 MED ORDER — HEPARIN SOD (PORK) LOCK FLUSH 100 UNIT/ML IV SOLN
500.0000 [IU] | Freq: Once | INTRAVENOUS | Status: AC | PRN
  Administered 2016-04-09: 500 [IU]
  Filled 2016-04-09: qty 5

## 2016-04-09 MED ORDER — SODIUM CHLORIDE 0.9 % IV SOLN
1000.0000 mg/m2 | Freq: Once | INTRAVENOUS | Status: AC
  Administered 2016-04-09: 2090 mg via INTRAVENOUS
  Filled 2016-04-09: qty 54.97

## 2016-04-09 MED ORDER — PROCHLORPERAZINE MALEATE 10 MG PO TABS
10.0000 mg | ORAL_TABLET | Freq: Once | ORAL | Status: AC
  Administered 2016-04-09: 10 mg via ORAL

## 2016-04-09 MED ORDER — SODIUM CHLORIDE 0.9% FLUSH
10.0000 mL | INTRAVENOUS | Status: DC | PRN
  Administered 2016-04-09: 10 mL
  Filled 2016-04-09: qty 10

## 2016-04-09 MED ORDER — SODIUM CHLORIDE 0.9 % IV SOLN
Freq: Once | INTRAVENOUS | Status: AC
  Administered 2016-04-09: 11:00:00 via INTRAVENOUS

## 2016-04-09 NOTE — Progress Notes (Signed)
Nutrition follow-up completed with patient being treated for pancreas cancer. Weight decreased and was documented as 194 pounds August 28 decreased from 197 pounds August 18. Patient is drinking Glucerna twice a day. He reports 3 soft stools daily. Reports he is taking pancreatic enzymes, 2 with meals.  However, he doesn't take these enzymes until the end of his meal time. Patient denies nausea and vomiting.  Nutrition diagnosis: Unintended weight loss continues.  Intervention: Educated patient to take pancreatic enzymes with the first bite of food to be more effective. Encouraged patient to let us know if this improves stool output. Educated patient that he does need to take enzymes at mealtimes and with snacks in order for optimal coverage. Recommended patient continue Glucerna 2-3 times a day. Provided coupons and samples. Questions answered.  Teach back method used.  Monitoring, evaluation, goals:  Patient will tolerate increased calories and protein to minimize weight loss.  Next visit: Thursday, September 7 during infusion.  **Disclaimer: This note was dictated with voice recognition software. Similar sounding words can inadvertently be transcribed and this note may contain transcription errors which may not have been corrected upon publication of note.**

## 2016-04-09 NOTE — Patient Instructions (Signed)
[facility] Discharge Instructions for Patients Receiving Chemotherapy  Today you received the following chemotherapy agents: Abraxane and Gemzar   To help prevent nausea and vomiting after your treatment, we encourage you to take your nausea medication as directed.    If you develop nausea and vomiting that is not controlled by your nausea medication, call the clinic.   BELOW ARE SYMPTOMS THAT SHOULD BE REPORTED IMMEDIATELY:  *FEVER GREATER THAN 100.5 F  *CHILLS WITH OR WITHOUT FEVER  NAUSEA AND VOMITING THAT IS NOT CONTROLLED WITH YOUR NAUSEA MEDICATION  *UNUSUAL SHORTNESS OF BREATH  *UNUSUAL BRUISING OR BLEEDING  TENDERNESS IN MOUTH AND THROAT WITH OR WITHOUT PRESENCE OF ULCERS  *URINARY PROBLEMS  *BOWEL PROBLEMS  UNUSUAL RASH Items with * indicate a potential emergency and should be followed up as soon as possible.  Feel free to call the clinic you have any questions or concerns. The clinic phone number is [phone].  Please show the CHEMO ALERT CARD at check-in to the Emergency Department and triage nurse.   

## 2016-04-09 NOTE — Progress Notes (Signed)
1st time Abraxane and Gemzar via left port. Pt tolerated well. [name] did not have any future appointments scheduled. He was instructed to stop at scheduling. Pt was stable at time of discharge.

## 2016-04-09 NOTE — Progress Notes (Signed)
Faxed notes/labs to [facility]  [phone]

## 2016-04-16 ENCOUNTER — Telehealth: Payer: Self-pay

## 2016-04-16 NOTE — Telephone Encounter (Signed)
Wife called that pt has a rash on his R side that goes across his back. They cannot see it but can feel it. Pt has an appt with Dr Burr Medico tomorrow 04/17/16. Wife is giving pt cell phone # [phone]. Called pt to clarify: he says rash is on R side and R arm and across his back, it is itchy. No redness, no swelling, no SOB. Told him he can use benadryl if he needs to. Pt believes it is from a change in his insulin that happened 2-3 days ago, not from his chemo he received on 8/31.

## 2016-04-17 ENCOUNTER — Telehealth: Payer: Self-pay

## 2016-04-17 ENCOUNTER — Telehealth: Payer: Self-pay | Admitting: Hematology

## 2016-04-17 ENCOUNTER — Telehealth: Payer: Self-pay | Admitting: *Deleted

## 2016-04-17 ENCOUNTER — Ambulatory Visit (HOSPITAL_BASED_OUTPATIENT_CLINIC_OR_DEPARTMENT_OTHER): Payer: BLUE CROSS/BLUE SHIELD | Admitting: Nurse Practitioner

## 2016-04-17 ENCOUNTER — Ambulatory Visit: Payer: BLUE CROSS/BLUE SHIELD

## 2016-04-17 ENCOUNTER — Other Ambulatory Visit (HOSPITAL_BASED_OUTPATIENT_CLINIC_OR_DEPARTMENT_OTHER): Payer: BLUE CROSS/BLUE SHIELD

## 2016-04-17 ENCOUNTER — Ambulatory Visit: Payer: BLUE CROSS/BLUE SHIELD | Admitting: Nutrition

## 2016-04-17 ENCOUNTER — Ambulatory Visit (HOSPITAL_BASED_OUTPATIENT_CLINIC_OR_DEPARTMENT_OTHER): Payer: BLUE CROSS/BLUE SHIELD

## 2016-04-17 VITALS — BP 151/92 | HR 91 | Temp 98.7°F | Resp 15 | Ht 71.0 in | Wt 189.1 lb

## 2016-04-17 DIAGNOSIS — I10 Essential (primary) hypertension: Secondary | ICD-10-CM

## 2016-04-17 DIAGNOSIS — E1165 Type 2 diabetes mellitus with hyperglycemia: Secondary | ICD-10-CM

## 2016-04-17 DIAGNOSIS — E118 Type 2 diabetes mellitus with unspecified complications: Secondary | ICD-10-CM

## 2016-04-17 DIAGNOSIS — C259 Malignant neoplasm of pancreas, unspecified: Secondary | ICD-10-CM

## 2016-04-17 DIAGNOSIS — C250 Malignant neoplasm of head of pancreas: Secondary | ICD-10-CM | POA: Diagnosis not present

## 2016-04-17 DIAGNOSIS — Z5111 Encounter for antineoplastic chemotherapy: Secondary | ICD-10-CM

## 2016-04-17 LAB — CBC WITH DIFFERENTIAL/PLATELET
BASO%: 0.2 % (ref 0.0–2.0)
BASOS ABS: 0 10*3/uL (ref 0.0–0.1)
EOS ABS: 0.2 10*3/uL (ref 0.0–0.5)
EOS%: 5.2 % (ref 0.0–7.0)
HCT: 36 % — ABNORMAL LOW (ref 38.4–49.9)
HGB: 11.6 g/dL — ABNORMAL LOW (ref 13.0–17.1)
LYMPH%: 28.1 % (ref 14.0–49.0)
MCH: 25.6 pg — AB (ref 27.2–33.4)
MCHC: 32.2 g/dL (ref 32.0–36.0)
MCV: 79.3 fL (ref 79.3–98.0)
MONO#: 0.3 10*3/uL (ref 0.1–0.9)
MONO%: 6.2 % (ref 0.0–14.0)
NEUT#: 2.8 10*3/uL (ref 1.5–6.5)
NEUT%: 60.3 % (ref 39.0–75.0)
PLATELETS: 141 10*3/uL (ref 140–400)
RBC: 4.53 10*6/uL (ref 4.20–5.82)
RDW: 13.5 % (ref 11.0–14.6)
WBC: 4.6 10*3/uL (ref 4.0–10.3)
lymph#: 1.3 10*3/uL (ref 0.9–3.3)

## 2016-04-17 LAB — COMPREHENSIVE METABOLIC PANEL
ALT: 18 U/L (ref 0–55)
ANION GAP: 9 meq/L (ref 3–11)
AST: 14 U/L (ref 5–34)
Albumin: 3.1 g/dL — ABNORMAL LOW (ref 3.5–5.0)
Alkaline Phosphatase: 120 U/L (ref 40–150)
BILIRUBIN TOTAL: 0.43 mg/dL (ref 0.20–1.20)
BUN: 8 mg/dL (ref 7.0–26.0)
CHLORIDE: 101 meq/L (ref 98–109)
CO2: 25 meq/L (ref 22–29)
Calcium: 9.1 mg/dL (ref 8.4–10.4)
Creatinine: 1.3 mg/dL (ref 0.7–1.3)
EGFR: 72 mL/min/{1.73_m2} — AB (ref >90)
Glucose: 504 mg/dl — ABNORMAL HIGH (ref 70–140)
Potassium: 3.7 mEq/L (ref 3.5–5.1)
Sodium: 135 mEq/L — ABNORMAL LOW (ref 136–145)
TOTAL PROTEIN: 6.6 g/dL (ref 6.4–8.3)

## 2016-04-17 LAB — WHOLE BLOOD GLUCOSE: GLUCOSE: 359 mg/dL — AB (ref 70–100)

## 2016-04-17 MED ORDER — INSULIN ASPART 100 UNIT/ML ~~LOC~~ SOLN
10.0000 [IU] | Freq: Once | SUBCUTANEOUS | Status: AC
  Administered 2016-04-17: 10 [IU] via SUBCUTANEOUS
  Filled 2016-04-17: qty 0.1

## 2016-04-17 MED ORDER — PROCHLORPERAZINE MALEATE 10 MG PO TABS
ORAL_TABLET | ORAL | Status: AC
  Filled 2016-04-17: qty 1

## 2016-04-17 MED ORDER — SODIUM CHLORIDE 0.9 % IV SOLN
Freq: Once | INTRAVENOUS | Status: AC
  Administered 2016-04-17: 16:00:00 via INTRAVENOUS

## 2016-04-17 MED ORDER — PACLITAXEL PROTEIN-BOUND CHEMO INJECTION 100 MG
125.0000 mg/m2 | Freq: Once | INTRAVENOUS | Status: AC
  Administered 2016-04-17: 275 mg via INTRAVENOUS
  Filled 2016-04-17: qty 55

## 2016-04-17 MED ORDER — HEPARIN SOD (PORK) LOCK FLUSH 100 UNIT/ML IV SOLN
500.0000 [IU] | Freq: Once | INTRAVENOUS | Status: AC | PRN
  Administered 2016-04-17: 500 [IU]
  Filled 2016-04-17: qty 5

## 2016-04-17 MED ORDER — PROCHLORPERAZINE MALEATE 10 MG PO TABS
10.0000 mg | ORAL_TABLET | Freq: Once | ORAL | Status: AC
  Administered 2016-04-17: 10 mg via ORAL

## 2016-04-17 MED ORDER — SODIUM CHLORIDE 0.9% FLUSH
10.0000 mL | INTRAVENOUS | Status: DC | PRN
  Administered 2016-04-17: 10 mL
  Filled 2016-04-17: qty 10

## 2016-04-17 MED ORDER — SODIUM CHLORIDE 0.9 % IV SOLN
1000.0000 mg/m2 | Freq: Once | INTRAVENOUS | Status: AC
  Administered 2016-04-17: 2090 mg via INTRAVENOUS
  Filled 2016-04-17: qty 54.97

## 2016-04-17 NOTE — Telephone Encounter (Signed)
Per LOS I have scheduled appts. First available given on 9/28. Scheduler notified

## 2016-04-17 NOTE — Telephone Encounter (Signed)
Called. Dr. Adela Ports office, pt glucose [phone] today in office. Spoke with Dr. Mellody Drown who would like pt to receive 10 units of Novalog or Humalog in office today. Pt to continue his lantus at home (patient states this is the one that gave him the rash) Dr. Mellody Drown will see pt tomorrow. Pt scheduled to see Dr. Casper Harrison at 1145. Pt informed

## 2016-04-17 NOTE — Progress Notes (Signed)
[facility] OFFICE PROGRESS NOTE   Diagnosis:  Pancreatic cancer Oncology History   Primary pancreatic adenocarcinoma [facility])   Staging form: Pancreas, AJCC 7th Edition   - Clinical stage from 03/20/2016: Stage IB (T2, N0, M0) - Signed by Truitt Merle, MD on 03/28/2016     Primary pancreatic adenocarcinoma ([place])   03/12/2016 Imaging    CT abdomen and pelvis with contrast showed a 3.1 x 2.0 x 2.6 cm mass at the head of pancreas, preserved fat planes between the pancreatic mass, SMA, and SMV. Question loss of fat plane between the posterior aspect of mass and IVC. Mildly dilated CBD 10 mm.     03/13/2016 Procedure    ERCP and medical stem placement in CBD     03/20/2016 Initial Diagnosis    Primary pancreatic adenocarcinoma ([place])     03/20/2016 Initial Biopsy    FNA of the pancreatic head mass from EUS showed a malignant cells consistent with adenocarcinoma     03/20/2016 Procedure    EUS showed an irregular mass in the pancreatic head, measuring 3.9 x 3.2 cm, no invading of the celiac trunk or portal vein. The mass was biopsied.       INTERVAL HISTORY:   Craig [name] returns as scheduled. He completed cycle 1 day 1 gemcitabine/Abraxane 04/09/2016. He denies nausea/vomiting. No mouth sores. No diarrhea. No fever. No numbness or tingling in his hands or feet. He denies pain.  Mr. Otts reports beginning a new diabetes medication recently. He subsequent developed a rash and discontinued the medication. The rash has improved. He has not been checking his blood sugar at home.  Objective:  Vital signs in last 24 hours:  Blood pressure (!) 151/92, pulse 91, temperature 98.7 F (37.1 C), temperature source Oral, resp. rate 15, height 5\' 11"  (1.803 m), weight 189 lb 1.6 oz (85.8 kg), SpO2 100 %.    HEENT: No thrush or ulcers. Resp: Lungs clear bilaterally. Cardio: Regular rate and rhythm. GI: Abdomen soft and nontender. No hepatomegaly. No  mass. Vascular: No leg edema. Skin: No rash. Port-A-Cath without erythema.    Lab Results:  Lab Results  Component Value Date   WBC 5.2 04/09/2016   HGB 11.9 (L) 04/09/2016   HCT 36.7 (L) 04/09/2016   MCV 81.0 04/09/2016   PLT 246 04/09/2016   NEUTROABS 3.3 04/09/2016    Imaging:  No results found.  Medications: I have reviewed the patient's current medications.  Assessment/Plan: 1. Primary pancreatic adenocarcinoma, in pancreas head, cT2N0M0, stage IB, resectable; initiation of gemcitabine/Abraxane 04/09/2016 (3 weeks on/1 week off). 2. Diabetes mellitus 3. Hypertension 4. Malnutrition. He is being followed by the [facility] dietitian   Disposition: Mr. Ekblad appears stable. Plan to proceed with cycle 1 day 8 gemcitabine/Abraxane today as scheduled. He will return for the day 15 treatment in one week. We will schedule a follow-up visit and cycle 2 gemcitabine/Abraxane on 05/08/2016.  He recently discontinued his diabetes medication after he developed a rash. Blood sugar today is 500. He is not checking his blood sugar at home. He is not sure of the name of the new diabetic medication. We contacted Dr. Adela Ports office for assistance with management of diabetes. Dr. Mellody Drown recommended 10 units of Humalog or NovoLog insulin which we will give in the office. We will repeat the blood sugar in one hour. He will follow-up with Dr. Mellody Drown tomorrow at 1145. We discussed a no concentrated sweet diet. He will increase water intake.  Above plan  reviewed with Dr. Burr Medico. 25 minutes were spent face-to-face at today's visit with the majority of that time involved in counseling/coordination of care.  Ned Card ANP/GNP-BC   04/17/2016  2:13 PM

## 2016-04-17 NOTE — Progress Notes (Signed)
Nutrition follow-up completed with patient receiving chemotherapy for pancreas cancer. Weight was documented as 189 pounds down from 194 pounds August 28. Blood glucose level noted at 504 today.  Patient was given insulin. Patient reports he is eating well, drinking Glucerna twice a day. Continues to have soft stools but they are not watery. Patient reports he takes his pancreatic enzymes with his meals. Denies nausea and vomiting.  Nutrition diagnosis: Unintended weight loss continues.  Intervention: Encouraged patient to take insulin as prescribed by M.D. for better glycemic control. Patient educated to continue strategies for increased calories and protein along with oral nutrition supplements. Patient will continue pancreatic enzymes. Questions were answered.  Teach back method used.  Monitoring, evaluation, goals:  Patient will tolerate increased calories and protein to minimize weight loss.  Next visit: Thursday, September 14 during infusion.  **Disclaimer: This note was dictated with voice recognition software. Similar sounding words can inadvertently be transcribed and this note may contain transcription errors which may not have been corrected upon publication of note.**

## 2016-04-17 NOTE — Telephone Encounter (Signed)
Message sent to chemo scheduler to add chemo. Labs and f/u appts scheduled per 04/17/16 los. Avs report and  appt schd given per  04/17/16 los

## 2016-04-17 NOTE — Progress Notes (Signed)
10 units Novalog insulin given verified by Drucie Ip, RN. Called lab for CBG to be done at 1610

## 2016-04-17 NOTE — Telephone Encounter (Signed)
Message sent to chemo scheduler to add chemo. Labs and f/u schd per 04/17/16 los. avs report and appt schd given per 04/17/16 los.

## 2016-04-17 NOTE — Patient Instructions (Signed)
[facility] Discharge Instructions for Patients Receiving Chemotherapy  Today you received the following chemotherapy agents: Abraxane and Gemzar   To help prevent nausea and vomiting after your treatment, we encourage you to take your nausea medication as directed.    If you develop nausea and vomiting that is not controlled by your nausea medication, call the clinic.   BELOW ARE SYMPTOMS THAT SHOULD BE REPORTED IMMEDIATELY:  *FEVER GREATER THAN 100.5 F  *CHILLS WITH OR WITHOUT FEVER  NAUSEA AND VOMITING THAT IS NOT CONTROLLED WITH YOUR NAUSEA MEDICATION  *UNUSUAL SHORTNESS OF BREATH  *UNUSUAL BRUISING OR BLEEDING  TENDERNESS IN MOUTH AND THROAT WITH OR WITHOUT PRESENCE OF ULCERS  *URINARY PROBLEMS  *BOWEL PROBLEMS  UNUSUAL RASH Items with * indicate a potential emergency and should be followed up as soon as possible.  Feel free to call the clinic you have any questions or concerns. The clinic phone number is [phone].  Please show the CHEMO ALERT CARD at check-in to the Emergency Department and triage nurse.   

## 2016-04-18 LAB — CANCER ANTIGEN 19-9: CA 19-9: 177 U/mL — ABNORMAL HIGH (ref 0–35)

## 2016-04-24 ENCOUNTER — Encounter: Payer: BLUE CROSS/BLUE SHIELD | Admitting: Nutrition

## 2016-04-24 ENCOUNTER — Ambulatory Visit: Payer: BLUE CROSS/BLUE SHIELD

## 2016-04-24 ENCOUNTER — Other Ambulatory Visit (HOSPITAL_BASED_OUTPATIENT_CLINIC_OR_DEPARTMENT_OTHER): Payer: BLUE CROSS/BLUE SHIELD

## 2016-04-24 ENCOUNTER — Encounter: Payer: Self-pay | Admitting: *Deleted

## 2016-04-24 ENCOUNTER — Telehealth: Payer: Self-pay | Admitting: *Deleted

## 2016-04-24 ENCOUNTER — Encounter: Payer: Self-pay | Admitting: Hematology

## 2016-04-24 DIAGNOSIS — C259 Malignant neoplasm of pancreas, unspecified: Secondary | ICD-10-CM

## 2016-04-24 LAB — CBC WITH DIFFERENTIAL/PLATELET
BASO%: 0.5 % (ref 0.0–2.0)
BASOS ABS: 0 10*3/uL (ref 0.0–0.1)
EOS%: 2 % (ref 0.0–7.0)
Eosinophils Absolute: 0 10*3/uL (ref 0.0–0.5)
HCT: 31.9 % — ABNORMAL LOW (ref 38.4–49.9)
HGB: 10.7 g/dL — ABNORMAL LOW (ref 13.0–17.1)
LYMPH#: 1.1 10*3/uL (ref 0.9–3.3)
LYMPH%: 56 % — AB (ref 14.0–49.0)
MCH: 25.8 pg — AB (ref 27.2–33.4)
MCHC: 33.5 g/dL (ref 32.0–36.0)
MCV: 77.1 fL — AB (ref 79.3–98.0)
MONO#: 0.1 10*3/uL (ref 0.1–0.9)
MONO%: 5.5 % (ref 0.0–14.0)
NEUT%: 36 % — AB (ref 39.0–75.0)
NEUTROS ABS: 0.7 10*3/uL — AB (ref 1.5–6.5)
Platelets: 120 10*3/uL — ABNORMAL LOW (ref 140–400)
RBC: 4.14 10*6/uL — AB (ref 4.20–5.82)
RDW: 13.4 % (ref 11.0–14.6)
WBC: 2 10*3/uL — ABNORMAL LOW (ref 4.0–10.3)

## 2016-04-24 LAB — COMPREHENSIVE METABOLIC PANEL
ALT: 20 U/L (ref 0–55)
AST: 17 U/L (ref 5–34)
Albumin: 3.2 g/dL — ABNORMAL LOW (ref 3.5–5.0)
Alkaline Phosphatase: 101 U/L (ref 40–150)
Anion Gap: 8 mEq/L (ref 3–11)
BUN: 7.5 mg/dL (ref 7.0–26.0)
CO2: 26 meq/L (ref 22–29)
Calcium: 9.1 mg/dL (ref 8.4–10.4)
Chloride: 105 mEq/L (ref 98–109)
Creatinine: 0.8 mg/dL (ref 0.7–1.3)
GLUCOSE: 220 mg/dL — AB (ref 70–140)
POTASSIUM: 3.3 meq/L — AB (ref 3.5–5.1)
SODIUM: 139 meq/L (ref 136–145)
Total Bilirubin: 0.32 mg/dL (ref 0.20–1.20)
Total Protein: 6.5 g/dL (ref 6.4–8.3)

## 2016-04-24 NOTE — Progress Notes (Signed)
Patient for Day15 of Abraxane an dGemzar, held to day for neutropenia.  Patient is currently afebrile, VSS, reviewed neutropenia precautions, Handout given and reviewed.  Mask given to patient. As well.  RTC next Thursday.

## 2016-04-24 NOTE — Telephone Encounter (Signed)
Per LOS I have scheduled appts and gave patient calendar 

## 2016-04-24 NOTE — Progress Notes (Signed)
Pt is approved w/ the [employer] for drugs associated w/ his Dx from 04/24/16 to 04/24/17 for $4,500 w/ a 2 month look back period.  Sent copy of approval & payment request info to [place] in billing and sent a copy to HIM to scan in pt's chart.

## 2016-04-24 NOTE — Telephone Encounter (Signed)
Oncology Nurse Navigator Documentation  Oncology Nurse Navigator Flowsheets 04/24/2016  Navigator Location [facility]  Navigator Encounter Type Telephone  Telephone Incoming Call;Symptom Mgt--asking when to wear the masks he was given today?  Abnormal Finding Date -  Confirmed Diagnosis Date -  Patient Visit Type -  Treatment Phase -  Barriers/Navigation Needs Education--wear mask when out in public; wash hands frequently and call for any s/s of infection.  Education -  Interventions -  Coordination of Care -  Education Method -  Support Groups/Services -  Acuity -  Time Spent with Patient 15

## 2016-04-24 NOTE — Patient Instructions (Signed)
Neutropenia Neutropenia is a condition that occurs when the level of a certain type of white blood cell (neutrophil) in your body becomes lower than normal. Neutrophils are made in the bone marrow and fight infections. These cells protect against bacteria and viruses. The fewer neutrophils you have, and the longer your body remains without them, the greater your risk of getting a severe infection becomes. CAUSES  The cause of neutropenia may be hard to determine. However, it is usually due to 3 main problems:   Decreased production of neutrophils. This may be due to:  Certain medicines such as chemotherapy.  Genetic problems.  Cancer.  Radiation treatments.  Vitamin deficiency.  Some pesticides.  Increased destruction of neutrophils. This may be due to:  Overwhelming infections.  Hemolytic anemia. This is when the body destroys its own blood cells.  Chemotherapy.  Neutrophils moving to areas of the body where they cannot fight infections. This may be due to:  Dialysis procedures.  Conditions where the spleen becomes enlarged. Neutrophils are held in the spleen and are not available to the rest of the body.  Overwhelming infections. The neutrophils are held in the area of the infection and are not available to the rest of the body. SYMPTOMS  There are no specific symptoms of neutropenia. The lack of neutrophils can result in an infection, and an infection can cause various problems. DIAGNOSIS  Diagnosis is made by a blood test. A complete blood count is performed. The normal level of neutrophils in human blood differs with age and race. Infants have lower counts than older children and adults. African Americans have lower counts than Caucasians or Asians. The average adult level is 1500 cells/mm3 of blood. Neutrophil counts are interpreted as follows:  Greater than 1000 cells/mm3 gives normal protection against infection.  500 to 1000 cells/mm3 gives an increased risk for  infection.  200 to 500 cells/mm3 is a greater risk for severe infection.  Lower than 200 cells/mm3 is a marked risk of infection. This may require hospitalization and treatment with antibiotic medicines. TREATMENT  Treatment depends on the underlying cause, severity, and presence of infections or symptoms. It also depends on your health. Your caregiver will discuss the treatment plan with you. Mild cases are often easily treated and have a good outcome. Preventative measures may also be started to limit your risk of infections. Treatment can include:  Taking antibiotics.  Stopping medicines that are known to cause neutropenia.  Correcting nutritional deficiencies by eating green vegetables to supply folic acid and taking vitamin B supplements.  Stopping exposure to pesticides if your neutropenia is related to pesticide exposure.  Taking a blood growth factor called sargramostim, pegfilgrastim, or filgrastim if you are undergoing chemotherapy for cancer. This stimulates white blood cell production.  Removal of the spleen if you have Felty's syndrome and have repeated infections. HOME CARE INSTRUCTIONS   Follow your caregiver's instructions about when you need to have blood work done.  Wash your hands often. Make sure others who come in contact with you also wash their hands.  Wash raw fruits and vegetables before eating them. They can carry bacteria and fungi.  Avoid people with colds or spreadable (contagious) diseases (chickenpox, herpes zoster, influenza).  Avoid large crowds.  Avoid construction areas. The dust can release fungus into the air.  Be cautious around children in daycare or school environments.  Take care of your respiratory system by coughing and deep breathing.  Bathe daily.  Protect your skin from cuts and   burns.  Do not work in the garden or with flowers and plants.  Care for the mouth before and after meals by brushing with a soft toothbrush. If you have  mucositis, do not use mouthwash. Mouthwash contains alcohol and can dry out the mouth even more.  Clean the area between the genitals and the anus (perineal area) after urination and bowel movements. Women need to wipe from front to back.  Use a water soluble lubricant during sexual intercourse and practice good hygiene after. Do not have intercourse if you are severely neutropenic. Check with your caregiver for guidelines.  Exercise daily as tolerated.  Avoid people who were vaccinated with a live vaccine in the past 30 days. You should not receive live vaccines (polio, typhoid).  Do not provide direct care for pets. Avoid animal droppings. Do not clean litter boxes and bird cages.  Do not share food utensils.  Do not use tampons, enemas, or rectal suppositories unless directed by your caregiver.  Use an electric razor to remove hair.  Wash your hands after handling magazines, letters, and newspapers. SEEK IMMEDIATE MEDICAL CARE IF:   You have a fever.  You have chills or start to shake.  You feel nauseous or vomit.  You develop mouth sores.  You develop aches and pains.  You have redness and swelling around open wounds.  Your skin is warm to the touch.  You have pus coming from your wounds.  You develop swollen lymph nodes.  You feel weak or fatigued.  You develop red streaks on the skin. MAKE SURE YOU:  Understand these instructions.  Will watch your condition.  Will get help right away if you are not doing well or get worse.   This information is not intended to replace advice given to you by your health care provider. Make sure you discuss any questions you have with your health care provider.   Document Released: 01/17/2002 Document Revised: 10/20/2011 Document Reviewed: 02/07/2015 Elsevier Interactive Patient Education 2016 Elsevier Inc.  

## 2016-04-25 ENCOUNTER — Telehealth: Payer: Self-pay | Admitting: *Deleted

## 2016-04-25 LAB — CANCER ANTIGEN 19-9: CAN 19-9: 94 U/mL — AB (ref 0–35)

## 2016-04-25 MED ORDER — THIAMINE HCL 100 MG PO TABS: 100.0000 mg | ORAL_TABLET | Freq: Every day | ORAL | 0 refills | Status: AC

## 2016-04-25 MED ORDER — FOLIC ACID 1 MG PO TABS: 1.0000 mg | ORAL_TABLET | Freq: Every day | ORAL | 0 refills | Status: DC

## 2016-04-25 MED ORDER — POTASSIUM CHLORIDE CRYS ER 20 MEQ PO TBCR: 20.0000 meq | EXTENDED_RELEASE_TABLET | Freq: Every day | ORAL | 1 refills | Status: AC

## 2016-04-25 NOTE — Telephone Encounter (Signed)
Called pt & spoke with wife & informed that K+ was low & to start K+ & script will be sent to pharmacy.  She asked if he is to continue thiamine & folic acid & if so will need RF.  Dr Burr Medico agreed to RF & will be sent to pharmacy.  She also states that pt needs disability papers signed & asked if Dr Burr Medico had sent anything.  Requested that she have pt's work send [place] a form.  Form received & will give to Ocie Doyne to complete.  She would like to know when it is sent.

## 2016-04-25 NOTE — Telephone Encounter (Signed)
-----   Message from Truitt Merle, MD sent at 04/24/2016 10:59 PM EDT ----- Please let pt know his K 3.3, please call in KCL 26meq daily #30 with 1 refill for him.   Thanks  Truitt Merle

## 2016-05-01 ENCOUNTER — Telehealth: Payer: Self-pay

## 2016-05-01 ENCOUNTER — Ambulatory Visit (HOSPITAL_BASED_OUTPATIENT_CLINIC_OR_DEPARTMENT_OTHER): Payer: BLUE CROSS/BLUE SHIELD

## 2016-05-01 ENCOUNTER — Telehealth: Payer: Self-pay | Admitting: Nurse Practitioner

## 2016-05-01 ENCOUNTER — Ambulatory Visit (HOSPITAL_BASED_OUTPATIENT_CLINIC_OR_DEPARTMENT_OTHER): Payer: BLUE CROSS/BLUE SHIELD | Admitting: Nurse Practitioner

## 2016-05-01 VITALS — BP 148/76 | HR 70

## 2016-05-01 DIAGNOSIS — C250 Malignant neoplasm of head of pancreas: Secondary | ICD-10-CM

## 2016-05-01 DIAGNOSIS — C259 Malignant neoplasm of pancreas, unspecified: Secondary | ICD-10-CM

## 2016-05-01 DIAGNOSIS — Z5111 Encounter for antineoplastic chemotherapy: Secondary | ICD-10-CM | POA: Diagnosis not present

## 2016-05-01 LAB — COMPREHENSIVE METABOLIC PANEL
ALBUMIN: 3 g/dL — AB (ref 3.5–5.0)
ALK PHOS: 92 U/L (ref 40–150)
ALT: 22 U/L (ref 0–55)
AST: 17 U/L (ref 5–34)
Anion Gap: 8 mEq/L (ref 3–11)
BILIRUBIN TOTAL: 0.39 mg/dL (ref 0.20–1.20)
BUN: 6.3 mg/dL — AB (ref 7.0–26.0)
CO2: 25 meq/L (ref 22–29)
CREATININE: 0.8 mg/dL (ref 0.7–1.3)
Calcium: 9 mg/dL (ref 8.4–10.4)
Chloride: 108 mEq/L (ref 98–109)
GLUCOSE: 178 mg/dL — AB (ref 70–140)
Potassium: 4 mEq/L (ref 3.5–5.1)
Sodium: 141 mEq/L (ref 136–145)
TOTAL PROTEIN: 6.1 g/dL — AB (ref 6.4–8.3)

## 2016-05-01 LAB — CBC WITH DIFFERENTIAL/PLATELET
BASO%: 0.2 % (ref 0.0–2.0)
Basophils Absolute: 0 10*3/uL (ref 0.0–0.1)
EOS%: 1.2 % (ref 0.0–7.0)
Eosinophils Absolute: 0.1 10*3/uL (ref 0.0–0.5)
HEMATOCRIT: 33.5 % — AB (ref 38.4–49.9)
HEMOGLOBIN: 11.3 g/dL — AB (ref 13.0–17.1)
LYMPH#: 1.3 10*3/uL (ref 0.9–3.3)
LYMPH%: 30.5 % (ref 14.0–49.0)
MCH: 26.5 pg — ABNORMAL LOW (ref 27.2–33.4)
MCHC: 33.7 g/dL (ref 32.0–36.0)
MCV: 78.6 fL — ABNORMAL LOW (ref 79.3–98.0)
MONO#: 0.6 10*3/uL (ref 0.1–0.9)
MONO%: 14.5 % — ABNORMAL HIGH (ref 0.0–14.0)
NEUT%: 53.6 % (ref 39.0–75.0)
NEUTROS ABS: 2.2 10*3/uL (ref 1.5–6.5)
PLATELETS: 294 10*3/uL (ref 140–400)
RBC: 4.26 10*6/uL (ref 4.20–5.82)
RDW: 14.9 % — AB (ref 11.0–14.6)
WBC: 4.1 10*3/uL (ref 4.0–10.3)

## 2016-05-01 MED ORDER — PROCHLORPERAZINE MALEATE 10 MG PO TABS
10.0000 mg | ORAL_TABLET | Freq: Once | ORAL | Status: AC
  Administered 2016-05-01: 10 mg via ORAL

## 2016-05-01 MED ORDER — SODIUM CHLORIDE 0.9 % IV SOLN
1000.0000 mg/m2 | Freq: Once | INTRAVENOUS | Status: AC
  Administered 2016-05-01: 2090 mg via INTRAVENOUS
  Filled 2016-05-01: qty 54.97

## 2016-05-01 MED ORDER — SODIUM CHLORIDE 0.9 % IV SOLN
Freq: Once | INTRAVENOUS | Status: AC
  Administered 2016-05-01: 16:00:00 via INTRAVENOUS

## 2016-05-01 MED ORDER — PROCHLORPERAZINE MALEATE 10 MG PO TABS
ORAL_TABLET | ORAL | Status: AC
  Filled 2016-05-01: qty 1

## 2016-05-01 MED ORDER — SODIUM CHLORIDE 0.9% FLUSH
10.0000 mL | INTRAVENOUS | Status: DC | PRN
  Administered 2016-05-01: 10 mL
  Filled 2016-05-01: qty 10

## 2016-05-01 MED ORDER — PACLITAXEL PROTEIN-BOUND CHEMO INJECTION 100 MG
125.0000 mg/m2 | Freq: Once | INTRAVENOUS | Status: AC
  Administered 2016-05-01: 275 mg via INTRAVENOUS
  Filled 2016-05-01: qty 55

## 2016-05-01 MED ORDER — HEPARIN SOD (PORK) LOCK FLUSH 100 UNIT/ML IV SOLN
500.0000 [IU] | Freq: Once | INTRAVENOUS | Status: AC | PRN
  Administered 2016-05-01: 500 [IU]
  Filled 2016-05-01: qty 5

## 2016-05-01 NOTE — Progress Notes (Signed)
  [place] OFFICE PROGRESS NOTE    Diagnosis:  Pancreatic cancer     Oncology History   Primary pancreatic adenocarcinoma [facility]) Staging form: Pancreas, AJCC 7th Edition - Clinical stage from 03/20/2016: Stage IB (T2, N0, M0) - Signed by Truitt Merle, MD on 03/28/2016     Primary pancreatic adenocarcinoma ([place])   03/12/2016 Imaging    CT abdomen and pelvis with contrast showed a 3.1 x 2.0 x 2.6 cm mass at the head of pancreas, preserved fat planes between the pancreatic mass, SMA, and SMV. Question loss of fat plane between the posterior aspect of mass and IVC. Mildly dilated CBD 10 mm.     03/13/2016 Procedure    ERCP and medical stem placement in CBD     03/20/2016 Initial Diagnosis    Primary pancreatic adenocarcinoma ([place])     03/20/2016 Initial Biopsy    FNA of the pancreatic head mass from EUS showed a malignant cells consistent with adenocarcinoma     03/20/2016 Procedure    EUS showed an irregular mass in the pancreatic head, measuring 3.9 x 3.2 cm, no invading of the celiac trunk or portal vein. The mass was biopsied.       INTERVAL HISTORY:   Mr. Craig [name] returns as scheduled. He completed cycle 1 day 8 gemcitabine/Abraxane on 04/24/2016. The day 15 treatment was held on 04/24/2016 due to neutropenia. He overall feels well. He denies nausea/vomiting. No mouth sores. No diarrhea. No numbness or tingling in his hands or feet. No rash. No fever. No shortness of breath or cough. He has a good appetite. He reports improved control of blood sugars.  Objective:  Vital signs in last 24 hours:  Blood pressure (!) 180/85, pulse 79, temperature 98.4 F (36.9 C), temperature source Oral, resp. rate 17, height 5\' 11"  (1.803 m), weight 195 lb 14.4 oz (88.9 kg), SpO2 100 %.    HEENT: No thrush or ulcers. Resp: Lungs clear bilaterally. Cardio: Regular rate and rhythm. GI: Abdomen soft and nontender. No hepatomegaly. Vascular: No leg  edema. Port-A-Cath without erythema.   Lab Results:  Lab Results  Component Value Date   WBC 4.1 05/01/2016   HGB 11.3 (L) 05/01/2016   HCT 33.5 (L) 05/01/2016   MCV 78.6 (L) 05/01/2016   PLT 294 05/01/2016   NEUTROABS 2.2 05/01/2016    Imaging:  No results found.  Medications: I have reviewed the patient's current medications.  Assessment/Plan: 1. Primary pancreatic adenocarcinoma, in pancreas head, cT2N0M0, stage IB, resectable; initiation of gemcitabine/Abraxane 04/09/2016 (3 weeks on/1 week off).Treatment schedule adjusted to day 1/day 8 of a 21 day cycle beginning 05/01/2016 2. Diabetes mellitus 3. Hypertension 4. Malnutrition. He is being followed by the [place] dietitian 5. Neutropenia secondary to chemotherapy 04/24/2016; cycle 1 day 15 gemcitabine/Abraxane held. Treatment schedule adjusted as above beginning with cycle 2.   Disposition: Mr. Kludt appears stable. The cycle 1 day 15 treatment of gemcitabine/Abraxane was held due to neutropenia. Blood counts have recovered. Plan to resume treatment today with cycle 2. Dr. Burr Medico recommends adjusting the treatment schedule to day 1 day 8 of a 21 day cycle beginning today.  He will return for a follow-up visit and the day 8 treatment in one week. He will contact the office in the interim with any problems.  Plan reviewed with Dr. Burr Medico.     Ned Card ANP/GNP-BC   05/01/2016  4:00 PM

## 2016-05-01 NOTE — Patient Instructions (Signed)
[facility] Discharge Instructions for Patients Receiving Chemotherapy  Today you received the following chemotherapy agents, Abraxane and Gemzar  To help prevent nausea and vomiting after your treatment, we encourage you to take your nausea medication as directed.   If you develop nausea and vomiting that is not controlled by your nausea medication, call the clinic.   BELOW ARE SYMPTOMS THAT SHOULD BE REPORTED IMMEDIATELY:  *FEVER GREATER THAN 100.5 F  *CHILLS WITH OR WITHOUT FEVER  NAUSEA AND VOMITING THAT IS NOT CONTROLLED WITH YOUR NAUSEA MEDICATION  *UNUSUAL SHORTNESS OF BREATH  *UNUSUAL BRUISING OR BLEEDING  TENDERNESS IN MOUTH AND THROAT WITH OR WITHOUT PRESENCE OF ULCERS  *URINARY PROBLEMS  *BOWEL PROBLEMS  UNUSUAL RASH Items with * indicate a potential emergency and should be followed up as soon as possible.  Feel free to call the clinic you have any questions or concerns. The clinic phone number is [phone].  Please show the [place] at check-in to the Emergency Department and triage nurse.

## 2016-05-01 NOTE — Telephone Encounter (Signed)
Message sent to infusion scheduler to be added. Lab appt schd. Avs report and appointment schedule given to patient, per 05/01/16 los.

## 2016-05-01 NOTE — Telephone Encounter (Signed)
Faxed disability paperwork to -[employer] health benefit fund on 05/01/16.  Pt notified fax had been sent

## 2016-05-01 NOTE — Patient Instructions (Signed)
[facility] Discharge Instructions for Patients Receiving Chemotherapy  Today you received the following chemotherapy agents: Abraxane and Gemzar   To help prevent nausea and vomiting after your treatment, we encourage you to take your nausea medication as directed.    If you develop nausea and vomiting that is not controlled by your nausea medication, call the clinic.   BELOW ARE SYMPTOMS THAT SHOULD BE REPORTED IMMEDIATELY:  *FEVER GREATER THAN 100.5 F  *CHILLS WITH OR WITHOUT FEVER  NAUSEA AND VOMITING THAT IS NOT CONTROLLED WITH YOUR NAUSEA MEDICATION  *UNUSUAL SHORTNESS OF BREATH  *UNUSUAL BRUISING OR BLEEDING  TENDERNESS IN MOUTH AND THROAT WITH OR WITHOUT PRESENCE OF ULCERS  *URINARY PROBLEMS  *BOWEL PROBLEMS  UNUSUAL RASH Items with * indicate a potential emergency and should be followed up as soon as possible.  Feel free to call the clinic you have any questions or concerns. The clinic phone number is [phone].  Please show the CHEMO ALERT CARD at check-in to the Emergency Department and triage nurse.   

## 2016-05-02 ENCOUNTER — Ambulatory Visit: Payer: BLUE CROSS/BLUE SHIELD | Admitting: Hematology

## 2016-05-06 ENCOUNTER — Telehealth: Payer: Self-pay

## 2016-05-06 NOTE — Telephone Encounter (Signed)
Faxed disability paperwork to [employer]

## 2016-05-08 ENCOUNTER — Encounter: Payer: Self-pay | Admitting: Hematology

## 2016-05-08 ENCOUNTER — Ambulatory Visit (HOSPITAL_BASED_OUTPATIENT_CLINIC_OR_DEPARTMENT_OTHER): Payer: BLUE CROSS/BLUE SHIELD | Admitting: Hematology

## 2016-05-08 ENCOUNTER — Telehealth: Payer: Self-pay | Admitting: Hematology

## 2016-05-08 ENCOUNTER — Ambulatory Visit (HOSPITAL_BASED_OUTPATIENT_CLINIC_OR_DEPARTMENT_OTHER): Payer: BLUE CROSS/BLUE SHIELD

## 2016-05-08 ENCOUNTER — Other Ambulatory Visit (HOSPITAL_BASED_OUTPATIENT_CLINIC_OR_DEPARTMENT_OTHER): Payer: BLUE CROSS/BLUE SHIELD

## 2016-05-08 VITALS — BP 146/75 | HR 96 | Temp 98.2°F | Resp 18 | Ht 71.0 in | Wt 187.3 lb

## 2016-05-08 DIAGNOSIS — C250 Malignant neoplasm of head of pancreas: Secondary | ICD-10-CM | POA: Diagnosis not present

## 2016-05-08 DIAGNOSIS — E118 Type 2 diabetes mellitus with unspecified complications: Secondary | ICD-10-CM | POA: Diagnosis not present

## 2016-05-08 DIAGNOSIS — C259 Malignant neoplasm of pancreas, unspecified: Secondary | ICD-10-CM

## 2016-05-08 DIAGNOSIS — E46 Unspecified protein-calorie malnutrition: Secondary | ICD-10-CM | POA: Diagnosis not present

## 2016-05-08 DIAGNOSIS — I10 Essential (primary) hypertension: Secondary | ICD-10-CM | POA: Diagnosis not present

## 2016-05-08 DIAGNOSIS — Z5111 Encounter for antineoplastic chemotherapy: Secondary | ICD-10-CM | POA: Diagnosis not present

## 2016-05-08 LAB — COMPREHENSIVE METABOLIC PANEL
ALK PHOS: 97 U/L (ref 40–150)
ALT: 24 U/L (ref 0–55)
ANION GAP: 10 meq/L (ref 3–11)
AST: 13 U/L (ref 5–34)
Albumin: 3.2 g/dL — ABNORMAL LOW (ref 3.5–5.0)
BILIRUBIN TOTAL: 0.35 mg/dL (ref 0.20–1.20)
BUN: 8.1 mg/dL (ref 7.0–26.0)
CO2: 23 meq/L (ref 22–29)
Calcium: 9.2 mg/dL (ref 8.4–10.4)
Chloride: 106 mEq/L (ref 98–109)
Creatinine: 1 mg/dL (ref 0.7–1.3)
Glucose: 210 mg/dl — ABNORMAL HIGH (ref 70–140)
Potassium: 3.4 mEq/L — ABNORMAL LOW (ref 3.5–5.1)
Sodium: 140 mEq/L (ref 136–145)
TOTAL PROTEIN: 6.5 g/dL (ref 6.4–8.3)

## 2016-05-08 LAB — CBC WITH DIFFERENTIAL/PLATELET
BASO%: 1.1 % (ref 0.0–2.0)
Basophils Absolute: 0 10*3/uL (ref 0.0–0.1)
EOS%: 1.4 % (ref 0.0–7.0)
Eosinophils Absolute: 0 10*3/uL (ref 0.0–0.5)
HCT: 35.2 % — ABNORMAL LOW (ref 38.4–49.9)
HGB: 11.1 g/dL — ABNORMAL LOW (ref 13.0–17.1)
LYMPH%: 50.3 % — AB (ref 14.0–49.0)
MCH: 25.3 pg — ABNORMAL LOW (ref 27.2–33.4)
MCHC: 31.6 g/dL — AB (ref 32.0–36.0)
MCV: 80.2 fL (ref 79.3–98.0)
MONO#: 0.2 10*3/uL (ref 0.1–0.9)
MONO%: 8.1 % (ref 0.0–14.0)
NEUT%: 39.1 % (ref 39.0–75.0)
NEUTROS ABS: 0.8 10*3/uL — AB (ref 1.5–6.5)
PLATELETS: 318 10*3/uL (ref 140–400)
RBC: 4.39 10*6/uL (ref 4.20–5.82)
RDW: 14.9 % — ABNORMAL HIGH (ref 11.0–14.6)
WBC: 2.2 10*3/uL — AB (ref 4.0–10.3)
lymph#: 1.1 10*3/uL (ref 0.9–3.3)

## 2016-05-08 MED ORDER — SODIUM CHLORIDE 0.9% FLUSH
10.0000 mL | INTRAVENOUS | Status: DC | PRN
  Administered 2016-05-08: 10 mL
  Filled 2016-05-08: qty 10

## 2016-05-08 MED ORDER — PACLITAXEL PROTEIN-BOUND CHEMO INJECTION 100 MG
125.0000 mg/m2 | Freq: Once | INTRAVENOUS | Status: AC
  Administered 2016-05-08: 275 mg via INTRAVENOUS
  Filled 2016-05-08: qty 55

## 2016-05-08 MED ORDER — SODIUM CHLORIDE 0.9 % IV SOLN
1000.0000 mg/m2 | Freq: Once | INTRAVENOUS | Status: AC
  Administered 2016-05-08: 2090 mg via INTRAVENOUS
  Filled 2016-05-08: qty 54.97

## 2016-05-08 MED ORDER — HEPARIN SOD (PORK) LOCK FLUSH 100 UNIT/ML IV SOLN
500.0000 [IU] | Freq: Once | INTRAVENOUS | Status: AC | PRN
  Administered 2016-05-08: 500 [IU]
  Filled 2016-05-08: qty 5

## 2016-05-08 MED ORDER — PROCHLORPERAZINE MALEATE 10 MG PO TABS
ORAL_TABLET | ORAL | Status: AC
Start: 2016-05-08 — End: 2016-05-08
  Filled 2016-05-08: qty 1

## 2016-05-08 MED ORDER — SODIUM CHLORIDE 0.9 % IV SOLN
Freq: Once | INTRAVENOUS | Status: AC
  Administered 2016-05-08: 11:00:00 via INTRAVENOUS

## 2016-05-08 MED ORDER — PROCHLORPERAZINE MALEATE 10 MG PO TABS
10.0000 mg | ORAL_TABLET | Freq: Once | ORAL | Status: AC
  Administered 2016-05-08: 10 mg via ORAL

## 2016-05-08 NOTE — Telephone Encounter (Signed)
Avs report and appointment schedule given to patient, per 05/08/16 los. °

## 2016-05-08 NOTE — Patient Instructions (Signed)
[facility] Discharge Instructions for Patients Receiving Chemotherapy  Today you received the following chemotherapy agents:  Abraxane, Gemzar.   To help prevent nausea and vomiting after your treatment, we encourage you to take your nausea medication.   If you develop nausea and vomiting that is not controlled by your nausea medication, call the clinic.   BELOW ARE SYMPTOMS THAT SHOULD BE REPORTED IMMEDIATELY:  *FEVER GREATER THAN 100.5 F  *CHILLS WITH OR WITHOUT FEVER  NAUSEA AND VOMITING THAT IS NOT CONTROLLED WITH YOUR NAUSEA MEDICATION  *UNUSUAL SHORTNESS OF BREATH  *UNUSUAL BRUISING OR BLEEDING  TENDERNESS IN MOUTH AND THROAT WITH OR WITHOUT PRESENCE OF ULCERS  *URINARY PROBLEMS  *BOWEL PROBLEMS  UNUSUAL RASH Items with * indicate a potential emergency and should be followed up as soon as possible.  Feel free to call the clinic you have any questions or concerns. The clinic phone number is [phone].  Please show the CHEMO ALERT CARD at check-in to the Emergency Department and triage nurse.   

## 2016-05-08 NOTE — Progress Notes (Signed)
[place]  [phone]) [phone] [phone]) [phone]  Clinic Follow Up Note   Patient Care Team: Jilda Panda, MD as PCP - General (Internal Medicine) 05/08/2016  CHIEF COMPLAINTS:  Follow up pancreatic cancer   Oncology History   Primary pancreatic adenocarcinoma [facility])   Staging form: Pancreas, AJCC 7th Edition   - Clinical stage from 03/20/2016: Stage IB (T2, N0, M0) - Signed by Truitt Merle, MD on 03/28/2016      Primary pancreatic adenocarcinoma ([place])   03/12/2016 Imaging    CT abdomen and pelvis with contrast showed a 3.1 x 2.0 x 2.6 cm mass at the head of pancreas, preserved fat planes between the pancreatic mass, SMA, and SMV. Question loss of fat plane between the posterior aspect of mass and IVC. Mildly dilated CBD 10 mm.      03/13/2016 Procedure    ERCP and medical stem placement in CBD      03/20/2016 Initial Diagnosis    Primary pancreatic adenocarcinoma ([place])      03/20/2016 Initial Biopsy    FNA of the pancreatic head mass from EUS showed a malignant cells consistent with adenocarcinoma      03/20/2016 Procedure    EUS showed an irregular mass in the pancreatic head, measuring 3.9 x 3.2 cm, no invading of the celiac trunk or portal vein. The mass was biopsied.       04/09/2016 -  Neo-Adjuvant Chemotherapy    Gemcitabine and Abraxane on Day 1, 8 every 21 days         HISTORY OF PRESENTING ILLNESS:  Craig [name] 60 y.o. male is here because of His newly diagnosed pancreatic cancer. He is coming up by his wife to our multidisciplinary check clinic today.  He has had epigastric pain for the last 4-6 weeks. He describes the pain is intermittent, last a few hours and a few times a day, located in the low mid chest and upper abdomen, not related to eating or position. He denies nausea, cough, or dyspnea. His appetite has decreased lately, and he complains about moderate fatigue, he was not able to tolerate his physically demanding job, and he has  been out of week for 3 weeks. He lost about 30-40 lbs in the past 2 months.   He was seen by his primary care physician a few times, and he developed jaundice, and abnormal liver function. He was sent to Hospital by his primary care physician. During his hospital stay, CT scan reviewed a pancreatic head mass, and mild dilatation of bile duct. He was seen by GI Dr. Jacelyn Grip, underwent ERCP and metal stent placement in CBD. He subsequently underwent EUS which showed an irregular mass in the pancreatic head, measuring 3.9 cm, no invading of the celiac trunk or portal vein. The mass was biopsied, which showed adenocarcinoma.  He is married, lives with his wife. His abdominal pain, fatigue has not changed much since the CBD stent placement. He is able to tolerate routine activities, no other new complaints. He has been drinking beer moderately for the past 40 years, but stopped a few weeks ago.   CURRENT THERAPY: neoadjuvant chemotherapy gemcitabine and Abraxane ON d1, 8 every 21 days   INTERIM HISTORY: Mr. Butorac returns for follow-up and cycle 2 day 8 chemotherapy. He is accompanied by his brother to the clinic today. He has been tolerating chemotherapy well overall. He does have taste change from chemotherapy, with decreased appetite and mild fatigue. He did lose weight after each chemotherapy,  but he was able to gain back during the week off of chemotherapy. He is off work now, does some light housework at home, but not physically active. He denies any other nausea, diarrhea, fever, or other symptoms.   MEDICAL HISTORY:  Past Medical History:  Diagnosis Date  . Chronic kidney disease   . Diabetes mellitus without complication ([place])   . ETOH abuse   . Hypertension   . Primary pancreatic adenocarcinoma ([place]) 03/28/2016  . Type 2 diabetes mellitus ([place])     SURGICAL HISTORY: Past Surgical History:  Procedure Laterality Date  . ERCP Left 03/13/2016   Procedure: ENDOSCOPIC RETROGRADE  CHOLANGIOPANCREATOGRAPHY (ERCP);  Surgeon: Carol Ada, MD;  Location: [facility] ENDOSCOPY;  Service: Endoscopy;  Laterality: Left;  . EUS N/A 03/20/2016   Procedure: UPPER ENDOSCOPIC ULTRASOUND (EUS) LINEAR;  Surgeon: Carol Ada, MD;  Location: [facility] ENDOSCOPY;  Service: Endoscopy;  Laterality: N/A;  . HERNIA REPAIR    . KNEE ARTHROSCOPY     left and right  . PORTACATH PLACEMENT N/A 04/07/2016   Procedure: INSERTION PORT-A-CATH;  Surgeon: Stark Klein, MD;  Location: [place];  Service: General;  Laterality: N/A;    SOCIAL HISTORY: Social History   Social History  . Marital status: Married    Spouse name: N/A  . Number of children: N/A  . Years of education: N/A   Occupational History  . Not on file.   Social History Main Topics  . Smoking status: Former Smoker    Packs/day: 1.00    Years: 15.00    Quit date: 08/12/1995  . Smokeless tobacco: Never Used  . Alcohol use Yes     Comment: he used to drink 6 pack beer a day for 40 years, quit in 02/2016   . Drug use: No  . Sexual activity: Not on file   Other Topics Concern  . Not on file   Social History Narrative  . No narrative on file    FAMILY HISTORY: Family History  Problem Relation Age of Onset  . Heart attack Mother     ALLERGIES:  is allergic to no known allergies.  MEDICATIONS:  Current Outpatient Prescriptions  Medication Sig Dispense Refill  . folic acid (FOLVITE) 1 MG tablet Take 1 tablet (1 mg total) by mouth daily. 30 tablet 0  . Insulin Glargine (LANTUS SOLOSTAR) 100 UNIT/ML Solostar Pen Inject 22 Units into the skin every morning.    . lidocaine-prilocaine (EMLA) cream Apply to affected area once 30 g 3  . naproxen sodium (ALEVE) 220 MG tablet Take 440 mg by mouth daily.    . ondansetron (ZOFRAN) 8 MG tablet Take 1 tablet (8 mg total) by mouth every 8 (eight) hours as needed (Nausea or vomiting). 30 tablet 1  . oxyCODONE (OXY IR/ROXICODONE) 5 MG immediate release tablet Take 1-2 tablets (5-10 mg total) by mouth  every 6 (six) hours as needed for moderate pain, severe pain or breakthrough pain. 30 tablet 0  . Pancrelipase, Lip-Prot-Amyl, (ZENPEP PO) Take 2 capsules by mouth 3 (three) times daily.    . potassium chloride SA (K-DUR,KLOR-CON) 20 MEQ tablet Take 1 tablet (20 mEq total) by mouth daily. 30 tablet 1  . prochlorperazine (COMPAZINE) 10 MG tablet Take 1 tablet (10 mg total) by mouth every 8 (eight) hours as needed (Nausea or vomiting). 30 tablet 1  . thiamine 100 MG tablet Take 1 tablet (100 mg total) by mouth daily. 30 tablet 0   No current facility-administered medications for this visit.  REVIEW OF SYSTEMS:   Constitutional: Denies fevers, chills or abnormal night sweats Eyes: Denies blurriness of vision, double vision or watery eyes Ears, nose, mouth, throat, and face: Denies mucositis or sore throat Respiratory: Denies cough, dyspnea or wheezes Cardiovascular: Denies palpitation, chest discomfort or lower extremity swelling Gastrointestinal:  Denies nausea, heartburn or change in bowel habits Skin: Denies abnormal skin rashes Lymphatics: Denies new lymphadenopathy or easy bruising Neurological:Denies numbness, tingling or new weaknesses Behavioral/Psych: Mood is stable, no new changes  All other systems were reviewed with the patient and are negative.  PHYSICAL EXAMINATION: ECOG PERFORMANCE STATUS: 1 - Symptomatic but completely ambulatory  Vitals:   05/08/16 0958  BP: (!) 146/75  Pulse: 96  Resp: 18  Temp: 98.2 F (36.8 C)   Filed Weights   05/08/16 0958  Weight: 187 lb 4.8 oz (85 kg)    GENERAL:alert, no distress and comfortable SKIN: skin color, texture, turgor are normal, no rashes or significant lesions EYES: normal, conjunctiva are pink and non-injected, sclera clear OROPHARYNX:no exudate, no erythema and lips, buccal mucosa, and tongue normal  NECK: supple, thyroid normal size, non-tender, without nodularity LYMPH:  no palpable lymphadenopathy in the cervical,  axillary or inguinal LUNGS: clear to auscultation and percussion with normal breathing effort HEART: regular rate & rhythm and no murmurs and no lower extremity edema ABDOMEN:abdomen soft, non-tender and normal bowel sounds Musculoskeletal:no cyanosis of digits and no clubbing  PSYCH: alert & oriented x 3 with fluent speech NEURO: no focal motor/sensory deficits  LABORATORY DATA:  I have reviewed the data as listed CBC Latest Ref Rng & Units 05/08/2016 05/01/2016 04/24/2016  WBC 4.0 - 10.3 10e3/uL 2.2(L) 4.1 2.0(L)  Hemoglobin 13.0 - 17.1 g/dL 11.1(L) 11.3(L) 10.7(L)  Hematocrit 38.4 - 49.9 % 35.2(L) 33.5(L) 31.9(L)  Platelets 140 - 400 10e3/uL 318 294 120(L)   CMP Latest Ref Rng & Units 05/08/2016 05/01/2016 04/24/2016  Glucose 70 - 140 mg/dl 210(H) 178(H) 220(H)  BUN 7.0 - 26.0 mg/dL 8.1 6.3(L) 7.5  Creatinine 0.7 - 1.3 mg/dL 1.0 0.8 0.8  Sodium 136 - 145 mEq/L 140 141 139  Potassium 3.5 - 5.1 mEq/L 3.4(L) 4.0 3.3(L)  Chloride 101 - 111 mmol/L - - -  CO2 22 - 29 mEq/L 23 25 26   Calcium 8.4 - 10.4 mg/dL 9.2 9.0 9.1  Total Protein 6.4 - 8.3 g/dL 6.5 6.1(L) 6.5  Total Bilirubin 0.20 - 1.20 mg/dL 0.35 0.39 0.32  Alkaline Phos 40 - 150 U/L 97 92 101  AST 5 - 34 U/L 13 17 17   ALT 0 - 55 U/L 24 22 20    PATHOLOGY REPORT  Diagnosis 03/20/2016 FINE NEEDLE ASPIRATION, ENDOSCOPIC, PANCREAS HEAD (SPECIMEN 1 OF 1 COLLECTED 03/20/16): MALIGNANT CELLS CONSISTENT WITH ADENOCARCINOMA. Preliminary Diagnosis Intraoperative Diagnosis: Adequate. ([facility])  Diagnosis 03/13/2016 Duodenum, Biopsy, Duodenal mass - BENIGN ULCERATED AND INFLAMED SMALL BOWEL-TYPE MUCOSA. - THERE IS NO EVIDENCE OF MALIGNANCY. - SEE COMMENT. Microscopic Comment Multiple tissue levels have been examined. Dr. Claudette Laws has reviewed the case and concurs with this interpretation. Dr. Benson Norway was paged on 03/14/2016. (JBK:kh 03/14/16)  RADIOGRAPHIC STUDIES: I have personally reviewed the radiological images as listed and agreed with the  findings in the report. No results found. EUS Dr. Benson Norway  Endosonographic Finding 03/20/2016 Findings: An irregular mass was identified in the pancreatic head. The mass was hypoechoic. The mass measured 39 mm by 32 mm in maximal cross-sectional diameter. The outer margins were irregular. An intact interface was seen between the mass and  the celiac trunk and portal vein suggesting a lack of invasion. Fine needle aspiration for cytology was performed. Color Doppler imaging was utilized prior to needle puncture to confirm a lack of significant vascular structures within the needle path. Five passes were made with the 25 gauge needle using a transduodenal approach. A stylet was used. A cytotechnologist was present to evaluate the adequacy of the specimen. The cellularity of the specimen was adequate. Final cytology results are pending. - A mass was identified in the pancreatic head. Tissue was obtained from this exam, and results are pending. However, the endosonographic appearance is highly suspicious for adenocarcinoma. Fine needle aspiration performed.  ERCP 03/13/2016 Impression: - One covered metal stent was placed into the common bile duct.   ASSESSMENT & PLAN: 60 year old African-American male, with past medical history of diabetes and hypertension, presented with epigastric pain, weight loss, and jaundice.  1. Primary pancreatic adenocarcinoma, in pancreas head, cT2N0M0, stage IB, resectable  -I have reviewed his CT abdomen and pelvis with contrast, EUS and biopsy findings with patient and his wife in great details. -His case was reviewed in our GI tumor board a few days ago. Post CT scan and US showed no evidence of vascular invasion from the pancreatic tumor, this is resectable disease. -CT chest was negative for metastatic disease. I reviewed the results with patient. -We reviewed the nature history of pancreatic cancer, and the overall survival rate with chemotherapy, surgery, and  radiation. Patient and his wife was discouraged by the overall dismal long term survival rate (~20%)  -He was seen by surgeon Dr. Barry Dienes, Whipple surgery was discussed and offered to patient. Patient is little reluctant to have the high risk Whipple surgery, will think about it.  -I reviewed the high risk of cancer recurrence after surgery, and the role of neoadjuvant and adjuvant chemotherapy to reduce the risk of recurrence, and shrink the tumor before surgery. -I discussed with Dr. Barry Dienes, we recommend patient to have new adjuvant chemotherapy first. -Pt declined clinical trial SWOG S1505. -He is a new adjuvant chemotherapy with gemcitabine and Abraxane, tolerating well overall. -Lab review, mild neutropenia, adequate for treatment, we'll proceed day 15 treatment today -Plan to repeat CT scans after 3-4 cycle chemotherapy.  2. HTN and DM - he will continue medication and follow up with his primary care physician  -We reviewed her that his blood pressure and blood glucose will need to be monitored closely during the chemotherapy, and his medication may need to be adjusted  -His hypoglycemia has been better controlled lately. I again discussed diabetic diet, and I strongly encouraged him to exercise  3. Malnutrition -He has lost significant amount weight, albumin is low, he was seen by our dietitian Pamala Hurry and will follow up  -We'll watch his weight and nutrition status closely when he is on chemotherapy -He takes Glucerna 1-2 bottle today  Plan -Lab review, ANC 0.8, adequate for treatment, we'll proceed day 15 chemotherapy today -Return to clinic in 2 weeks for cycle 3 chemotherapy -Patient declined flu shot   All questions were answered. The patient knows to call the clinic with any problems, questions or concerns. I spent 25 minutes counseling the patient face to face. The total time spent in the appointment was 30 minutes and more than 50% was on counseling.     Truitt Merle,  MD 05/08/2016

## 2016-05-15 ENCOUNTER — Other Ambulatory Visit: Payer: BLUE CROSS/BLUE SHIELD

## 2016-05-15 ENCOUNTER — Ambulatory Visit: Payer: BLUE CROSS/BLUE SHIELD

## 2016-05-15 ENCOUNTER — Encounter: Payer: BLUE CROSS/BLUE SHIELD | Admitting: Nutrition

## 2016-05-16 ENCOUNTER — Telehealth: Payer: Self-pay

## 2016-05-16 NOTE — Telephone Encounter (Signed)
Vinnie Level with [employer] called. She was asking when pt last visit and next visit were. Dates given. She asked if there is a projected return to work date. None noted in Dr Ernestina Penna note. Vinnie Level thanked [place] for the information.

## 2016-05-22 ENCOUNTER — Other Ambulatory Visit: Payer: Self-pay | Admitting: *Deleted

## 2016-05-22 ENCOUNTER — Ambulatory Visit: Payer: BLUE CROSS/BLUE SHIELD | Admitting: Nutrition

## 2016-05-22 ENCOUNTER — Other Ambulatory Visit (HOSPITAL_BASED_OUTPATIENT_CLINIC_OR_DEPARTMENT_OTHER): Payer: BLUE CROSS/BLUE SHIELD

## 2016-05-22 ENCOUNTER — Ambulatory Visit (HOSPITAL_BASED_OUTPATIENT_CLINIC_OR_DEPARTMENT_OTHER): Payer: BLUE CROSS/BLUE SHIELD

## 2016-05-22 ENCOUNTER — Telehealth: Payer: Self-pay

## 2016-05-22 ENCOUNTER — Telehealth: Payer: Self-pay | Admitting: Hematology

## 2016-05-22 ENCOUNTER — Ambulatory Visit: Payer: BLUE CROSS/BLUE SHIELD

## 2016-05-22 ENCOUNTER — Ambulatory Visit (HOSPITAL_BASED_OUTPATIENT_CLINIC_OR_DEPARTMENT_OTHER): Payer: BLUE CROSS/BLUE SHIELD | Admitting: Nurse Practitioner

## 2016-05-22 VITALS — BP 153/70 | HR 95 | Temp 98.1°F | Resp 18 | Ht 71.0 in | Wt 183.4 lb

## 2016-05-22 DIAGNOSIS — E118 Type 2 diabetes mellitus with unspecified complications: Secondary | ICD-10-CM | POA: Diagnosis not present

## 2016-05-22 DIAGNOSIS — C259 Malignant neoplasm of pancreas, unspecified: Secondary | ICD-10-CM

## 2016-05-22 DIAGNOSIS — C250 Malignant neoplasm of head of pancreas: Secondary | ICD-10-CM

## 2016-05-22 DIAGNOSIS — Z5111 Encounter for antineoplastic chemotherapy: Secondary | ICD-10-CM | POA: Diagnosis not present

## 2016-05-22 DIAGNOSIS — E46 Unspecified protein-calorie malnutrition: Secondary | ICD-10-CM

## 2016-05-22 LAB — CBC WITH DIFFERENTIAL/PLATELET
BASO%: 0.2 % (ref 0.0–2.0)
BASOS ABS: 0 10*3/uL (ref 0.0–0.1)
EOS ABS: 0.1 10*3/uL (ref 0.0–0.5)
EOS%: 1.7 % (ref 0.0–7.0)
HEMATOCRIT: 31.8 % — AB (ref 38.4–49.9)
HEMOGLOBIN: 10.6 g/dL — AB (ref 13.0–17.1)
LYMPH#: 1.2 10*3/uL (ref 0.9–3.3)
LYMPH%: 22.5 % (ref 14.0–49.0)
MCH: 25.9 pg — AB (ref 27.2–33.4)
MCHC: 33.3 g/dL (ref 32.0–36.0)
MCV: 77.6 fL — AB (ref 79.3–98.0)
MONO#: 1 10*3/uL — AB (ref 0.1–0.9)
MONO%: 18.3 % — ABNORMAL HIGH (ref 0.0–14.0)
NEUT%: 57.3 % (ref 39.0–75.0)
NEUTROS ABS: 3.1 10*3/uL (ref 1.5–6.5)
PLATELETS: 299 10*3/uL (ref 140–400)
RBC: 4.1 10*6/uL — ABNORMAL LOW (ref 4.20–5.82)
RDW: 14.8 % — AB (ref 11.0–14.6)
WBC: 5.4 10*3/uL (ref 4.0–10.3)

## 2016-05-22 LAB — COMPREHENSIVE METABOLIC PANEL
ALBUMIN: 3.1 g/dL — AB (ref 3.5–5.0)
ALK PHOS: 138 U/L (ref 40–150)
ALT: 34 U/L (ref 0–55)
ANION GAP: 10 meq/L (ref 3–11)
AST: 24 U/L (ref 5–34)
BILIRUBIN TOTAL: 0.35 mg/dL (ref 0.20–1.20)
BUN: 7.1 mg/dL (ref 7.0–26.0)
CALCIUM: 8.9 mg/dL (ref 8.4–10.4)
CO2: 25 mEq/L (ref 22–29)
Chloride: 103 mEq/L (ref 98–109)
Creatinine: 0.9 mg/dL (ref 0.7–1.3)
GLUCOSE: 347 mg/dL — AB (ref 70–140)
POTASSIUM: 3.4 meq/L — AB (ref 3.5–5.1)
SODIUM: 138 meq/L (ref 136–145)
TOTAL PROTEIN: 6.4 g/dL (ref 6.4–8.3)

## 2016-05-22 LAB — WHOLE BLOOD GLUCOSE
GLUCOSE: 246 mg/dL — AB (ref 70–100)
HRS PC: 1 h

## 2016-05-22 MED ORDER — SODIUM CHLORIDE 0.9 % IV SOLN
Freq: Once | INTRAVENOUS | Status: AC
  Administered 2016-05-22: 12:00:00 via INTRAVENOUS

## 2016-05-22 MED ORDER — SODIUM CHLORIDE 0.9 % IV SOLN
1000.0000 mg/m2 | Freq: Once | INTRAVENOUS | Status: AC
  Administered 2016-05-22: 2090 mg via INTRAVENOUS
  Filled 2016-05-22: qty 54.97

## 2016-05-22 MED ORDER — SODIUM CHLORIDE 0.9% FLUSH
10.0000 mL | INTRAVENOUS | Status: DC | PRN
  Administered 2016-05-22: 10 mL
  Filled 2016-05-22: qty 10

## 2016-05-22 MED ORDER — PACLITAXEL PROTEIN-BOUND CHEMO INJECTION 100 MG
125.0000 mg/m2 | Freq: Once | INTRAVENOUS | Status: AC
  Administered 2016-05-22: 275 mg via INTRAVENOUS
  Filled 2016-05-22: qty 55

## 2016-05-22 MED ORDER — INSULIN REGULAR HUMAN 100 UNIT/ML IJ SOLN
10.0000 [IU] | Freq: Once | INTRAMUSCULAR | Status: AC
  Administered 2016-05-22: 10 [IU] via SUBCUTANEOUS
  Filled 2016-05-22: qty 0.1

## 2016-05-22 MED ORDER — HEPARIN SOD (PORK) LOCK FLUSH 100 UNIT/ML IV SOLN
500.0000 [IU] | Freq: Once | INTRAVENOUS | Status: AC | PRN
  Administered 2016-05-22: 500 [IU]
  Filled 2016-05-22: qty 5

## 2016-05-22 MED ORDER — PROCHLORPERAZINE MALEATE 10 MG PO TABS
ORAL_TABLET | ORAL | Status: AC
  Filled 2016-05-22: qty 1

## 2016-05-22 MED ORDER — PROCHLORPERAZINE MALEATE 10 MG PO TABS
10.0000 mg | ORAL_TABLET | Freq: Once | ORAL | Status: AC
  Administered 2016-05-22: 10 mg via ORAL

## 2016-05-22 NOTE — Progress Notes (Signed)
  [place] OFFICE PROGRESS NOTE   Diagnosis:  Pancreatic cancer Oncology History   Primary pancreatic adenocarcinoma [facility])   Staging form: Pancreas, AJCC 7th Edition   - Clinical stage from 03/20/2016: Stage IB (T2, N0, M0) - Signed by Truitt Merle, MD on 03/28/2016     Primary pancreatic adenocarcinoma ([place])   03/12/2016 Imaging    CT abdomen and pelvis with contrast showed a 3.1 x 2.0 x 2.6 cm mass at the head of pancreas, preserved fat planes between the pancreatic mass, SMA, and SMV. Question loss of fat plane between the posterior aspect of mass and IVC. Mildly dilated CBD 10 mm.     03/13/2016 Procedure    ERCP and medical stem placement in CBD     03/20/2016 Initial Diagnosis    Primary pancreatic adenocarcinoma ([place])     03/20/2016 Initial Biopsy    FNA of the pancreatic head mass from EUS showed a malignant cells consistent with adenocarcinoma     03/20/2016 Procedure    EUS showed an irregular mass in the pancreatic head, measuring 3.9 x 3.2 cm, no invading of the celiac trunk or portal vein. The mass was biopsied.      04/09/2016 -  Neo-Adjuvant Chemotherapy    Gemcitabine and Abraxane on Day 1, 8 every 21 days     INTERVAL HISTORY:   Craig [name] returns as scheduled. He completed cycle 2 day 8 gemcitabine/Abraxane 05/08/2016. He feels well. He denies nausea/vomiting. No mouth sores. No diarrhea. No numbness or tingling in his hands or feet. He denies pain. He notes no alteration in taste. He continues nutritional supplements.  Objective:  Vital signs in last 24 hours:  Blood pressure (!) 153/70, pulse 95, temperature 98.1 F (36.7 C), temperature source Oral, resp. rate 18, height 5\' 11"  (1.803 m), weight 183 lb 6.4 oz (83.2 kg), SpO2 100 %.    HEENT: Mild white coating over tongue. No buccal thrush. Resp: Lungs clear bilaterally. Cardio: Regular rate and rhythm. GI: Abdomen soft and nontender. No hepatomegaly. No  mass. Vascular: No leg edema. Neuro: Vibratory sense. Minimally decreased over the fingertips per tuning fork exam.  Port-A-Cath without erythema.    Lab Results:  Lab Results  Component Value Date   WBC 5.4 05/22/2016   HGB 10.6 (L) 05/22/2016   HCT 31.8 (L) 05/22/2016   MCV 77.6 (L) 05/22/2016   PLT 299 05/22/2016   NEUTROABS 3.1 05/22/2016    Imaging:  No results found.  Medications: I have reviewed the patient's current medications.  Assessment/Plan: 1. Primary pancreatic adenocarcinoma, in pancreas head, cT2N0M0, stage IB, resectable; initiation of gemcitabine/Abraxane 04/09/2016 (3 weeks on/1 week off).Treatment schedule adjusted to day 1/day 8 of a 21 day cycle beginning 05/01/2016 2. Diabetes mellitus 3. Hypertension 4. Malnutrition. He is being followed by the [place] dietitian 5. Neutropenia secondary to chemotherapy 04/24/2016; cycle 1 day 15 gemcitabine/Abraxane held. Treatment schedule adjusted as above beginning with cycle 2.   Disposition: Craig [name] appears stable. He has completed 2 cycles of gemcitabine/Abraxane. Plan to proceed with cycle 3 today as scheduled. He will return for the day 8 treatment in one week. Dr. Burr Medico recommends restaging CT scans the week of 06/02/2016. He will return for a follow-up visit on 06/12/2016. He will contact the office in the interim with any problems.  Plan reviewed with Dr. Burr Medico.    Ned Card ANP/GNP-BC   05/22/2016  10:45 AM

## 2016-05-22 NOTE — Progress Notes (Signed)
Nutrition follow up completed with patient during infusion for pancreas cancer.  Weight decreased and documented as 183 pounds October 12 decreased from 189 pounds September 7. Glucose on 9/28 was 210. Patient reports he is taking his pancreatic enzymes with meals. Denies diarrhea. Tolerates boost glucose control or glucerna but it is expensive.  Nutrition diagnosis: Unintended weight loss continues.  Intervention: Educated patient to discuss insulin dosage with MD. Encouraged increased Glucerna or equivalent QID. Provided samples and coupons. Continue pancreatic enzymes. Questions answered and teach back method used.  Monitoring, Evaluation, Goals: Patient will increase oral intake and oral nutrition supplements to promote weight gain/stabilization.  Next Visit: Thursday, October 19, during infusion.  **Disclaimer: This note was dictated with voice recognition software. Similar sounding words can inadvertently be transcribed and this note may contain transcription errors which may not have been corrected upon publication of note.**

## 2016-05-22 NOTE — Patient Instructions (Signed)
[facility] Discharge Instructions for Patients Receiving Chemotherapy  Today you received the following chemotherapy agents:  Abraxane, Gemzar.   To help prevent nausea and vomiting after your treatment, we encourage you to take your nausea medication.   If you develop nausea and vomiting that is not controlled by your nausea medication, call the clinic.   BELOW ARE SYMPTOMS THAT SHOULD BE REPORTED IMMEDIATELY:  *FEVER GREATER THAN 100.5 F  *CHILLS WITH OR WITHOUT FEVER  NAUSEA AND VOMITING THAT IS NOT CONTROLLED WITH YOUR NAUSEA MEDICATION  *UNUSUAL SHORTNESS OF BREATH  *UNUSUAL BRUISING OR BLEEDING  TENDERNESS IN MOUTH AND THROAT WITH OR WITHOUT PRESENCE OF ULCERS  *URINARY PROBLEMS  *BOWEL PROBLEMS  UNUSUAL RASH Items with * indicate a potential emergency and should be followed up as soon as possible.  Feel free to call the clinic you have any questions or concerns. The clinic phone number is [phone].  Please show the CHEMO ALERT CARD at check-in to the Emergency Department and triage nurse.   

## 2016-05-22 NOTE — Telephone Encounter (Signed)
Called patients PCP office and informed Coralyn Mark pt was in for tx today and BS is 347, pt states he took his insulin this morning and had a Glucerna. Coralyn Mark spoke with Dr. Mellody Drown - order for 10 units of humalog. Informed Ned Card, NP order confirmed and placed, pt to have CBG in 30 min to an hour after insulin given. Lab called and informed- orders placed. Called and informed Kim in infusion

## 2016-05-22 NOTE — Telephone Encounter (Signed)
Spoke with patient re 11/2 and 11/9 appointments. Also confirmed 10/19 and patient to get new schedule 10/19. Central radiology will call re scan.

## 2016-05-29 ENCOUNTER — Ambulatory Visit: Payer: BLUE CROSS/BLUE SHIELD | Admitting: Nutrition

## 2016-05-29 ENCOUNTER — Other Ambulatory Visit (HOSPITAL_BASED_OUTPATIENT_CLINIC_OR_DEPARTMENT_OTHER): Payer: BLUE CROSS/BLUE SHIELD

## 2016-05-29 ENCOUNTER — Ambulatory Visit (HOSPITAL_BASED_OUTPATIENT_CLINIC_OR_DEPARTMENT_OTHER): Payer: BLUE CROSS/BLUE SHIELD

## 2016-05-29 VITALS — BP 146/80 | HR 69 | Temp 98.8°F | Resp 16

## 2016-05-29 DIAGNOSIS — Z5111 Encounter for antineoplastic chemotherapy: Secondary | ICD-10-CM

## 2016-05-29 DIAGNOSIS — C259 Malignant neoplasm of pancreas, unspecified: Secondary | ICD-10-CM

## 2016-05-29 LAB — COMPREHENSIVE METABOLIC PANEL
ALT: 19 U/L (ref 0–55)
ANION GAP: 12 meq/L — AB (ref 3–11)
AST: 15 U/L (ref 5–34)
Albumin: 3.2 g/dL — ABNORMAL LOW (ref 3.5–5.0)
Alkaline Phosphatase: 126 U/L (ref 40–150)
BILIRUBIN TOTAL: 0.25 mg/dL (ref 0.20–1.20)
BUN: 6.1 mg/dL — ABNORMAL LOW (ref 7.0–26.0)
CHLORIDE: 106 meq/L (ref 98–109)
CO2: 24 meq/L (ref 22–29)
CREATININE: 0.8 mg/dL (ref 0.7–1.3)
Calcium: 9.7 mg/dL (ref 8.4–10.4)
EGFR: >90 mL/min/{1.73_m2} (ref >90)
GLUCOSE: 91 mg/dL (ref 70–140)
Potassium: 3.1 mEq/L — ABNORMAL LOW (ref 3.5–5.1)
SODIUM: 142 meq/L (ref 136–145)
TOTAL PROTEIN: 6.9 g/dL (ref 6.4–8.3)

## 2016-05-29 LAB — CBC WITH DIFFERENTIAL/PLATELET
BASO%: 0.4 % (ref 0.0–2.0)
BASOS ABS: 0 10*3/uL (ref 0.0–0.1)
EOS ABS: 0.1 10*3/uL (ref 0.0–0.5)
EOS%: 1.1 % (ref 0.0–7.0)
HEMATOCRIT: 32.1 % — AB (ref 38.4–49.9)
HEMOGLOBIN: 10.8 g/dL — AB (ref 13.0–17.1)
LYMPH#: 1.4 10*3/uL (ref 0.9–3.3)
LYMPH%: 30.8 % (ref 14.0–49.0)
MCH: 26 pg — ABNORMAL LOW (ref 27.2–33.4)
MCHC: 33.6 g/dL (ref 32.0–36.0)
MCV: 77.3 fL — ABNORMAL LOW (ref 79.3–98.0)
MONO#: 0.4 10*3/uL (ref 0.1–0.9)
MONO%: 8.8 % (ref 0.0–14.0)
NEUT#: 2.6 10*3/uL (ref 1.5–6.5)
NEUT%: 58.9 % (ref 39.0–75.0)
PLATELETS: 284 10*3/uL (ref 140–400)
RBC: 4.15 10*6/uL — AB (ref 4.20–5.82)
RDW: 14.6 % (ref 11.0–14.6)
WBC: 4.5 10*3/uL (ref 4.0–10.3)

## 2016-05-29 MED ORDER — PROCHLORPERAZINE MALEATE 10 MG PO TABS
ORAL_TABLET | ORAL | Status: AC
  Filled 2016-05-29: qty 1

## 2016-05-29 MED ORDER — HEPARIN SOD (PORK) LOCK FLUSH 100 UNIT/ML IV SOLN
500.0000 [IU] | Freq: Once | INTRAVENOUS | Status: AC | PRN
  Administered 2016-05-29: 500 [IU]
  Filled 2016-05-29: qty 5

## 2016-05-29 MED ORDER — SODIUM CHLORIDE 0.9% FLUSH
10.0000 mL | INTRAVENOUS | Status: DC | PRN
  Administered 2016-05-29: 10 mL
  Filled 2016-05-29: qty 10

## 2016-05-29 MED ORDER — PACLITAXEL PROTEIN-BOUND CHEMO INJECTION 100 MG
125.0000 mg/m2 | Freq: Once | INTRAVENOUS | Status: AC
  Administered 2016-05-29: 275 mg via INTRAVENOUS
  Filled 2016-05-29: qty 55

## 2016-05-29 MED ORDER — PROCHLORPERAZINE MALEATE 10 MG PO TABS
10.0000 mg | ORAL_TABLET | Freq: Once | ORAL | Status: AC
  Administered 2016-05-29: 10 mg via ORAL

## 2016-05-29 MED ORDER — SODIUM CHLORIDE 0.9 % IV SOLN
Freq: Once | INTRAVENOUS | Status: AC
  Administered 2016-05-29: 15:00:00 via INTRAVENOUS

## 2016-05-29 MED ORDER — GEMCITABINE HCL CHEMO INJECTION 1 GM/26.3ML
1000.0000 mg/m2 | Freq: Once | INTRAVENOUS | Status: AC
  Administered 2016-05-29: 2090 mg via INTRAVENOUS
  Filled 2016-05-29: qty 54.97

## 2016-05-29 NOTE — Patient Instructions (Signed)
[facility] Discharge Instructions for Patients Receiving Chemotherapy  Today you received the following chemotherapy agents: Abraxane and Gemzar   To help prevent nausea and vomiting after your treatment, we encourage you to take your nausea medication as directed.    If you develop nausea and vomiting that is not controlled by your nausea medication, call the clinic.   BELOW ARE SYMPTOMS THAT SHOULD BE REPORTED IMMEDIATELY:  *FEVER GREATER THAN 100.5 F  *CHILLS WITH OR WITHOUT FEVER  NAUSEA AND VOMITING THAT IS NOT CONTROLLED WITH YOUR NAUSEA MEDICATION  *UNUSUAL SHORTNESS OF BREATH  *UNUSUAL BRUISING OR BLEEDING  TENDERNESS IN MOUTH AND THROAT WITH OR WITHOUT PRESENCE OF ULCERS  *URINARY PROBLEMS  *BOWEL PROBLEMS  UNUSUAL RASH Items with * indicate a potential emergency and should be followed up as soon as possible.  Feel free to call the clinic you have any questions or concerns. The clinic phone number is [phone].  Please show the CHEMO ALERT CARD at check-in to the Emergency Department and triage nurse.   

## 2016-05-29 NOTE — Progress Notes (Signed)
Nutrition follow-up completed with patient during infusion for pancreas cancer. Weight decreased and documented as 177 pounds today, October 26 decreased from 183 pounds October 12. Glucose remains elevated. Patient denies diarrhea. He is drinking one oral nutrition supplement daily.  Nutrition diagnosis: Unintended weight loss continues.  Intervention: Recommended patient increase Glucerna or equivalent twice a day to 3 times a day Provided samples and coupons. Reviewed importance of high-calorie, high-protein foods. Questions were answered.  Teach back method used.  Monitoring, evaluation, goals: Patient will work to increase oral intake to promote weight gain./Stabilization.  Next visit: Thursday, November 9, during infusion.  **Disclaimer: This note was dictated with voice recognition software. Similar sounding words can inadvertently be transcribed and this note may contain transcription errors which may not have been corrected upon publication of note.**

## 2016-06-02 ENCOUNTER — Telehealth: Payer: Self-pay | Admitting: *Deleted

## 2016-06-02 NOTE — Telephone Encounter (Signed)
Called pt back this pm & he states that he gets a little tired but not any different.  Asked if he had checked his glucose & he had not but did so while on the phone & reported that glucose was 225.  Informed to just watch things for now & call if he is feeling worse.  We will see him sooner than scheduled if necessary.

## 2016-06-02 NOTE — Telephone Encounter (Signed)
Received call from pt's wife, Curly [place] stating that pt is extremely tired.  He is exhausted walking from the car to the house.  She states he is losing weight.  Last Hgb was 10.8.  Pt is diabetic & on insulin but not checking blood glucose.  Encouraged to check glucose to see what it is running since he has been elevated in the past & last visit down to 91.  Will discuss with Dr Burr Medico.

## 2016-06-05 ENCOUNTER — Ambulatory Visit (HOSPITAL_COMMUNITY)
Admission: RE | Admit: 2016-06-05 | Discharge: 2016-06-05 | Disposition: A | Discharge status: Home / Self Care | Payer: BLUE CROSS/BLUE SHIELD | Source: Ambulatory Visit | Attending: Nurse Practitioner | Admitting: Nurse Practitioner

## 2016-06-05 ENCOUNTER — Encounter (HOSPITAL_COMMUNITY): Payer: Self-pay

## 2016-06-05 DIAGNOSIS — C259 Malignant neoplasm of pancreas, unspecified: Secondary | ICD-10-CM | POA: Insufficient documentation

## 2016-06-05 MED ORDER — IOPAMIDOL (ISOVUE-300) INJECTION 61%
100.0000 mL | Freq: Once | INTRAVENOUS | Status: AC | PRN
  Administered 2016-06-05: 100 mL via INTRAVENOUS

## 2016-06-10 ENCOUNTER — Ambulatory Visit (HOSPITAL_COMMUNITY): Payer: BLUE CROSS/BLUE SHIELD

## 2016-06-12 ENCOUNTER — Ambulatory Visit (HOSPITAL_BASED_OUTPATIENT_CLINIC_OR_DEPARTMENT_OTHER): Payer: BLUE CROSS/BLUE SHIELD

## 2016-06-12 ENCOUNTER — Telehealth: Payer: Self-pay | Admitting: Hematology

## 2016-06-12 ENCOUNTER — Other Ambulatory Visit (HOSPITAL_BASED_OUTPATIENT_CLINIC_OR_DEPARTMENT_OTHER): Payer: BLUE CROSS/BLUE SHIELD

## 2016-06-12 ENCOUNTER — Encounter: Payer: Self-pay | Admitting: Hematology

## 2016-06-12 ENCOUNTER — Ambulatory Visit (HOSPITAL_BASED_OUTPATIENT_CLINIC_OR_DEPARTMENT_OTHER): Payer: BLUE CROSS/BLUE SHIELD | Admitting: Hematology

## 2016-06-12 VITALS — BP 163/86 | HR 94 | Temp 98.9°F | Resp 18 | Ht 71.0 in | Wt 179.7 lb

## 2016-06-12 DIAGNOSIS — N179 Acute kidney failure, unspecified: Secondary | ICD-10-CM

## 2016-06-12 DIAGNOSIS — E118 Type 2 diabetes mellitus with unspecified complications: Secondary | ICD-10-CM

## 2016-06-12 DIAGNOSIS — E46 Unspecified protein-calorie malnutrition: Secondary | ICD-10-CM | POA: Diagnosis not present

## 2016-06-12 DIAGNOSIS — C259 Malignant neoplasm of pancreas, unspecified: Secondary | ICD-10-CM

## 2016-06-12 DIAGNOSIS — Z5111 Encounter for antineoplastic chemotherapy: Secondary | ICD-10-CM | POA: Diagnosis not present

## 2016-06-12 DIAGNOSIS — C250 Malignant neoplasm of head of pancreas: Secondary | ICD-10-CM

## 2016-06-12 LAB — CBC WITH DIFFERENTIAL/PLATELET
BASO%: 0.3 % (ref 0.0–2.0)
Basophils Absolute: 0 10*3/uL (ref 0.0–0.1)
EOS%: 2.6 % (ref 0.0–7.0)
Eosinophils Absolute: 0.1 10*3/uL (ref 0.0–0.5)
HEMATOCRIT: 33.5 % — AB (ref 38.4–49.9)
HGB: 10.9 g/dL — ABNORMAL LOW (ref 13.0–17.1)
LYMPH#: 1.3 10*3/uL (ref 0.9–3.3)
LYMPH%: 25 % (ref 14.0–49.0)
MCH: 25.7 pg — ABNORMAL LOW (ref 27.2–33.4)
MCHC: 32.6 g/dL (ref 32.0–36.0)
MCV: 79.1 fL — ABNORMAL LOW (ref 79.3–98.0)
MONO#: 0.7 10*3/uL (ref 0.1–0.9)
MONO%: 13.1 % (ref 0.0–14.0)
NEUT%: 59 % (ref 39.0–75.0)
NEUTROS ABS: 3 10*3/uL (ref 1.5–6.5)
PLATELETS: 367 10*3/uL (ref 140–400)
RBC: 4.24 10*6/uL (ref 4.20–5.82)
RDW: 16.6 % — AB (ref 11.0–14.6)
WBC: 5.2 10*3/uL (ref 4.0–10.3)

## 2016-06-12 LAB — COMPREHENSIVE METABOLIC PANEL
ALT: 23 U/L (ref 0–55)
ANION GAP: 7 meq/L (ref 3–11)
AST: 18 U/L (ref 5–34)
Albumin: 3.2 g/dL — ABNORMAL LOW (ref 3.5–5.0)
Alkaline Phosphatase: 137 U/L (ref 40–150)
BILIRUBIN TOTAL: 0.31 mg/dL (ref 0.20–1.20)
BUN: 4 mg/dL — ABNORMAL LOW (ref 7.0–26.0)
CALCIUM: 9 mg/dL (ref 8.4–10.4)
CO2: 26 meq/L (ref 22–29)
CREATININE: 0.8 mg/dL (ref 0.7–1.3)
Chloride: 107 mEq/L (ref 98–109)
EGFR: >90 mL/min/{1.73_m2} (ref >90)
Glucose: 214 mg/dl — ABNORMAL HIGH (ref 70–140)
Potassium: 3.4 mEq/L — ABNORMAL LOW (ref 3.5–5.1)
Sodium: 141 mEq/L (ref 136–145)
TOTAL PROTEIN: 6.5 g/dL (ref 6.4–8.3)

## 2016-06-12 MED ORDER — SODIUM CHLORIDE 0.9 % IV SOLN
1000.0000 mg/m2 | Freq: Once | INTRAVENOUS | Status: AC
  Administered 2016-06-12: 2090 mg via INTRAVENOUS
  Filled 2016-06-12: qty 54.97

## 2016-06-12 MED ORDER — PROCHLORPERAZINE MALEATE 10 MG PO TABS
10.0000 mg | ORAL_TABLET | Freq: Once | ORAL | Status: AC
Start: 2016-06-12 — End: 2016-06-12
  Administered 2016-06-12: 10 mg via ORAL

## 2016-06-12 MED ORDER — PACLITAXEL PROTEIN-BOUND CHEMO INJECTION 100 MG
125.0000 mg/m2 | Freq: Once | INTRAVENOUS | Status: AC
  Administered 2016-06-12: 275 mg via INTRAVENOUS
  Filled 2016-06-12: qty 55

## 2016-06-12 MED ORDER — SODIUM CHLORIDE 0.9 % IV SOLN
Freq: Once | INTRAVENOUS | Status: AC
  Administered 2016-06-12: 10:00:00 via INTRAVENOUS

## 2016-06-12 MED ORDER — HEPARIN SOD (PORK) LOCK FLUSH 100 UNIT/ML IV SOLN
500.0000 [IU] | Freq: Once | INTRAVENOUS | Status: AC | PRN
  Administered 2016-06-12: 500 [IU]
  Filled 2016-06-12: qty 5

## 2016-06-12 MED ORDER — SODIUM CHLORIDE 0.9% FLUSH
10.0000 mL | INTRAVENOUS | Status: DC | PRN
  Administered 2016-06-12: 10 mL
  Filled 2016-06-12: qty 10

## 2016-06-12 MED ORDER — PROCHLORPERAZINE MALEATE 10 MG PO TABS
ORAL_TABLET | ORAL | Status: AC
  Filled 2016-06-12: qty 1

## 2016-06-12 NOTE — Patient Instructions (Signed)
[facility] Discharge Instructions for Patients Receiving Chemotherapy  Today you received the following chemotherapy agents: Abraxane and Gemzar   To help prevent nausea and vomiting after your treatment, we encourage you to take your nausea medication as directed.    If you develop nausea and vomiting that is not controlled by your nausea medication, call the clinic.   BELOW ARE SYMPTOMS THAT SHOULD BE REPORTED IMMEDIATELY:  *FEVER GREATER THAN 100.5 F  *CHILLS WITH OR WITHOUT FEVER  NAUSEA AND VOMITING THAT IS NOT CONTROLLED WITH YOUR NAUSEA MEDICATION  *UNUSUAL SHORTNESS OF BREATH  *UNUSUAL BRUISING OR BLEEDING  TENDERNESS IN MOUTH AND THROAT WITH OR WITHOUT PRESENCE OF ULCERS  *URINARY PROBLEMS  *BOWEL PROBLEMS  UNUSUAL RASH Items with * indicate a potential emergency and should be followed up as soon as possible.  Feel free to call the clinic you have any questions or concerns. The clinic phone number is [phone].  Please show the CHEMO ALERT CARD at check-in to the Emergency Department and triage nurse.   

## 2016-06-12 NOTE — Telephone Encounter (Signed)
Appointments scheduled per 06/12/16 los. Will pick up copy of appointment schedule at next visit on 06/19/16.

## 2016-06-12 NOTE — Progress Notes (Signed)
[place]  [phone] [phone]  Clinic Follow Up Note   Patient Care Team: Craig Panda, MD as PCP - General (Internal Medicine) 06/12/2016  CHIEF COMPLAINTS:  Follow up pancreatic cancer   Oncology History   Primary pancreatic adenocarcinoma [facility])   Staging form: Pancreas, AJCC 7th Edition   - Clinical stage from 03/20/2016: Stage IB (T2, N0, M0) - Signed by Craig Merle, MD on 03/28/2016      Primary pancreatic adenocarcinoma ([place])   03/12/2016 Imaging    CT abdomen and pelvis with contrast showed a 3.1 x 2.0 x 2.6 cm mass at the head of pancreas, preserved fat planes between the pancreatic mass, SMA, and SMV. Question loss of fat plane between the posterior aspect of mass and IVC. Mildly dilated CBD 10 mm.      03/13/2016 Procedure    ERCP and medical stem placement in CBD      03/20/2016 Initial Diagnosis    Primary pancreatic adenocarcinoma ([place])      03/20/2016 Initial Biopsy    FNA of the pancreatic head mass from EUS showed a malignant cells consistent with adenocarcinoma      03/20/2016 Procedure    EUS showed an irregular mass in the pancreatic head, measuring 3.9 x 3.2 cm, no invading of the celiac trunk or portal vein. The mass was biopsied.       04/09/2016 -  Neo-Adjuvant Chemotherapy    Gemcitabine and Abraxane on Day 1, 8 every 21 days         HISTORY OF PRESENTING ILLNESS:  Craig [name] 60 y.o. male is here because of His newly diagnosed pancreatic cancer. He is coming up by his wife to our multidisciplinary check clinic today.  He has had epigastric pain for the last 4-6 weeks. He describes the pain is intermittent, last a few hours and a few times a day, located in the low mid chest and upper abdomen, not related to eating or position. He denies nausea, cough, or dyspnea. His appetite has decreased lately, and he complains about moderate fatigue, he was not able to tolerate his physically demanding job, and he has  been out of week for 3 weeks. He lost about 30-40 lbs in the past 2 months.   He was seen by his primary care physician a few times, and he developed jaundice, and abnormal liver function. He was sent to Hospital by his primary care physician. During his hospital stay, CT scan reviewed a pancreatic head mass, and mild dilatation of bile duct. He was seen by GI Dr. Jacelyn [name], underwent ERCP and metal stent placement in CBD. He subsequently underwent EUS which showed an irregular mass in the pancreatic head, measuring 3.9 cm, no invading of the celiac trunk or portal vein. The mass was biopsied, which showed adenocarcinoma.  He is married, lives with his wife. His abdominal pain, fatigue has not changed much since the CBD stent placement. He is able to tolerate routine activities, no other new complaints. He has been drinking beer moderately for the past 40 years, but stopped a few weeks ago.   CURRENT THERAPY: neoadjuvant chemotherapy gemcitabine and Abraxane ON d1, 8 every 21 days   INTERIM HISTORY: Craig [name] returns for follow-up and cycle 4 day 1 chemotherapy. He is doing well overall, and has been tolerating treatment well. His blood glucose has been much better controlled lately. Mild fatigue, able to function well at home, he is off work. No neuropathy. He lost about  4 pounds in the past 2 weeks.   MEDICAL HISTORY:  Past Medical History:  Diagnosis Date  . Chronic kidney disease   . Diabetes mellitus without complication ([place])   . ETOH abuse   . Hypertension   . Primary pancreatic adenocarcinoma ([place]) 03/28/2016  . Type 2 diabetes mellitus ([place])     SURGICAL HISTORY: Past Surgical History:  Procedure Laterality Date  . ERCP Left 03/13/2016   Procedure: ENDOSCOPIC RETROGRADE CHOLANGIOPANCREATOGRAPHY (ERCP);  Surgeon: Craig Ada, MD;  Location: [facility] ENDOSCOPY;  Service: Endoscopy;  Laterality: Left;  . EUS N/A 03/20/2016   Procedure: UPPER ENDOSCOPIC ULTRASOUND (EUS) LINEAR;  Surgeon:  Craig Ada, MD;  Location: [facility] ENDOSCOPY;  Service: Endoscopy;  Laterality: N/A;  . HERNIA REPAIR    . KNEE ARTHROSCOPY     left and right  . PORTACATH PLACEMENT N/A 04/07/2016   Procedure: INSERTION PORT-A-CATH;  Surgeon: Stark Klein, MD;  Location: [facility];  Service: General;  Laterality: N/A;    SOCIAL HISTORY: Social History   Social History  . Marital status: Married    Spouse name: N/A  . Number of children: N/A  . Years of education: N/A   Occupational History  . Not on file.   Social History Main Topics  . Smoking status: Former Smoker    Packs/day: 1.00    Years: 15.00    Quit date: 08/12/1995  . Smokeless tobacco: Never Used  . Alcohol use Yes     Comment: he used to drink 6 pack beer a day for 40 years, quit in 02/2016   . Drug use: No  . Sexual activity: Not on file   Other Topics Concern  . Not on file   Social History Narrative  . No narrative on file    FAMILY HISTORY: Family History  Problem Relation Age of Onset  . Heart attack Mother     ALLERGIES:  is allergic to no known allergies.  MEDICATIONS:  Current Outpatient Prescriptions  Medication Sig Dispense Refill  . folic acid (FOLVITE) 1 MG tablet Take 1 tablet (1 mg total) by mouth daily. 30 tablet 0  . Insulin Glargine (LANTUS SOLOSTAR) 100 UNIT/ML Solostar Pen Inject 22 Units into the skin every morning.    . lidocaine-prilocaine (EMLA) cream Apply to affected area once 30 g 3  . naproxen sodium (ALEVE) 220 MG tablet Take 440 mg by mouth daily.    . ondansetron (ZOFRAN) 8 MG tablet Take 1 tablet (8 mg total) by mouth every 8 (eight) hours as needed (Nausea or vomiting). 30 tablet 1  . oxyCODONE (OXY IR/ROXICODONE) 5 MG immediate release tablet Take 1-2 tablets (5-10 mg total) by mouth every 6 (six) hours as needed for moderate pain, severe pain or breakthrough pain. 30 tablet 0  . Pancrelipase, Lip-Prot-Amyl, (ZENPEP PO) Take 2 capsules by mouth 3 (three) times daily.    . potassium chloride SA  (K-DUR,KLOR-CON) 20 MEQ tablet Take 1 tablet (20 mEq total) by mouth daily. 30 tablet 1  . prochlorperazine (COMPAZINE) 10 MG tablet Take 1 tablet (10 mg total) by mouth every 8 (eight) hours as needed (Nausea or vomiting). 30 tablet 1  . thiamine 100 MG tablet Take 1 tablet (100 mg total) by mouth daily. 30 tablet 0   No current facility-administered medications for this visit.     REVIEW OF SYSTEMS:   Constitutional: Denies fevers, chills or abnormal night sweats Eyes: Denies blurriness of vision, double vision or watery eyes Ears, nose, mouth, throat, and  face: Denies mucositis or sore throat Respiratory: Denies cough, dyspnea or wheezes Cardiovascular: Denies palpitation, chest discomfort or lower extremity swelling Gastrointestinal:  Denies nausea, heartburn or change in bowel habits Skin: Denies abnormal skin rashes Lymphatics: Denies new lymphadenopathy or easy bruising Neurological:Denies numbness, tingling or new weaknesses Behavioral/Psych: Mood is stable, no new changes  All other systems were reviewed with the patient and are negative.  PHYSICAL EXAMINATION: ECOG PERFORMANCE STATUS: 1 - Symptomatic but completely ambulatory  Vitals:   06/12/16 0910  BP: (!) 163/86  Pulse: 94  Resp: 18  Temp: 98.9 F (37.2 C)   Filed Weights   06/12/16 0910  Weight: 179 lb 11.2 oz (81.5 kg)    GENERAL:alert, no distress and comfortable SKIN: skin color, texture, turgor are normal, no rashes or significant lesions EYES: normal, conjunctiva are pink and non-injected, sclera clear OROPHARYNX:no exudate, no erythema and lips, buccal mucosa, and tongue normal  NECK: supple, thyroid normal size, non-tender, without nodularity LYMPH:  no palpable lymphadenopathy in the cervical, axillary or inguinal LUNGS: clear to auscultation and percussion with normal breathing effort HEART: regular rate & rhythm and no murmurs and no lower extremity edema ABDOMEN:abdomen soft, non-tender and normal  bowel sounds Musculoskeletal:no cyanosis of digits and no clubbing  PSYCH: alert & oriented x 3 with fluent speech NEURO: no focal motor/sensory deficits  LABORATORY DATA:  I have reviewed the data as listed CBC Latest Ref Rng & Units 06/12/2016 05/29/2016 05/22/2016  WBC 4.0 - 10.3 10e3/uL 5.2 4.5 5.4  Hemoglobin 13.0 - 17.1 g/dL 10.9(L) 10.8(L) 10.6(L)  Hematocrit 38.4 - 49.9 % 33.5(L) 32.1(L) 31.8(L)  Platelets 140 - 400 10e3/uL 367 284 299   CMP Latest Ref Rng & Units 06/12/2016 05/29/2016 05/22/2016  Glucose 70 - 140 mg/dl 214(H) 91 347(H)  BUN 7.0 - 26.0 mg/dL 4.0(L) 6.1(L) 7.1  Creatinine 0.7 - 1.3 mg/dL 0.8 0.8 0.9  Sodium 136 - 145 mEq/L 141 142 138  Potassium 3.5 - 5.1 mEq/L 3.4(L) 3.1(L) 3.4(L)  Chloride 101 - 111 mmol/L - - -  CO2 22 - 29 mEq/L 26 24 25   Calcium 8.4 - 10.4 mg/dL 9.0 9.7 8.9  Total Protein 6.4 - 8.3 g/dL 6.5 6.9 6.4  Total Bilirubin 0.20 - 1.20 mg/dL 0.31 0.25 0.35  Alkaline Phos 40 - 150 U/L 137 126 138  AST 5 - 34 U/L 18 15 24   ALT 0 - 55 U/L 23 19 34   PATHOLOGY REPORT  Diagnosis 03/20/2016 FINE NEEDLE ASPIRATION, ENDOSCOPIC, PANCREAS HEAD (SPECIMEN 1 OF 1 COLLECTED 03/20/16): MALIGNANT CELLS CONSISTENT WITH ADENOCARCINOMA. Preliminary Diagnosis Intraoperative Diagnosis: Adequate. ([facility])  Diagnosis 03/13/2016 Duodenum, Biopsy, Duodenal mass - BENIGN ULCERATED AND INFLAMED SMALL BOWEL-TYPE MUCOSA. - THERE IS NO EVIDENCE OF MALIGNANCY. - SEE COMMENT. Microscopic Comment Multiple tissue levels have been examined. Dr. Claudette Laws has reviewed the case and concurs with this interpretation. Dr. Benson Norway was paged on 03/14/2016. (JBK:kh 03/14/16)  RADIOGRAPHIC STUDIES: I have personally reviewed the radiological images as listed and agreed with the findings in the report. Ct Chest W Contrast  Result Date: 06/05/2016 CLINICAL DATA:  Pancreatic adenocarcinoma. Undergoing chemotherapy. Restaging. EXAM: CT CHEST, ABDOMEN, AND PELVIS WITH CONTRAST TECHNIQUE:  Multidetector CT imaging of the chest, abdomen and pelvis was performed following the standard protocol during bolus administration of intravenous contrast. CONTRAST:  18mL ISOVUE-300 IOPAMIDOL (ISOVUE-300) INJECTION 61% COMPARISON:  Chest CT on 04/03/2016 and AP CT on 03/12/2016 FINDINGS: CT CHEST FINDINGS Cardiovascular: No acute findings. Left subclavian Port-A-Cath in appropriate  position. Coronary artery calcification. Aortic atherosclerosis. Mediastinum/Lymph Nodes: No masses or pathologically enlarged lymph nodes identified. Lungs/Pleura: No pulmonary infiltrate or mass identified. No effusion present. Musculoskeletal: Small sclerotic bone lesions in the left posterior second and third ribs are stable and likely represent benign bone islands. No other bone lesions identified. CT ABDOMEN AND PELVIS FINDINGS Hepatobiliary: No definite hepatic masses are identified. Probable tiny sub-cm cyst seen in the right hepatic lobe, and focal fatty infiltration adjacent falciform ligament. Mild periportal edema and pneumobilia now seen with stent in the distal common bile duct. Gallbladder is unremarkable. Pancreas: Stent now seen within the distal common bile duct. Previously seen low-attenuation mass in the pancreatic head and uncinate process is less conspicuous than on previous study. This currently measures approximately 2.0 x 3.0 cm in maximum dimensions on image 47/2, compared with 3.2 x 3.5 cm when measured at same level on previous study. This mass abuts the posterior margin of the superior mesenteric vein on image 164/10 but does not encase the SMV. This mass shows no other vascular involvement. Increased atrophy of the remainder of the pancreas is demonstrated. Spleen:  Within normal limits in size and appearance. Adrenals/Urinary tract: No masses or hydronephrosis. Tiny sub-cm renal cysts again noted. Stomach/Bowel: No evidence of obstruction, inflammatory process, or abnormal fluid collections.  Vascular/Lymphatic: No pathologically enlarged lymph nodes identified. No abdominal aortic aneurysm. Aortic atherosclerosis. Reproductive:  No mass or other significant abnormality identified. Other:  None. Musculoskeletal:  No suspicious bone lesions identified. IMPRESSION: Mildly decreased size of mass within pancreatic head and uncinate process, with common bile duct stent in appropriate position. No evidence of metastatic disease or other acute findings within the chest, abdomen, or pelvis. Electronically Signed   By: Earle Gell M.D.   On: 06/05/2016 14:30   Ct Abdomen Pelvis W Contrast  Result Date: 06/05/2016 CLINICAL DATA:  Pancreatic adenocarcinoma. Undergoing chemotherapy. Restaging. EXAM: CT CHEST, ABDOMEN, AND PELVIS WITH CONTRAST TECHNIQUE: Multidetector CT imaging of the chest, abdomen and pelvis was performed following the standard protocol during bolus administration of intravenous contrast. CONTRAST:  126mL ISOVUE-300 IOPAMIDOL (ISOVUE-300) INJECTION 61% COMPARISON:  Chest CT on 04/03/2016 and AP CT on 03/12/2016 FINDINGS: CT CHEST FINDINGS Cardiovascular: No acute findings. Left subclavian Port-A-Cath in appropriate position. Coronary artery calcification. Aortic atherosclerosis. Mediastinum/Lymph Nodes: No masses or pathologically enlarged lymph nodes identified. Lungs/Pleura: No pulmonary infiltrate or mass identified. No effusion present. Musculoskeletal: Small sclerotic bone lesions in the left posterior second and third ribs are stable and likely represent benign bone islands. No other bone lesions identified. CT ABDOMEN AND PELVIS FINDINGS Hepatobiliary: No definite hepatic masses are identified. Probable tiny sub-cm cyst seen in the right hepatic lobe, and focal fatty infiltration adjacent falciform ligament. Mild periportal edema and pneumobilia now seen with stent in the distal common bile duct. Gallbladder is unremarkable. Pancreas: Stent now seen within the distal common bile duct.  Previously seen low-attenuation mass in the pancreatic head and uncinate process is less conspicuous than on previous study. This currently measures approximately 2.0 x 3.0 cm in maximum dimensions on image 47/2, compared with 3.2 x 3.5 cm when measured at same level on previous study. This mass abuts the posterior margin of the superior mesenteric vein on image 164/10 but does not encase the SMV. This mass shows no other vascular involvement. Increased atrophy of the remainder of the pancreas is demonstrated. Spleen:  Within normal limits in size and appearance. Adrenals/Urinary tract: No masses or hydronephrosis. Tiny sub-cm renal cysts again  noted. Stomach/Bowel: No evidence of obstruction, inflammatory process, or abnormal fluid collections. Vascular/Lymphatic: No pathologically enlarged lymph nodes identified. No abdominal aortic aneurysm. Aortic atherosclerosis. Reproductive:  No mass or other significant abnormality identified. Other:  None. Musculoskeletal:  No suspicious bone lesions identified. IMPRESSION: Mildly decreased size of mass within pancreatic head and uncinate process, with common bile duct stent in appropriate position. No evidence of metastatic disease or other acute findings within the chest, abdomen, or pelvis. Electronically Signed   By: Earle Gell M.D.   On: 06/05/2016 14:30   EUS Dr. Benson Norway  Endosonographic Finding 03/20/2016 Findings: An irregular mass was identified in the pancreatic head. The mass was hypoechoic. The mass measured 39 mm by 32 mm in maximal cross-sectional diameter. The outer margins were irregular. An intact interface was seen between the mass and the celiac trunk and portal vein suggesting a lack of invasion. Fine needle aspiration for cytology was performed. Color Doppler imaging was utilized prior to needle puncture to confirm a lack of significant vascular structures within the needle path. Five passes were made with the 25 gauge needle using a  transduodenal approach. A stylet was used. A cytotechnologist was present to evaluate the adequacy of the specimen. The cellularity of the specimen was adequate. Final cytology results are pending. - A mass was identified in the pancreatic head. Tissue was obtained from this exam, and results are pending. However, the endosonographic appearance is highly suspicious for adenocarcinoma. Fine needle aspiration performed.  ERCP 03/13/2016 Impression: - One covered metal stent was placed into the common bile duct.   ASSESSMENT & PLAN: 60 year old African-American male, with past medical history of diabetes and hypertension, presented with epigastric pain, weight loss, and jaundice.  1. Primary pancreatic adenocarcinoma, in pancreas head, cT2N0M0, stage IB, resectable  -I have reviewed his CT abdomen and pelvis with contrast, EUS and biopsy findings with patient and his wife in great details. -His case was reviewed in our GI tumor board a few days ago. Post CT scan and US showed no evidence of vascular invasion from the pancreatic tumor, this is resectable disease. -CT chest was negative for metastatic disease. I reviewed the results with patient. -We reviewed the nature history of pancreatic cancer, and the overall survival rate with chemotherapy, surgery, and radiation. Patient and his wife was discouraged by the overall dismal long term survival rate (~20%)  -He was seen by surgeon Dr. Barry Dienes, Whipple surgery was discussed and offered to patient. Patient is little reluctant to have the high risk Whipple surgery, will think about it.  -I reviewed the high risk of cancer recurrence after surgery, and the role of neoadjuvant and adjuvant chemotherapy to reduce the risk of recurrence, and shrink the tumor before surgery. -I discussed with Dr. Barry Dienes, we recommend patient to have new adjuvant chemotherapy first. -He is tolerating neoadjuvant chemotherapy well, -I discussed his restaging CT scan from last  week, which showed partial response, with decrease the size of pancreatic tumor, no metastasis. -I'll present his case in tumor board next week, to see if surgery can be offered after this cycle chemotherapy -Lab reviewed, adequate for treatment, we'll proceed to cycle 4 chemotherapy today  2. HTN and DM - he will continue medication and follow up with his primary care physician  -We reviewed her that his blood pressure and blood glucose will need to be monitored closely during the chemotherapy, and his medication may need to be adjusted  -His hypoglycemia has been better controlled lately. I again  discussed diabetic diet, and I strongly encouraged him to exercise  3. Malnutrition -He has lost significant amount weight, albumin is low, he was seen by our dietitian Pamala Hurry and will follow up  -We'll watch his weight and nutrition status closely when he is on chemotherapy -He takes Glucerna 1-2 bottle today  Plan -Lab review, adequate for treatment, we'll proceed C4D1 chemotherapy today, this will likely be last cycle chemo before surgery  -tumor board discussion next week  -Lab and follow-up in 3 weeks   All questions were answered. The patient knows to call the clinic with any problems, questions or concerns. I spent 25 minutes counseling the patient face to face. The total time spent in the appointment was 30 minutes and more than 50% was on counseling.     Craig Merle, MD 06/12/2016

## 2016-06-13 LAB — CANCER ANTIGEN 19-9: CAN 19-9: 38 U/mL — AB (ref 0–35)

## 2016-06-19 ENCOUNTER — Ambulatory Visit: Payer: BLUE CROSS/BLUE SHIELD | Admitting: Nutrition

## 2016-06-19 ENCOUNTER — Ambulatory Visit (HOSPITAL_BASED_OUTPATIENT_CLINIC_OR_DEPARTMENT_OTHER): Payer: BLUE CROSS/BLUE SHIELD

## 2016-06-19 ENCOUNTER — Other Ambulatory Visit (HOSPITAL_BASED_OUTPATIENT_CLINIC_OR_DEPARTMENT_OTHER): Payer: BLUE CROSS/BLUE SHIELD

## 2016-06-19 VITALS — BP 127/91 | HR 112 | Temp 98.4°F | Resp 17

## 2016-06-19 DIAGNOSIS — C259 Malignant neoplasm of pancreas, unspecified: Secondary | ICD-10-CM

## 2016-06-19 DIAGNOSIS — Z5111 Encounter for antineoplastic chemotherapy: Secondary | ICD-10-CM | POA: Diagnosis not present

## 2016-06-19 LAB — COMPREHENSIVE METABOLIC PANEL
ALT: 17 U/L (ref 0–55)
ANION GAP: 10 meq/L (ref 3–11)
AST: 12 U/L (ref 5–34)
Albumin: 3.4 g/dL — ABNORMAL LOW (ref 3.5–5.0)
Alkaline Phosphatase: 131 U/L (ref 40–150)
BUN: 6.7 mg/dL — AB (ref 7.0–26.0)
CALCIUM: 9.9 mg/dL (ref 8.4–10.4)
CHLORIDE: 103 meq/L (ref 98–109)
CO2: 27 mEq/L (ref 22–29)
Creatinine: 0.9 mg/dL (ref 0.7–1.3)
EGFR: >90 mL/min/{1.73_m2} (ref >90)
Glucose: 145 mg/dl — ABNORMAL HIGH (ref 70–140)
POTASSIUM: 3.3 meq/L — AB (ref 3.5–5.1)
Sodium: 141 mEq/L (ref 136–145)
Total Bilirubin: 0.29 mg/dL (ref 0.20–1.20)
Total Protein: 7.4 g/dL (ref 6.4–8.3)

## 2016-06-19 LAB — CBC WITH DIFFERENTIAL/PLATELET
BASO%: 0.4 % (ref 0.0–2.0)
BASOS ABS: 0 10*3/uL (ref 0.0–0.1)
EOS%: 0.7 % (ref 0.0–7.0)
Eosinophils Absolute: 0 10*3/uL (ref 0.0–0.5)
HEMATOCRIT: 34.9 % — AB (ref 38.4–49.9)
HGB: 11.4 g/dL — ABNORMAL LOW (ref 13.0–17.1)
LYMPH#: 1.3 10*3/uL (ref 0.9–3.3)
LYMPH%: 28.9 % (ref 14.0–49.0)
MCH: 25.6 pg — AB (ref 27.2–33.4)
MCHC: 32.7 g/dL (ref 32.0–36.0)
MCV: 78.3 fL — ABNORMAL LOW (ref 79.3–98.0)
MONO#: 0.5 10*3/uL (ref 0.1–0.9)
MONO%: 10.4 % (ref 0.0–14.0)
NEUT#: 2.7 10*3/uL (ref 1.5–6.5)
NEUT%: 59.6 % (ref 39.0–75.0)
Platelets: 222 10*3/uL (ref 140–400)
RBC: 4.46 10*6/uL (ref 4.20–5.82)
RDW: 15.1 % — ABNORMAL HIGH (ref 11.0–14.6)
WBC: 4.5 10*3/uL (ref 4.0–10.3)

## 2016-06-19 MED ORDER — PROCHLORPERAZINE MALEATE 10 MG PO TABS
ORAL_TABLET | ORAL | Status: AC
  Filled 2016-06-19: qty 1

## 2016-06-19 MED ORDER — SODIUM CHLORIDE 0.9 % IV SOLN
1000.0000 mg/m2 | Freq: Once | INTRAVENOUS | Status: AC
  Administered 2016-06-19: 2090 mg via INTRAVENOUS
  Filled 2016-06-19: qty 54.97

## 2016-06-19 MED ORDER — PACLITAXEL PROTEIN-BOUND CHEMO INJECTION 100 MG
125.0000 mg/m2 | Freq: Once | INTRAVENOUS | Status: AC
  Administered 2016-06-19: 275 mg via INTRAVENOUS
  Filled 2016-06-19: qty 55

## 2016-06-19 MED ORDER — PROCHLORPERAZINE MALEATE 10 MG PO TABS
10.0000 mg | ORAL_TABLET | Freq: Once | ORAL | Status: AC
  Administered 2016-06-19: 10 mg via ORAL

## 2016-06-19 MED ORDER — SODIUM CHLORIDE 0.9% FLUSH
10.0000 mL | INTRAVENOUS | Status: DC | PRN
  Administered 2016-06-19: 10 mL
  Filled 2016-06-19: qty 10

## 2016-06-19 MED ORDER — SODIUM CHLORIDE 0.9 % IV SOLN
Freq: Once | INTRAVENOUS | Status: AC
  Administered 2016-06-19: 13:00:00 via INTRAVENOUS

## 2016-06-19 MED ORDER — HEPARIN SOD (PORK) LOCK FLUSH 100 UNIT/ML IV SOLN
500.0000 [IU] | Freq: Once | INTRAVENOUS | Status: AC | PRN
  Administered 2016-06-19: 500 [IU]
  Filled 2016-06-19: qty 5

## 2016-06-19 NOTE — Progress Notes (Signed)
Nutrition follow up completed with patient during infusion for pancreas cancer. Weight increased to 179 pounds from 177 pounds on October 26. Patient pleased with his weight gain. Reports he is drinking 2 oral nutrition supplements daily. Requests additional coupons. Denies other nutrition side effects.  Nutrition Diagnosis: Unintended weight loss continues.  Intervention: Recommend patient continue oral nutrition supplements. Provided coupons.  Monitoring, Evaluation, Goals:  Patient will work to increase oral intake to promote weight gain/weight stabilization.  Next Visit: To be scheduled as needed.

## 2016-06-19 NOTE — Progress Notes (Signed)
DR Burr Medico aware of HR 111 - 112, pt feels well. OK to treat per MD.

## 2016-06-19 NOTE — Patient Instructions (Signed)
[facility] Discharge Instructions for Patients Receiving Chemotherapy  Today you received the following chemotherapy agents:  Abraxane and Gemzar.  To help prevent nausea and vomiting after your treatment, we encourage you to take your nausea medication as prescribed.   If you develop nausea and vomiting that is not controlled by your nausea medication, call the clinic.   BELOW ARE SYMPTOMS THAT SHOULD BE REPORTED IMMEDIATELY:  *FEVER GREATER THAN 100.5 F  *CHILLS WITH OR WITHOUT FEVER  NAUSEA AND VOMITING THAT IS NOT CONTROLLED WITH YOUR NAUSEA MEDICATION  *UNUSUAL SHORTNESS OF BREATH  *UNUSUAL BRUISING OR BLEEDING  TENDERNESS IN MOUTH AND THROAT WITH OR WITHOUT PRESENCE OF ULCERS  *URINARY PROBLEMS  *BOWEL PROBLEMS  UNUSUAL RASH Items with * indicate a potential emergency and should be followed up as soon as possible.  Feel free to call the clinic you have any questions or concerns. The clinic phone number is [phone].  Please show the CHEMO ALERT CARD at check-in to the Emergency Department and triage nurse.   

## 2016-06-30 ENCOUNTER — Other Ambulatory Visit: Payer: Self-pay | Admitting: General Surgery

## 2016-07-07 ENCOUNTER — Telehealth: Payer: Self-pay | Admitting: Hematology

## 2016-07-07 NOTE — Telephone Encounter (Signed)
Appointment date was rescheduled per patient. 07/07/16

## 2016-07-08 ENCOUNTER — Other Ambulatory Visit: Payer: BLUE CROSS/BLUE SHIELD

## 2016-07-08 ENCOUNTER — Ambulatory Visit: Payer: BLUE CROSS/BLUE SHIELD | Admitting: Hematology

## 2016-07-10 ENCOUNTER — Other Ambulatory Visit: Payer: BLUE CROSS/BLUE SHIELD

## 2016-07-10 ENCOUNTER — Ambulatory Visit: Payer: BLUE CROSS/BLUE SHIELD | Admitting: Hematology

## 2016-07-10 NOTE — Progress Notes (Deleted)
[facility]  [phone] [phone]  Clinic Follow Up Note   Patient Care Team: Jilda Panda, MD as PCP - General (Internal Medicine) 07/10/2016  CHIEF COMPLAINTS:  Follow up pancreatic cancer   Oncology History   Primary pancreatic adenocarcinoma [facility])   Staging form: Pancreas, AJCC 7th Edition   - Clinical stage from 03/20/2016: Stage IB (T2, N0, M0) - Signed by Truitt Merle, MD on 03/28/2016      Primary pancreatic adenocarcinoma ([place])   03/12/2016 Imaging    CT abdomen and pelvis with contrast showed a 3.1 x 2.0 x 2.6 cm mass at the head of pancreas, preserved fat planes between the pancreatic mass, SMA, and SMV. Question loss of fat plane between the posterior aspect of mass and IVC. Mildly dilated CBD 10 mm.      03/13/2016 Procedure    ERCP and medical stem placement in CBD      03/20/2016 Initial Diagnosis    Primary pancreatic adenocarcinoma ([place])      03/20/2016 Initial Biopsy    FNA of the pancreatic head mass from EUS showed a malignant cells consistent with adenocarcinoma      03/20/2016 Procedure    EUS showed an irregular mass in the pancreatic head, measuring 3.9 x 3.2 cm, no invading of the celiac trunk or portal vein. The mass was biopsied.       04/09/2016 -  Neo-Adjuvant Chemotherapy    Gemcitabine and Abraxane on Day 1, 8 every 21 days         HISTORY OF PRESENTING ILLNESS:  Craig [name] 60 y.o. male is here because of His newly diagnosed pancreatic cancer. He is coming up by his wife to our multidisciplinary check clinic today.  He has had epigastric pain for the last 4-6 weeks. He describes the pain is intermittent, last a few hours and a few times a day, located in the low mid chest and upper abdomen, not related to eating or position. He denies nausea, cough, or dyspnea. His appetite has decreased lately, and he complains about moderate fatigue, he was not able to tolerate his physically demanding job, and he has  been out of week for 3 weeks. He lost about 30-40 lbs in the past 2 months.   He was seen by his primary care physician a few times, and he developed jaundice, and abnormal liver function. He was sent to Hospital by his primary care physician. During his hospital stay, CT scan reviewed a pancreatic head mass, and mild dilatation of bile duct. He was seen by GI Dr. Jacelyn Grip, underwent ERCP and metal stent placement in CBD. He subsequently underwent EUS which showed an irregular mass in the pancreatic head, measuring 3.9 cm, no invading of the celiac trunk or portal vein. The mass was biopsied, which showed adenocarcinoma.  He is married, lives with his wife. His abdominal pain, fatigue has not changed much since the CBD stent placement. He is able to tolerate routine activities, no other new complaints. He has been drinking beer moderately for the past 40 years, but stopped a few weeks ago.   CURRENT THERAPY: neoadjuvant chemotherapy gemcitabine and Abraxane ON d1, 8 every 21 days   INTERIM HISTORY: [name] returns for follow-up and cycle 4 day 1 chemotherapy. He is doing well overall, and has been tolerating treatment well. His blood glucose has been much better controlled lately. Mild fatigue, able to function well at home, he is off work. No neuropathy. He lost about  4 pounds in the past 2 weeks.   MEDICAL HISTORY:  Past Medical History:  Diagnosis Date  . Chronic kidney disease   . Diabetes mellitus without complication ([place])   . ETOH abuse   . Hypertension   . Primary pancreatic adenocarcinoma ([place]) 03/28/2016  . Type 2 diabetes mellitus ([place])     SURGICAL HISTORY: Past Surgical History:  Procedure Laterality Date  . ERCP Left 03/13/2016   Procedure: ENDOSCOPIC RETROGRADE CHOLANGIOPANCREATOGRAPHY (ERCP);  Surgeon: Carol Ada, MD;  Location: [facility] ENDOSCOPY;  Service: Endoscopy;  Laterality: Left;  . EUS N/A 03/20/2016   Procedure: UPPER ENDOSCOPIC ULTRASOUND (EUS) LINEAR;  Surgeon:  Carol Ada, MD;  Location: [facility] ENDOSCOPY;  Service: Endoscopy;  Laterality: N/A;  . HERNIA REPAIR    . KNEE ARTHROSCOPY     left and right  . PORTACATH PLACEMENT N/A 04/07/2016   Procedure: INSERTION PORT-A-CATH;  Surgeon: Stark Klein, MD;  Location: [place];  Service: General;  Laterality: N/A;    SOCIAL HISTORY: Social History   Social History  . Marital status: Married    Spouse name: N/A  . Number of children: N/A  . Years of education: N/A   Occupational History  . Not on file.   Social History Main Topics  . Smoking status: Former Smoker    Packs/day: 1.00    Years: 15.00    Quit date: 08/12/1995  . Smokeless tobacco: Never Used  . Alcohol use Yes     Comment: he used to drink 6 pack beer a day for 40 years, quit in 02/2016   . Drug use: No  . Sexual activity: Not on file   Other Topics Concern  . Not on file   Social History Narrative  . No narrative on file    FAMILY HISTORY: Family History  Problem Relation Age of Onset  . Heart attack Mother     ALLERGIES:  is allergic to no known allergies.  MEDICATIONS:  Current Outpatient Prescriptions  Medication Sig Dispense Refill  . folic acid (FOLVITE) 1 MG tablet Take 1 tablet (1 mg total) by mouth daily. 30 tablet 0  . Insulin Glargine (LANTUS SOLOSTAR) 100 UNIT/ML Solostar Pen Inject 22 Units into the skin every morning.    . lidocaine-prilocaine (EMLA) cream Apply to affected area once 30 g 3  . naproxen sodium (ALEVE) 220 MG tablet Take 440 mg by mouth daily.    . ondansetron (ZOFRAN) 8 MG tablet Take 1 tablet (8 mg total) by mouth every 8 (eight) hours as needed (Nausea or vomiting). 30 tablet 1  . oxyCODONE (OXY IR/ROXICODONE) 5 MG immediate release tablet Take 1-2 tablets (5-10 mg total) by mouth every 6 (six) hours as needed for moderate pain, severe pain or breakthrough pain. 30 tablet 0  . Pancrelipase, Lip-Prot-Amyl, (ZENPEP PO) Take 2 capsules by mouth 3 (three) times daily.    . potassium chloride SA  (K-DUR,KLOR-CON) 20 MEQ tablet Take 1 tablet (20 mEq total) by mouth daily. 30 tablet 1  . prochlorperazine (COMPAZINE) 10 MG tablet Take 1 tablet (10 mg total) by mouth every 8 (eight) hours as needed (Nausea or vomiting). 30 tablet 1  . thiamine 100 MG tablet Take 1 tablet (100 mg total) by mouth daily. 30 tablet 0   No current facility-administered medications for this visit.     REVIEW OF SYSTEMS:   Constitutional: Denies fevers, chills or abnormal night sweats Eyes: Denies blurriness of vision, double vision or watery eyes Ears, nose, mouth, throat, and  face: Denies mucositis or sore throat Respiratory: Denies cough, dyspnea or wheezes Cardiovascular: Denies palpitation, chest discomfort or lower extremity swelling Gastrointestinal:  Denies nausea, heartburn or change in bowel habits Skin: Denies abnormal skin rashes Lymphatics: Denies new lymphadenopathy or easy bruising Neurological:Denies numbness, tingling or new weaknesses Behavioral/Psych: Mood is stable, no new changes  All other systems were reviewed with the patient and are negative.  PHYSICAL EXAMINATION: ECOG PERFORMANCE STATUS: 1 - Symptomatic but completely ambulatory  There were no vitals filed for this visit. There were no vitals filed for this visit.  GENERAL:alert, no distress and comfortable SKIN: skin color, texture, turgor are normal, no rashes or significant lesions EYES: normal, conjunctiva are pink and non-injected, sclera clear OROPHARYNX:no exudate, no erythema and lips, buccal mucosa, and tongue normal  NECK: supple, thyroid normal size, non-tender, without nodularity LYMPH:  no palpable lymphadenopathy in the cervical, axillary or inguinal LUNGS: clear to auscultation and percussion with normal breathing effort HEART: regular rate & rhythm and no murmurs and no lower extremity edema ABDOMEN:abdomen soft, non-tender and normal bowel sounds Musculoskeletal:no cyanosis of digits and no clubbing  PSYCH:  alert & oriented x 3 with fluent speech NEURO: no focal motor/sensory deficits  LABORATORY DATA:  I have reviewed the data as listed CBC Latest Ref Rng & Units 06/19/2016 06/12/2016 05/29/2016  WBC 4.0 - 10.3 10e3/uL 4.5 5.2 4.5  Hemoglobin 13.0 - 17.1 g/dL 11.4(L) 10.9(L) 10.8(L)  Hematocrit 38.4 - 49.9 % 34.9(L) 33.5(L) 32.1(L)  Platelets 140 - 400 10e3/uL 222 367 284   CMP Latest Ref Rng & Units 06/19/2016 06/12/2016 05/29/2016  Glucose 70 - 140 mg/dl 145(H) 214(H) 91  BUN 7.0 - 26.0 mg/dL 6.7(L) 4.0(L) 6.1(L)  Creatinine 0.7 - 1.3 mg/dL 0.9 0.8 0.8  Sodium 136 - 145 mEq/L 141 141 142  Potassium 3.5 - 5.1 mEq/L 3.3(L) 3.4(L) 3.1(L)  Chloride 101 - 111 mmol/L - - -  CO2 22 - 29 mEq/L 27 26 24   Calcium 8.4 - 10.4 mg/dL 9.9 9.0 9.7  Total Protein 6.4 - 8.3 g/dL 7.4 6.5 6.9  Total Bilirubin 0.20 - 1.20 mg/dL 0.29 0.31 0.25  Alkaline Phos 40 - 150 U/L 131 137 126  AST 5 - 34 U/L 12 18 15   ALT 0 - 55 U/L 17 23 19    CA19.9 03/29/2016: 189 04/17/2016: 177 04/14/2016: 94 06/12/2016: 38   PATHOLOGY REPORT  Diagnosis 03/20/2016 FINE NEEDLE ASPIRATION, ENDOSCOPIC, PANCREAS HEAD (SPECIMEN 1 OF 1 COLLECTED 03/20/16): MALIGNANT CELLS CONSISTENT WITH ADENOCARCINOMA. Preliminary Diagnosis Intraoperative Diagnosis: Adequate. ([facility])  Diagnosis 03/13/2016 Duodenum, Biopsy, Duodenal mass - BENIGN ULCERATED AND INFLAMED SMALL BOWEL-TYPE MUCOSA. - THERE IS NO EVIDENCE OF MALIGNANCY. - SEE COMMENT. Microscopic Comment Multiple tissue levels have been examined. Dr. Claudette Laws has reviewed the case and concurs with this interpretation. Dr. Benson Norway was paged on 03/14/2016. (JBK:kh 03/14/16)  RADIOGRAPHIC STUDIES: I have personally reviewed the radiological images as listed and agreed with the findings in the report. No results found. EUS Dr. Benson Norway  Endosonographic Finding 03/20/2016 Findings: An irregular mass was identified in the pancreatic head. The mass was hypoechoic. The mass measured 39 mm by 32 mm  in maximal cross-sectional diameter. The outer margins were irregular. An intact interface was seen between the mass and the celiac trunk and portal vein suggesting a lack of invasion. Fine needle aspiration for cytology was performed. Color Doppler imaging was utilized prior to needle puncture to confirm a lack of significant vascular structures within the needle path. Five  passes were made with the 25 gauge needle using a transduodenal approach. A stylet was used. A cytotechnologist was present to evaluate the adequacy of the specimen. The cellularity of the specimen was adequate. Final cytology results are pending. - A mass was identified in the pancreatic head. Tissue was obtained from this exam, and results are pending. However, the endosonographic appearance is highly suspicious for adenocarcinoma. Fine needle aspiration performed.  ERCP 03/13/2016 Impression: - One covered metal stent was placed into the common bile duct.   ASSESSMENT & PLAN: 59 year old African-American male, with past medical history of diabetes and hypertension, presented with epigastric pain, weight loss, and jaundice.  1. Primary pancreatic adenocarcinoma, in pancreas head, cT2N0M0, stage IB, resectable  -I have reviewed his CT abdomen and pelvis with contrast, EUS and biopsy findings with patient and his wife in great details. -His case was reviewed in our GI tumor board a few days ago. Post CT scan and US showed no evidence of vascular invasion from the pancreatic tumor, this is resectable disease. -CT chest was negative for metastatic disease. I reviewed the results with patient. -We reviewed the nature history of pancreatic cancer, and the overall survival rate with chemotherapy, surgery, and radiation. Patient and his wife was discouraged by the overall dismal long term survival rate (~20%)  -He was seen by surgeon Dr. Barry Dienes, Whipple surgery was discussed and offered to patient. Patient is little reluctant to  have the high risk Whipple surgery, will think about it.  -I reviewed the high risk of cancer recurrence after surgery, and the role of neoadjuvant and adjuvant chemotherapy to reduce the risk of recurrence, and shrink the tumor before surgery. -I discussed with Dr. Barry Dienes, we recommend patient to have new adjuvant chemotherapy first. -He is tolerating neoadjuvant chemotherapy well, -I discussed his restaging CT scan from last week, which showed partial response, with decrease the size of pancreatic tumor, no metastasis. -I'll present his case in tumor board next week, to see if surgery can be offered after this cycle chemotherapy -Lab reviewed, adequate for treatment, we'll proceed to cycle 4 chemotherapy today  2. HTN and DM - he will continue medication and follow up with his primary care physician  -We reviewed her that his blood pressure and blood glucose will need to be monitored closely during the chemotherapy, and his medication may need to be adjusted  -His hypoglycemia has been better controlled lately. I again discussed diabetic diet, and I strongly encouraged him to exercise  3. Malnutrition -He has lost significant amount weight, albumin is low, he was seen by our dietitian Pamala Hurry and will follow up  -We'll watch his weight and nutrition status closely when he is on chemotherapy -He takes Glucerna 1-2 bottle today  Plan -Lab review, adequate for treatment, we'll proceed C4D1 chemotherapy today, this will likely be last cycle chemo before surgery  -tumor board discussion next week  -Lab and follow-up in 3 weeks   All questions were answered. The patient knows to call the clinic with any problems, questions or concerns. I spent 25 minutes counseling the patient face to face. The total time spent in the appointment was 30 minutes and more than 50% was on counseling.     Truitt Merle, MD 07/10/2016

## 2016-07-15 ENCOUNTER — Telehealth: Payer: Self-pay | Admitting: *Deleted

## 2016-07-15 NOTE — Telephone Encounter (Signed)
Left VM to inquire how he was doing if he had made a decision regarding surgery. Left direct # of navigator for return call.

## 2016-07-16 ENCOUNTER — Telehealth: Payer: Self-pay | Admitting: *Deleted

## 2016-07-16 NOTE — Telephone Encounter (Signed)
Patient called back and confirmed his appointments on 12/8 and 12/15 for lab/chemo. He will be having surgery in January. Informed him that MD does not want him to go so long without treatment. He understands and agrees.

## 2016-07-16 NOTE — Telephone Encounter (Signed)
Per staff message I have scheduled appts and called the patient with appts. I left message for patient on the machine

## 2016-07-17 NOTE — Progress Notes (Signed)
[place]  [phone]) [phone] [phone]  Clinic Follow Up Note   Patient Care Team: Jilda Panda, MD as PCP - General (Internal Medicine) Tania Ade, RN as Registered Nurse 07/18/2016  CHIEF COMPLAINTS:  Follow up pancreatic cancer   Oncology History   Primary pancreatic adenocarcinoma [facility])   Staging form: Pancreas, AJCC 7th Edition   - Clinical stage from 03/20/2016: Stage IB (T2, N0, M0) - Signed by Truitt Merle, MD on 03/28/2016      Primary pancreatic adenocarcinoma ([place])   03/12/2016 Imaging    CT abdomen and pelvis with contrast showed a 3.1 x 2.0 x 2.6 cm mass at the head of pancreas, preserved fat planes between the pancreatic mass, SMA, and SMV. Question loss of fat plane between the posterior aspect of mass and IVC. Mildly dilated CBD 10 mm.      03/13/2016 Procedure    ERCP and medical stem placement in CBD      03/20/2016 Initial Diagnosis    Primary pancreatic adenocarcinoma ([place])      03/20/2016 Initial Biopsy    FNA of the pancreatic head mass from EUS showed a malignant cells consistent with adenocarcinoma      03/20/2016 Procedure    EUS showed an irregular mass in the pancreatic head, measuring 3.9 x 3.2 cm, no invading of the celiac trunk or portal vein. The mass was biopsied.       04/09/2016 -  Neo-Adjuvant Chemotherapy    Gemcitabine and Abraxane on Day 1, 8 every 21 days         HISTORY OF PRESENTING ILLNESS:  Craig [name] 60 y.o. male is here because of His newly diagnosed pancreatic cancer. He is coming up by his wife to our multidisciplinary check clinic today.  He has had epigastric pain for the last 4-6 weeks. He describes the pain is intermittent, last a few hours and a few times a day, located in the low mid chest and upper abdomen, not related to eating or position. He denies nausea, cough, or dyspnea. His appetite has decreased lately, and he complains about moderate fatigue, he was not able to tolerate  his physically demanding job, and he has been out of week for 3 weeks. He lost about 30-40 lbs in the past 2 months.   He was seen by his primary care physician a few times, and he developed jaundice, and abnormal liver function. He was sent to Hospital by his primary care physician. During his hospital stay, CT scan reviewed a pancreatic head mass, and mild dilatation of bile duct. He was seen by GI Dr. Jacelyn Grip, underwent ERCP and metal stent placement in CBD. He subsequently underwent EUS which showed an irregular mass in the pancreatic head, measuring 3.9 cm, no invading of the celiac trunk or portal vein. The mass was biopsied, which showed adenocarcinoma.  He is married, lives with his wife. His abdominal pain, fatigue has not changed much since the CBD stent placement. He is able to tolerate routine activities, no other new complaints. He has been drinking beer moderately for the past 40 years, but stopped a few weeks ago.   CURRENT THERAPY: neoadjuvant chemotherapy gemcitabine and Abraxane on D1, 8 every 21 days   INTERIM HISTORY: Craig [name] returns for follow-up and treatment. He was seen by surgeon Dr. Barry Dienes a few weeks ago, Whipple was offered, and he finally decided to take the surgery. It is scheduled for 08/21/2016. Dr. Barry Dienes would like him to have  one more cycle chemotherapy before surgery. Patient was seen in the treatment area. States he doesn't like taking chemotherapy, it took him about two weeks to "bounce back" after his last treatment. He denies any pain or nausea. He reports low strength and fatigue. He is still able to do things. He needs more test strips to check his blood sugar at home. He plans to call his primary care physician about this.    MEDICAL HISTORY:  Past Medical History:  Diagnosis Date  . Chronic kidney disease   . Diabetes mellitus without complication ([place])   . ETOH abuse   . Hypertension   . Primary pancreatic adenocarcinoma ([place]) 03/28/2016  . Type 2  diabetes mellitus ([place])     SURGICAL HISTORY: Past Surgical History:  Procedure Laterality Date  . ERCP Left 03/13/2016   Procedure: ENDOSCOPIC RETROGRADE CHOLANGIOPANCREATOGRAPHY (ERCP);  Surgeon: Carol Ada, MD;  Location: [facility] ENDOSCOPY;  Service: Endoscopy;  Laterality: Left;  . EUS N/A 03/20/2016   Procedure: UPPER ENDOSCOPIC ULTRASOUND (EUS) LINEAR;  Surgeon: Carol Ada, MD;  Location: [facility] ENDOSCOPY;  Service: Endoscopy;  Laterality: N/A;  . HERNIA REPAIR    . KNEE ARTHROSCOPY     left and right  . PORTACATH PLACEMENT N/A 04/07/2016   Procedure: INSERTION PORT-A-CATH;  Surgeon: Stark Klein, MD;  Location: [place];  Service: General;  Laterality: N/A;    SOCIAL HISTORY: Social History   Social History  . Marital status: Married    Spouse name: N/A  . Number of children: N/A  . Years of education: N/A   Occupational History  . Not on file.   Social History Main Topics  . Smoking status: Former Smoker    Packs/day: 1.00    Years: 15.00    Quit date: 08/12/1995  . Smokeless tobacco: Never Used  . Alcohol use Yes     Comment: he used to drink 6 pack beer a day for 40 years, quit in 02/2016   . Drug use: No  . Sexual activity: Not on file   Other Topics Concern  . Not on file   Social History Narrative  . No narrative on file    FAMILY HISTORY: Family History  Problem Relation Age of Onset  . Heart attack Mother     ALLERGIES:  is allergic to no known allergies.  MEDICATIONS:  Current Outpatient Prescriptions  Medication Sig Dispense Refill  . folic acid (FOLVITE) 1 MG tablet Take 1 tablet (1 mg total) by mouth daily. 30 tablet 0  . Insulin Glargine (LANTUS SOLOSTAR) 100 UNIT/ML Solostar Pen Inject 22 Units into the skin every morning.    . lidocaine-prilocaine (EMLA) cream Apply to affected area once 30 g 3  . naproxen sodium (ALEVE) 220 MG tablet Take 440 mg by mouth daily.    . ondansetron (ZOFRAN) 8 MG tablet Take 1 tablet (8 mg total) by mouth every 8  (eight) hours as needed (Nausea or vomiting). 30 tablet 1  . oxyCODONE (OXY IR/ROXICODONE) 5 MG immediate release tablet Take 1-2 tablets (5-10 mg total) by mouth every 6 (six) hours as needed for moderate pain, severe pain or breakthrough pain. 30 tablet 0  . Pancrelipase, Lip-Prot-Amyl, (ZENPEP PO) Take 2 capsules by mouth 3 (three) times daily.    . potassium chloride SA (K-DUR,KLOR-CON) 20 MEQ tablet Take 1 tablet (20 mEq total) by mouth daily. 30 tablet 1  . prochlorperazine (COMPAZINE) 10 MG tablet Take 1 tablet (10 mg total) by mouth every 8 (eight) hours  as needed (Nausea or vomiting). 30 tablet 1  . thiamine 100 MG tablet Take 1 tablet (100 mg total) by mouth daily. 30 tablet 0   No current facility-administered medications for this visit.     REVIEW OF SYSTEMS:   Constitutional: Denies fevers, chills or abnormal night sweats (+) fatigue Eyes: Denies blurriness of vision, double vision or watery eyes Ears, nose, mouth, throat, and face: Denies mucositis or sore throat Respiratory: Denies cough, dyspnea or wheezes Cardiovascular: Denies palpitation, chest discomfort or lower extremity swelling Gastrointestinal:  Denies nausea, heartburn or change in bowel habits Skin: Denies abnormal skin rashes Lymphatics: Denies new lymphadenopathy or easy bruising Neurological:Denies numbness, tingling or new weaknesses Behavioral/Psych: Mood is stable, no new changes  All other systems were reviewed with the patient and are negative.  PHYSICAL EXAMINATION: ECOG PERFORMANCE STATUS: 1 - Symptomatic but completely ambulatory BP 129/83, heart rate 99, respiratory rate 16, temperature 90.9, pulse ox 100% on room air GENERAL:alert, no distress and comfortable. In treatment chair. SKIN: skin color, texture, turgor are normal, no rashes or significant lesions EYES: normal, conjunctiva are pink and non-injected, sclera clear OROPHARYNX:no exudate, no erythema and lips, buccal mucosa, and tongue normal   NECK: supple, thyroid normal size, non-tender, without nodularity LYMPH:  no palpable lymphadenopathy in the cervical, axillary or inguinal LUNGS: clear to auscultation and percussion with normal breathing effort HEART: regular rate & rhythm and no murmurs and no lower extremity edema ABDOMEN:abdomen soft, non-tender and normal bowel sounds Musculoskeletal:no cyanosis of digits and no clubbing  PSYCH: alert & oriented x 3 with fluent speech NEURO: no focal motor/sensory deficits  LABORATORY DATA:  I have reviewed the data as listed CBC Latest Ref Rng & Units 07/18/2016 06/19/2016 06/12/2016  WBC 4.0 - 10.3 10e3/uL 6.1 4.5 5.2  Hemoglobin 13.0 - 17.1 g/dL 12.1(L) 11.4(L) 10.9(L)  Hematocrit 38.4 - 49.9 % 38.5 34.9(L) 33.5(L)  Platelets 140 - 400 10e3/uL 313 222 367   CMP Latest Ref Rng & Units 07/18/2016 06/19/2016 06/12/2016  Glucose 70 - 140 mg/dl 110 145(H) 214(H)  BUN 7.0 - 26.0 mg/dL 5.9(L) 6.7(L) 4.0(L)  Creatinine 0.7 - 1.3 mg/dL 0.9 0.9 0.8  Sodium 136 - 145 mEq/L 141 141 141  Potassium 3.5 - 5.1 mEq/L 3.4(L) 3.3(L) 3.4(L)  Chloride 101 - 111 mmol/L - - -  CO2 22 - 29 mEq/L 28 27 26   Calcium 8.4 - 10.4 mg/dL 9.6 9.9 9.0  Total Protein 6.4 - 8.3 g/dL 7.0 7.4 6.5  Total Bilirubin 0.20 - 1.20 mg/dL 0.34 0.29 0.31  Alkaline Phos 40 - 150 U/L 116 131 137  AST 5 - 34 U/L 14 12 18   ALT 0 - 55 U/L 19 17 23    PATHOLOGY REPORT  Diagnosis 03/20/2016 FINE NEEDLE ASPIRATION, ENDOSCOPIC, PANCREAS HEAD (SPECIMEN 1 OF 1 COLLECTED 03/20/16): MALIGNANT CELLS CONSISTENT WITH ADENOCARCINOMA. Preliminary Diagnosis Intraoperative Diagnosis: Adequate. ([facility])  Diagnosis 03/13/2016 Duodenum, Biopsy, Duodenal mass - BENIGN ULCERATED AND INFLAMED SMALL BOWEL-TYPE MUCOSA. - THERE IS NO EVIDENCE OF MALIGNANCY. - SEE COMMENT. Microscopic Comment Multiple tissue levels have been examined. Dr. Claudette Laws has reviewed the case and concurs with this interpretation. Dr. Benson Norway was paged on 03/14/2016. (JBK:kh  03/14/16)  RADIOGRAPHIC STUDIES: I have personally reviewed the radiological images as listed and agreed with the findings in the report. No results found. EUS Dr. Benson Norway  Endosonographic Finding 03/20/2016 Findings: An irregular mass was identified in the pancreatic head. The mass was hypoechoic. The mass measured 39 mm by 32  mm in maximal cross-sectional diameter. The outer margins were irregular. An intact interface was seen between the mass and the celiac trunk and portal vein suggesting a lack of invasion. Fine needle aspiration for cytology was performed. Color Doppler imaging was utilized prior to needle puncture to confirm a lack of significant vascular structures within the needle path. Five passes were made with the 25 gauge needle using a transduodenal approach. A stylet was used. A cytotechnologist was present to evaluate the adequacy of the specimen. The cellularity of the specimen was adequate. Final cytology results are pending. - A mass was identified in the pancreatic head. Tissue was obtained from this exam, and results are pending. However, the endosonographic appearance is highly suspicious for adenocarcinoma. Fine needle aspiration performed.  ERCP 03/13/2016 Impression: - One covered metal stent was placed into the common bile duct.  CT chest, abdomen, pelvis w contrast 06/05/2016 IMPRESSION: Mildly decreased size of mass within pancreatic head and uncinate process, with common bile duct stent in appropriate position.  No evidence of metastatic disease or other acute findings within the chest, abdomen, or pelvis.  ASSESSMENT & PLAN: 60 year old African-American male, with past medical history of diabetes and hypertension, presented with epigastric pain, weight loss, and jaundice.  1. Primary pancreatic adenocarcinoma, in pancreas head, cT2N0M0, stage IB, resectable  -I previously reviewed his CT abdomen and pelvis with contrast, EUS and biopsy findings with patient  and his wife in great details. -His case was previously reviewed in our GI tumor board a few days ago. Post CT scan and US showed no evidence of vascular invasion from the pancreatic tumor, this is resectable disease. -CT chest was negative for metastatic disease. I reviewed the results with patient. -We previously reviewed the nature history of pancreatic cancer, and the overall survival rate with chemotherapy, surgery, and radiation. Patient and his wife was discouraged by the overall dismal long term survival rate (~20%)  -He was seen by surgeon Dr. Barry Dienes, Whipple surgery was discussed and offered to patient. Patient is little reluctant to have the high risk Whipple surgery, will think about it.  -I previously reviewed the high risk of cancer recurrence after surgery, and the role of neoadjuvant and adjuvant chemotherapy to reduce the risk of recurrence, and shrink the tumor before surgery. -I previously discussed with Dr. Barry Dienes, we recommend patient to have new adjuvant chemotherapy first. -He is tolerating neoadjuvant chemotherapy well, -I previously discussed his restaging CT scan after 4 cycles of chemo, which showed partial response, with decrease the size of pancreatic tumor, no metastasis. -The patient will undergo surgery with Dr. Barry Dienes on 08/21/2016 -Lab reviewed, adequate for treatment, we'll proceed to cycle 5 chemotherapy today and next week, this will be his last cycle chemo before surgery  -we discussed the role of adjuvant chemo, I will likely give him 2 more months of adjuvant chemo gemcitabine + Xeloda. The benefit and risks were discussed with him, and will finalize it on his next post-op f/u.   2. HTN and DM - he will continue medication and follow up with his primary care physician  -We reviewed her that his blood pressure and blood glucose will need to be monitored closely during the chemotherapy, and his medication may need to be adjusted  -His hypoglycemia has been better  controlled lately. I again discussed diabetic diet, and I strongly encouraged him to exercise -He needs more test strips to check his blood sugar at home. He plans to call his primary care physician about  this.  3. Malnutrition -He has lost significant amount weight, albumin is low, he was seen by our dietitian Pamala Hurry and will follow up  -We'll watch his weight and nutrition status closely when he is on chemotherapy -He takes Glucerna 1-2 bottle   Plan -Lab reviewed, adequate for treatment, we'll proceed C5D1 chemotherapy today -He will return in one week for C5D8 chemo (last dose before surgery) -He is scheduled for surgery with Dr. Barry Dienes on 08/21/2016 -I will see him again in early to mid-February for follow up and labs   All questions were answered. The patient knows to call the clinic with any problems, questions or concerns.  I spent 25 minutes counseling the patient face to face. The total time spent in the appointment was 30 minutes and more than 50% was on counseling.  This document serves as a record of services personally performed by Truitt Merle, MD. It was created on her behalf by Arlyce Harman, a trained medical scribe. The creation of this record is based on the scribe's personal observations and the provider's statements to them. This document has been checked and approved by the attending provider.     Truitt Merle, MD 07/18/2016

## 2016-07-18 ENCOUNTER — Encounter: Payer: Self-pay | Admitting: *Deleted

## 2016-07-18 ENCOUNTER — Ambulatory Visit (HOSPITAL_BASED_OUTPATIENT_CLINIC_OR_DEPARTMENT_OTHER): Payer: BLUE CROSS/BLUE SHIELD | Admitting: Hematology

## 2016-07-18 ENCOUNTER — Ambulatory Visit (HOSPITAL_BASED_OUTPATIENT_CLINIC_OR_DEPARTMENT_OTHER): Payer: BLUE CROSS/BLUE SHIELD

## 2016-07-18 ENCOUNTER — Other Ambulatory Visit (HOSPITAL_BASED_OUTPATIENT_CLINIC_OR_DEPARTMENT_OTHER): Payer: BLUE CROSS/BLUE SHIELD

## 2016-07-18 VITALS — BP 129/83 | HR 99 | Temp 99.0°F | Resp 16

## 2016-07-18 DIAGNOSIS — E46 Unspecified protein-calorie malnutrition: Secondary | ICD-10-CM

## 2016-07-18 DIAGNOSIS — Z5111 Encounter for antineoplastic chemotherapy: Secondary | ICD-10-CM | POA: Diagnosis not present

## 2016-07-18 DIAGNOSIS — E119 Type 2 diabetes mellitus without complications: Secondary | ICD-10-CM

## 2016-07-18 DIAGNOSIS — C250 Malignant neoplasm of head of pancreas: Secondary | ICD-10-CM

## 2016-07-18 DIAGNOSIS — C259 Malignant neoplasm of pancreas, unspecified: Secondary | ICD-10-CM

## 2016-07-18 LAB — COMPREHENSIVE METABOLIC PANEL
ALBUMIN: 3.4 g/dL — AB (ref 3.5–5.0)
ALK PHOS: 116 U/L (ref 40–150)
ALT: 19 U/L (ref 0–55)
ANION GAP: 8 meq/L (ref 3–11)
AST: 14 U/L (ref 5–34)
BUN: 5.9 mg/dL — ABNORMAL LOW (ref 7.0–26.0)
CALCIUM: 9.6 mg/dL (ref 8.4–10.4)
CHLORIDE: 105 meq/L (ref 98–109)
CO2: 28 mEq/L (ref 22–29)
CREATININE: 0.9 mg/dL (ref 0.7–1.3)
EGFR: >90 mL/min/{1.73_m2} (ref >90)
Glucose: 110 mg/dl (ref 70–140)
Potassium: 3.4 mEq/L — ABNORMAL LOW (ref 3.5–5.1)
Sodium: 141 mEq/L (ref 136–145)
Total Bilirubin: 0.34 mg/dL (ref 0.20–1.20)
Total Protein: 7 g/dL (ref 6.4–8.3)

## 2016-07-18 LAB — CBC WITH DIFFERENTIAL/PLATELET
BASO%: 0.8 % (ref 0.0–2.0)
BASOS ABS: 0 10*3/uL (ref 0.0–0.1)
EOS%: 0.7 % (ref 0.0–7.0)
Eosinophils Absolute: 0 10*3/uL (ref 0.0–0.5)
HEMATOCRIT: 38.5 % (ref 38.4–49.9)
HGB: 12.1 g/dL — ABNORMAL LOW (ref 13.0–17.1)
LYMPH#: 1.4 10*3/uL (ref 0.9–3.3)
LYMPH%: 22.2 % (ref 14.0–49.0)
MCH: 25.1 pg — AB (ref 27.2–33.4)
MCHC: 31.4 g/dL — AB (ref 32.0–36.0)
MCV: 80.2 fL (ref 79.3–98.0)
MONO#: 0.6 10*3/uL (ref 0.1–0.9)
MONO%: 10 % (ref 0.0–14.0)
NEUT#: 4.1 10*3/uL (ref 1.5–6.5)
NEUT%: 66.3 % (ref 39.0–75.0)
PLATELETS: 313 10*3/uL (ref 140–400)
RBC: 4.8 10*6/uL (ref 4.20–5.82)
RDW: 16.1 % — ABNORMAL HIGH (ref 11.0–14.6)
WBC: 6.1 10*3/uL (ref 4.0–10.3)

## 2016-07-18 MED ORDER — SODIUM CHLORIDE 0.9% FLUSH
10.0000 mL | INTRAVENOUS | Status: DC | PRN
  Administered 2016-07-18: 10 mL
  Filled 2016-07-18: qty 10

## 2016-07-18 MED ORDER — SODIUM CHLORIDE 0.9 % IV SOLN
Freq: Once | INTRAVENOUS | Status: AC
  Administered 2016-07-18: 14:00:00 via INTRAVENOUS

## 2016-07-18 MED ORDER — GEMCITABINE HCL CHEMO INJECTION 1 GM/26.3ML
1000.0000 mg/m2 | Freq: Once | INTRAVENOUS | Status: AC
  Administered 2016-07-18: 2090 mg via INTRAVENOUS
  Filled 2016-07-18: qty 54.97

## 2016-07-18 MED ORDER — PROCHLORPERAZINE MALEATE 10 MG PO TABS
10.0000 mg | ORAL_TABLET | Freq: Once | ORAL | Status: AC
  Administered 2016-07-18: 10 mg via ORAL

## 2016-07-18 MED ORDER — HEPARIN SOD (PORK) LOCK FLUSH 100 UNIT/ML IV SOLN
500.0000 [IU] | Freq: Once | INTRAVENOUS | Status: AC | PRN
  Administered 2016-07-18: 500 [IU]
  Filled 2016-07-18: qty 5

## 2016-07-18 MED ORDER — PROCHLORPERAZINE MALEATE 10 MG PO TABS
ORAL_TABLET | ORAL | Status: AC
  Filled 2016-07-18: qty 1

## 2016-07-18 MED ORDER — PACLITAXEL PROTEIN-BOUND CHEMO INJECTION 100 MG
125.0000 mg/m2 | Freq: Once | INTRAVENOUS | Status: AC
  Administered 2016-07-18: 275 mg via INTRAVENOUS
  Filled 2016-07-18: qty 35

## 2016-07-18 NOTE — Patient Instructions (Signed)
[facility] Discharge Instructions for Patients Receiving Chemotherapy  Today you received the following chemotherapy agents: Abraxane and Gemzar   To help prevent nausea and vomiting after your treatment, we encourage you to take your nausea medication as directed.    If you develop nausea and vomiting that is not controlled by your nausea medication, call the clinic.   BELOW ARE SYMPTOMS THAT SHOULD BE REPORTED IMMEDIATELY:  *FEVER GREATER THAN 100.5 F  *CHILLS WITH OR WITHOUT FEVER  NAUSEA AND VOMITING THAT IS NOT CONTROLLED WITH YOUR NAUSEA MEDICATION  *UNUSUAL SHORTNESS OF BREATH  *UNUSUAL BRUISING OR BLEEDING  TENDERNESS IN MOUTH AND THROAT WITH OR WITHOUT PRESENCE OF ULCERS  *URINARY PROBLEMS  *BOWEL PROBLEMS  UNUSUAL RASH Items with * indicate a potential emergency and should be followed up as soon as possible.  Feel free to call the clinic you have any questions or concerns. The clinic phone number is [phone].  Please show the CHEMO ALERT CARD at check-in to the Emergency Department and triage nurse.   

## 2016-07-18 NOTE — Progress Notes (Signed)
Oncology Nurse Navigator Documentation  Oncology Nurse Navigator Flowsheets 07/18/2016  Navigator Location [facility]  Referral date to RadOnc/MedOnc -  Navigator Encounter Type Treatment  Telephone -  Abnormal Finding Date -  Confirmed Diagnosis Date -  Patient Visit Type MedOnc  Treatment Phase Active Tx--Gemzar/Abraxane  Barriers/Navigation Needs Education  Education Other--Gemzar/Abraxane  Interventions Education--stressed he needs to push high protein and high calorie diet and exercise to prepare for his surgery.  Coordination of Care -  Education Method Verbal;Teach-back  Support Groups/Services -  Acuity Level 1  Time Spent with Patient 15

## 2016-07-19 ENCOUNTER — Encounter: Payer: Self-pay | Admitting: Hematology

## 2016-07-19 LAB — CANCER ANTIGEN 19-9: CA 19-9: 61 U/mL — ABNORMAL HIGH (ref 0–35)

## 2016-07-22 ENCOUNTER — Telehealth: Payer: Self-pay | Admitting: *Deleted

## 2016-07-22 NOTE — Telephone Encounter (Signed)
Received call yesterday from pt's wife stating pt was very fatigued & she was concerned if he got the same chemo as before.  Informed that he did & same dose.  Returned call to pt & he reports no energy, weak & after taking pancreatic pill he feels full & can't eat as much.  He states he hasn't had a BM since chemo but is passing gas.  Discussed diet & increased fluids & may need to take laxative/stool softner.  He has an appt fri for lab & RX & if needs to be seen, we will work that out.  Called pt today & he states his strength is coming back & he feels better today.

## 2016-07-25 ENCOUNTER — Ambulatory Visit (HOSPITAL_BASED_OUTPATIENT_CLINIC_OR_DEPARTMENT_OTHER): Payer: BLUE CROSS/BLUE SHIELD

## 2016-07-25 ENCOUNTER — Other Ambulatory Visit (HOSPITAL_BASED_OUTPATIENT_CLINIC_OR_DEPARTMENT_OTHER): Payer: BLUE CROSS/BLUE SHIELD

## 2016-07-25 VITALS — BP 142/94 | HR 107 | Temp 98.6°F | Resp 16

## 2016-07-25 DIAGNOSIS — C259 Malignant neoplasm of pancreas, unspecified: Secondary | ICD-10-CM

## 2016-07-25 DIAGNOSIS — Z5111 Encounter for antineoplastic chemotherapy: Secondary | ICD-10-CM

## 2016-07-25 LAB — COMPREHENSIVE METABOLIC PANEL
ALT: 19 U/L (ref 0–55)
ANION GAP: 9 meq/L (ref 3–11)
AST: 15 U/L (ref 5–34)
Albumin: 3.2 g/dL — ABNORMAL LOW (ref 3.5–5.0)
Alkaline Phosphatase: 108 U/L (ref 40–150)
BUN: 7.9 mg/dL (ref 7.0–26.0)
CALCIUM: 9.3 mg/dL (ref 8.4–10.4)
CHLORIDE: 102 meq/L (ref 98–109)
CO2: 27 mEq/L (ref 22–29)
Creatinine: 1.1 mg/dL (ref 0.7–1.3)
EGFR: 82 mL/min/{1.73_m2} — ABNORMAL LOW (ref >90)
Glucose: 336 mg/dl — ABNORMAL HIGH (ref 70–140)
POTASSIUM: 3.5 meq/L (ref 3.5–5.1)
Sodium: 137 mEq/L (ref 136–145)
Total Bilirubin: 0.25 mg/dL (ref 0.20–1.20)
Total Protein: 6.8 g/dL (ref 6.4–8.3)

## 2016-07-25 LAB — CBC WITH DIFFERENTIAL/PLATELET
BASO%: 0.7 % (ref 0.0–2.0)
BASOS ABS: 0 10*3/uL (ref 0.0–0.1)
EOS%: 2.4 % (ref 0.0–7.0)
Eosinophils Absolute: 0.1 10*3/uL (ref 0.0–0.5)
HEMATOCRIT: 36.6 % — AB (ref 38.4–49.9)
HGB: 11.4 g/dL — ABNORMAL LOW (ref 13.0–17.1)
LYMPH#: 0.9 10*3/uL (ref 0.9–3.3)
LYMPH%: 31.3 % (ref 14.0–49.0)
MCH: 24.6 pg — AB (ref 27.2–33.4)
MCHC: 31.2 g/dL — AB (ref 32.0–36.0)
MCV: 78.9 fL — ABNORMAL LOW (ref 79.3–98.0)
MONO#: 0.2 10*3/uL (ref 0.1–0.9)
MONO%: 7.8 % (ref 0.0–14.0)
NEUT#: 1.7 10*3/uL (ref 1.5–6.5)
NEUT%: 57.8 % (ref 39.0–75.0)
PLATELETS: 139 10*3/uL — AB (ref 140–400)
RBC: 4.64 10*6/uL (ref 4.20–5.82)
RDW: 15.1 % — ABNORMAL HIGH (ref 11.0–14.6)
WBC: 2.9 10*3/uL — ABNORMAL LOW (ref 4.0–10.3)

## 2016-07-25 MED ORDER — PROCHLORPERAZINE MALEATE 10 MG PO TABS
ORAL_TABLET | ORAL | Status: AC
  Filled 2016-07-25: qty 1

## 2016-07-25 MED ORDER — SODIUM CHLORIDE 0.9% FLUSH
10.0000 mL | INTRAVENOUS | Status: DC | PRN
  Administered 2016-07-25: 10 mL
  Filled 2016-07-25: qty 10

## 2016-07-25 MED ORDER — PROCHLORPERAZINE MALEATE 10 MG PO TABS
10.0000 mg | ORAL_TABLET | Freq: Once | ORAL | Status: AC
  Administered 2016-07-25: 10 mg via ORAL

## 2016-07-25 MED ORDER — HEPARIN SOD (PORK) LOCK FLUSH 100 UNIT/ML IV SOLN
500.0000 [IU] | Freq: Once | INTRAVENOUS | Status: AC | PRN
  Administered 2016-07-25: 500 [IU]
  Filled 2016-07-25: qty 5

## 2016-07-25 MED ORDER — PACLITAXEL PROTEIN-BOUND CHEMO INJECTION 100 MG
125.0000 mg/m2 | Freq: Once | INTRAVENOUS | Status: AC
  Administered 2016-07-25: 275 mg via INTRAVENOUS
  Filled 2016-07-25: qty 55

## 2016-07-25 MED ORDER — SODIUM CHLORIDE 0.9 % IV SOLN
Freq: Once | INTRAVENOUS | Status: AC
  Administered 2016-07-25: 15:00:00 via INTRAVENOUS

## 2016-07-25 MED ORDER — SODIUM CHLORIDE 0.9 % IV SOLN
1000.0000 mg/m2 | Freq: Once | INTRAVENOUS | Status: AC
  Administered 2016-07-25: 2090 mg via INTRAVENOUS
  Filled 2016-07-25: qty 54.97

## 2016-07-25 NOTE — Patient Instructions (Signed)
[facility] Discharge Instructions for Patients Receiving Chemotherapy  Today you received the following chemotherapy agents: Abraxane and Gemzar   To help prevent nausea and vomiting after your treatment, we encourage you to take your nausea medication as directed.    If you develop nausea and vomiting that is not controlled by your nausea medication, call the clinic.   BELOW ARE SYMPTOMS THAT SHOULD BE REPORTED IMMEDIATELY:  *FEVER GREATER THAN 100.5 F  *CHILLS WITH OR WITHOUT FEVER  NAUSEA AND VOMITING THAT IS NOT CONTROLLED WITH YOUR NAUSEA MEDICATION  *UNUSUAL SHORTNESS OF BREATH  *UNUSUAL BRUISING OR BLEEDING  TENDERNESS IN MOUTH AND THROAT WITH OR WITHOUT PRESENCE OF ULCERS  *URINARY PROBLEMS  *BOWEL PROBLEMS  UNUSUAL RASH Items with * indicate a potential emergency and should be followed up as soon as possible.  Feel free to call the clinic you have any questions or concerns. The clinic phone number is [phone].  Please show the CHEMO ALERT CARD at check-in to the Emergency Department and triage nurse.   

## 2016-08-14 NOTE — Progress Notes (Signed)
07/18/16 LOV Dr Jolayne Panther oncology in epic 06/05/16 CT chest in epic 03/12/2016 ekg epic

## 2016-08-14 NOTE — Patient Instructions (Addendum)
GARRIK FRANZESE  08/14/2016   Your procedure is scheduled on: 08/21/2016  Report to [facility]  Entrance take [place]  elevators to 3rd floor to  [place] at [phone].  Call this number if you have problems the morning of surgery [phone]   Remember: ONLY 1 PERSON MAY GO WITH YOU TO SHORT STAY TO GET  READY MORNING OF [place].  Do not eat food or drink liquids :After Midnight.     Take these medicines the morning of surgery with A SIP OF WATER: NONE  USE FLEETS ENEMA PER DR Hagerman SURGERY 08/20/2016.                               You may not have any metal on your body including hair pins and              piercings  Do not wear jewelry, make-up, lotions, powders or perfumes, deodorant             Do not wear nail polish.  Do not shave  48 hours prior to surgery.              Men may shave face and neck.   Do not bring valuables to the hospital. [place].  Contacts, dentures or bridgework may not be worn into surgery.  Leave suitcase in the car. After surgery it may be brought to your room.                  Please read over the following fact sheets you were given: _____________________________________________________________________             [facility] - Preparing for Surgery Before surgery, you can play an important role.  Because skin is not sterile, your skin needs to be as free of germs as possible.  You can reduce the number of germs on your skin by washing with CHG (chlorahexidine gluconate) soap before surgery.  CHG is an antiseptic cleaner which kills germs and bonds with the skin to continue killing germs even after washing. Please DO NOT use if you have an allergy to CHG or antibacterial soaps.  If your skin becomes reddened/irritated stop using the CHG and inform your nurse when you arrive at Short Stay. Do not shave (including legs and underarms) for at least 48  hours prior to the first CHG shower.  You may shave your face/neck. Please follow these instructions carefully:  1.  Shower with CHG Soap the night before surgery and the  morning of Surgery.  2.  If you choose to wash your hair, wash your hair first as usual with your  normal  shampoo.  3.  After you shampoo, rinse your hair and body thoroughly to remove the  shampoo.                           4.  Use CHG as you would any other liquid soap.  You can apply chg directly  to the skin and wash                       Gently with a scrungie or  clean washcloth.  5.  Apply the CHG Soap to your body ONLY FROM THE NECK DOWN.   Do not use on face/ open                           Wound or open sores. Avoid contact with eyes, ears mouth and genitals (private parts).                       Wash face,  Genitals (private parts) with your normal soap.             6.  Wash thoroughly, paying special attention to the area where your surgery  will be performed.  7.  Thoroughly rinse your body with warm water from the neck down.  8.  DO NOT shower/wash with your normal soap after using and rinsing off  the CHG Soap.                9.  Pat yourself dry with a clean towel.            10.  Wear clean pajamas.            11.  Place clean sheets on your bed the night of your first shower and do not  sleep with pets. Day of Surgery : Do not apply any lotions/deodorants the morning of surgery.  Please wear clean clothes to the hospital/surgery center.  Why is it important to control my blood sugar before and after surgery? . Improving blood sugar levels before and after surgery helps healing and can limit problems. . A way of improving blood sugar control is eating a healthy diet by: o  Eating less sugar and carbohydrates o  Increasing activity/exercise o  Talking with your doctor about reaching your blood sugar goals . High blood sugars (greater than 180 mg/dL) can raise your risk of infections and slow your recovery, so  you will need to focus on controlling your diabetes during the weeks before surgery. . Make sure that the doctor who takes care of your diabetes knows about your planned surgery including the date and location.  How do I manage my blood sugar before surgery? . Check your blood sugar at least 4 times a day, starting 2 days before surgery, to make sure that the level is not too high or low. o Check your blood sugar the morning of your surgery when you wake up and every 2 hours until you get to the Short Stay unit. . If your blood sugar is less than 70 mg/dL, you will need to treat for low blood sugar: o Do not take insulin. o Treat a low blood sugar (less than 70 mg/dL) with  cup of clear juice (cranberry or apple), 4 glucose tablets, OR glucose gel. o Recheck blood sugar in 15 minutes after treatment (to make sure it is greater than 70 mg/dL). If your blood sugar is not greater than 70 mg/dL on recheck, call [phone] for further instructions. . Report your blood sugar to the short stay nurse when you get to Short Stay.  . If you are admitted to the hospital after surgery: o Your blood sugar will be checked by the staff and you will probably be given insulin after surgery (instead of oral diabetes medicines) to make sure you have good blood sugar levels. o The goal for blood sugar control after surgery is 80-180 mg/dL.   WHAT DO I  DO ABOUT MY DIABETES MEDICATION? Tka Lantus insulin as normal morning before surgery 08-20-16  . THE MORNING OF SURGERY, take11.5 units ofLantus insulin.   Patient Signature:  Date:   Nurse Signature:  Date:   Reviewed and Endorsed by [facility] Patient Education Committee, August 2015

## 2016-08-18 ENCOUNTER — Encounter (HOSPITAL_COMMUNITY)
Admission: RE | Admit: 2016-08-18 | Discharge: 2016-08-18 | Disposition: A | Discharge status: Home / Self Care | Payer: BLUE CROSS/BLUE SHIELD | Source: Ambulatory Visit | Attending: General Surgery | Admitting: General Surgery

## 2016-08-18 ENCOUNTER — Encounter (HOSPITAL_COMMUNITY): Payer: Self-pay

## 2016-08-18 DIAGNOSIS — Z01812 Encounter for preprocedural laboratory examination: Secondary | ICD-10-CM | POA: Insufficient documentation

## 2016-08-18 LAB — CBC WITH DIFFERENTIAL/PLATELET
Basophils Absolute: 0 10*3/uL (ref 0.0–0.1)
Basophils Relative: 0 %
EOS PCT: 2 %
Eosinophils Absolute: 0.1 10*3/uL (ref 0.0–0.7)
HEMATOCRIT: 37.7 % — AB (ref 39.0–52.0)
Hemoglobin: 12.3 g/dL — ABNORMAL LOW (ref 13.0–17.0)
LYMPHS ABS: 1.5 10*3/uL (ref 0.7–4.0)
LYMPHS PCT: 29 %
MCH: 25.6 pg — AB (ref 26.0–34.0)
MCHC: 32.6 g/dL (ref 30.0–36.0)
MCV: 78.4 fL (ref 78.0–100.0)
MONO ABS: 0.6 10*3/uL (ref 0.1–1.0)
MONOS PCT: 10 %
NEUTROS ABS: 3.1 10*3/uL (ref 1.7–7.7)
Neutrophils Relative %: 59 %
PLATELETS: 364 10*3/uL (ref 150–400)
RBC: 4.81 MIL/uL (ref 4.22–5.81)
RDW: 15.3 % (ref 11.5–15.5)
WBC: 5.3 10*3/uL (ref 4.0–10.5)

## 2016-08-18 LAB — COMPREHENSIVE METABOLIC PANEL
ALT: 20 U/L (ref 17–63)
AST: 18 U/L (ref 15–41)
Albumin: 3.8 g/dL (ref 3.5–5.0)
Alkaline Phosphatase: 88 U/L (ref 38–126)
Anion gap: 8 (ref 5–15)
BILIRUBIN TOTAL: 0.6 mg/dL (ref 0.3–1.2)
BUN: 7 mg/dL (ref 6–20)
CHLORIDE: 103 mmol/L (ref 101–111)
CO2: 28 mmol/L (ref 22–32)
Calcium: 9.4 mg/dL (ref 8.9–10.3)
Creatinine, Ser: 0.8 mg/dL (ref 0.61–1.24)
Glucose, Bld: 75 mg/dL (ref 65–99)
POTASSIUM: 3.3 mmol/L — AB (ref 3.5–5.1)
Sodium: 139 mmol/L (ref 135–145)
TOTAL PROTEIN: 6.6 g/dL (ref 6.5–8.1)

## 2016-08-18 LAB — URINALYSIS, ROUTINE W REFLEX MICROSCOPIC
BILIRUBIN URINE: NEGATIVE
GLUCOSE, UA: NEGATIVE mg/dL
Hgb urine dipstick: NEGATIVE
KETONES UR: NEGATIVE mg/dL
Leukocytes, UA: NEGATIVE
NITRITE: NEGATIVE
PH: 5 (ref 5.0–8.0)
PROTEIN: 100 mg/dL — AB
Specific Gravity, Urine: 1.026 (ref 1.005–1.030)

## 2016-08-18 LAB — GLUCOSE, CAPILLARY: Glucose-Capillary: 106 mg/dL — ABNORMAL HIGH (ref 65–99)

## 2016-08-18 LAB — PREPARE RBC (CROSSMATCH)

## 2016-08-18 LAB — PREALBUMIN: Prealbumin: 17.1 mg/dL — ABNORMAL LOW (ref 18–38)

## 2016-08-18 LAB — PROTIME-INR
INR: 1.07
PROTHROMBIN TIME: 13.9 s (ref 11.4–15.2)

## 2016-08-18 LAB — ABO/RH: ABO/RH(D): B POS

## 2016-08-18 NOTE — H&P (Signed)
Berdie Ogren  Location: [facility] Surgery Patient #: [id number] DOB: 05-12-1956 Married / Language: English / Race: Black or African American Male   History of Present Illness  The patient is a 61 year old male who presents for a follow-up for Pancreatic cancer. Patient was diagnosed with pancreatic cancer in August 2017. Presenting symptoms were jaundice, pain, and weight loss. He has been getting neoadjuvant treatment and is here to discuss possible surgery. Follow up imaging was favorable. He has had a difficult time with chemotherapy. He has had significant fatigue and weakness, but is still able to get around the house.   Follow up CT 06/05/16  Pancreas: Stent now seen within the distal common bile duct. Previously seen low-attenuation mass in the pancreatic head and uncinate process is less conspicuous than on previous study. This currently measures approximately 2.0 x 3.0 cm in maximum dimensions on image 47/2, compared with 3.2 x 3.5 cm when measured at same level on previous study. This mass abuts the posterior margin of the superior mesenteric vein on image 164/10 but does not encase the SMV. This mass shows no other vascular involvement. Increased atrophy of the remainder of the pancreas is demonstrated.  IMPRESSION: Mildly decreased size of mass within pancreatic head and uncinate process, with common bile duct stent in appropriate position.  No evidence of metastatic disease or other acute findings within the chest, abdomen, or pelvis.    Prior history [place] is a 61 year old male who is referred by Dr. Benson Norway with new diagnosis of adenocarcinoma of the pancreatic head. He had not been feeling well and has been losing weight over the last few months. His primary doctor had been following labs. When his liver function tests were elevated, he was sent directly to the hospital. He underwent ERCP with metal stent placement and endoscopic ultrasound. Is found to  have a 3.1 cm mass in the head/uncinate process. There is preserved fat plane between all the vessels. It did appear to potentially invade her about the duodenal mucosa. EUS appeared to have the lesion more at 3.9 mm. There was no suggestion of lymphatic involvement on any of his studies. He was drinking around a sixpack of beer per day until around 3 days prior to admission to the hospital. He has not been drinking since then. He smoked around 20 years ago but has not smoked for quite a long time. His CA-19-9 was 158. Weight loss was around 30-40 pounds. He was diagnosed with diabetes in 2010. He was first on pills for this but was placed on insulin around 2 years ago. He has no family history of any cancers other than skin cancer in father.]     Allergies  No Known Drug Allergies   Medication History  Folic Acid (1MG  Tablet, Oral) Active. Lantus (100UNIT/ML Solution, Subcutaneous) Active. (22 units per day) EMLA (2.5-2.5% Cream, External) Active. Aleve (220MG  Tablet, Oral) Active. Zenpep (Oral) Specific dose unknown - Active. K-[facility] Tablet ER, Oral) Active. Medications Reconciled    Review of Systems  All other systems negative  Vitals  Weight: 174.8 lb Height: 70in Body Surface Area: 1.97 m Body Mass Index: 25.08 kg/m  Temp.: 98.76F  Pulse: 104 (Regular)  BP: 122/90 (Sitting, Left Arm, Standard)       Physical Exam General Mental Status-Alert. General Appearance-Consistent with stated age. Hydration-Well hydrated. Voice-Normal.  Head and Neck Head-normocephalic, atraumatic with no lesions or palpable masses.  Eye Sclera/Conjunctiva - Bilateral-No scleral icterus.  Chest and Lung Exam  Chest and lung exam reveals -quiet, even and easy respiratory effort with no use of accessory muscles. Inspection Chest Wall - Normal. Back - normal.  Breast - Did not examine.  Cardiovascular Cardiovascular examination reveals -normal  pedal pulses bilaterally. Note: regular rate and rhythm  Abdomen Inspection-Inspection Normal. Palpation/Percussion Palpation and Percussion of the abdomen reveal - Soft, Non Tender, No Rebound tenderness, No Rigidity (guarding) and No hepatosplenomegaly.  Peripheral Vascular Upper Extremity Inspection - Bilateral - Normal - No Clubbing, No Cyanosis, No Edema, Pulses Intact. Lower Extremity Palpation - Edema - Bilateral - No edema.  Neurologic Neurologic evaluation reveals -alert and oriented x 3 with no impairment of recent or remote memory. Mental Status-Normal.  Musculoskeletal Global Assessment -Note: no gross deformities.  Normal Exam - Left-Upper Extremity Strength Normal and Lower Extremity Strength Normal. Normal Exam - Right-Upper Extremity Strength Normal and Lower Extremity Strength Normal.  Lymphatic Head & Neck  General Head & Neck Lymphatics: Bilateral - Description - Normal. Axillary  General Axillary Region: Bilateral - Description - Normal. Tenderness - Non Tender.    Assessment & Plan ADENOCARCINOMA OF HEAD OF PANCREAS (C25.0) Impression: The patient is very undecided about surgery. He is understandably, quite apprehensive. I reviewed with him diagrams of anatomy and what happens with Whipple and possible risks of surgery. I also reviewed the natural history of pancreatic cancer that is not treated with surgery. I discussed risk of recurrence after surgery and risk of growth and metastasis if he were to continue chemotherapy or try stereotactic radiation.  The patient would like to have some time to consider his choices. He is instructed to call our office to let us know his decision. It would take several weeks to get surgery scheduled. If it is going to be more than a few weeks, he will have an additional round of chemotherapy.  Over 30 minutes were spent discussing his treatment options. I reassured him that this is his decision. I reminded  him that we cannot guarantee that surgery will not be followed by recurrence. I discussed that in general the majority of long-term survivors have had surgery and that is what our goal is for him. Current Plans Pt Education - flb whipple pt info   Signed by Stark Klein, MD

## 2016-08-19 LAB — HEMOGLOBIN A1C
Hgb A1c MFr Bld: 10.7 % — ABNORMAL HIGH (ref 4.8–5.6)
MEAN PLASMA GLUCOSE: 260 mg/dL

## 2016-08-19 NOTE — Progress Notes (Signed)
HgA1C results routed via EPIC to Dr Stark Klein

## 2016-08-21 ENCOUNTER — Inpatient Hospital Stay (HOSPITAL_COMMUNITY): Payer: BLUE CROSS/BLUE SHIELD | Admitting: Anesthesiology

## 2016-08-21 ENCOUNTER — Encounter (HOSPITAL_COMMUNITY): Payer: Self-pay | Admitting: *Deleted

## 2016-08-21 ENCOUNTER — Inpatient Hospital Stay (HOSPITAL_COMMUNITY)
Admission: RE | Admit: 2016-08-21 | Discharge: 2016-09-04 | DRG: 405 | Disposition: A | Discharge status: Home Health | Payer: BLUE CROSS/BLUE SHIELD | Source: Ambulatory Visit | Attending: General Surgery | Admitting: General Surgery

## 2016-08-21 ENCOUNTER — Inpatient Hospital Stay (HOSPITAL_COMMUNITY): Payer: BLUE CROSS/BLUE SHIELD

## 2016-08-21 ENCOUNTER — Telehealth: Payer: Self-pay | Admitting: Hematology

## 2016-08-21 ENCOUNTER — Encounter (HOSPITAL_COMMUNITY)
Admission: RE | Disposition: A | Discharge status: Home Health | Payer: Self-pay | Source: Ambulatory Visit | Attending: General Surgery

## 2016-08-21 ENCOUNTER — Encounter: Payer: Self-pay | Admitting: *Deleted

## 2016-08-21 DIAGNOSIS — K5900 Constipation, unspecified: Secondary | ICD-10-CM | POA: Diagnosis present

## 2016-08-21 DIAGNOSIS — Z87891 Personal history of nicotine dependence: Secondary | ICD-10-CM | POA: Diagnosis not present

## 2016-08-21 DIAGNOSIS — D62 Acute posthemorrhagic anemia: Secondary | ICD-10-CM | POA: Diagnosis present

## 2016-08-21 DIAGNOSIS — Z79899 Other long term (current) drug therapy: Secondary | ICD-10-CM

## 2016-08-21 DIAGNOSIS — Z791 Long term (current) use of non-steroidal anti-inflammatories (NSAID): Secondary | ICD-10-CM

## 2016-08-21 DIAGNOSIS — I10 Essential (primary) hypertension: Secondary | ICD-10-CM | POA: Diagnosis present

## 2016-08-21 DIAGNOSIS — Z794 Long term (current) use of insulin: Secondary | ICD-10-CM | POA: Diagnosis not present

## 2016-08-21 DIAGNOSIS — E43 Unspecified severe protein-calorie malnutrition: Secondary | ICD-10-CM | POA: Diagnosis present

## 2016-08-21 DIAGNOSIS — C250 Malignant neoplasm of head of pancreas: Principal | ICD-10-CM | POA: Diagnosis present

## 2016-08-21 DIAGNOSIS — D630 Anemia in neoplastic disease: Secondary | ICD-10-CM | POA: Diagnosis present

## 2016-08-21 DIAGNOSIS — Z6825 Body mass index (BMI) 25.0-25.9, adult: Secondary | ICD-10-CM

## 2016-08-21 DIAGNOSIS — K3184 Gastroparesis: Secondary | ICD-10-CM | POA: Diagnosis present

## 2016-08-21 DIAGNOSIS — Z419 Encounter for procedure for purposes other than remedying health state, unspecified: Secondary | ICD-10-CM

## 2016-08-21 DIAGNOSIS — Z79891 Long term (current) use of opiate analgesic: Secondary | ICD-10-CM

## 2016-08-21 DIAGNOSIS — R000 Tachycardia, unspecified: Secondary | ICD-10-CM | POA: Diagnosis not present

## 2016-08-21 DIAGNOSIS — E1143 Type 2 diabetes mellitus with diabetic autonomic (poly)neuropathy: Secondary | ICD-10-CM | POA: Diagnosis present

## 2016-08-21 DIAGNOSIS — Z8659 Personal history of other mental and behavioral disorders: Secondary | ICD-10-CM | POA: Diagnosis not present

## 2016-08-21 DIAGNOSIS — C259 Malignant neoplasm of pancreas, unspecified: Secondary | ICD-10-CM

## 2016-08-21 DIAGNOSIS — G8918 Other acute postprocedural pain: Secondary | ICD-10-CM

## 2016-08-21 HISTORY — PX: WHIPPLE PROCEDURE: SHX2667

## 2016-08-21 HISTORY — PX: LAPAROSCOPY: SHX197

## 2016-08-21 LAB — CBC
HEMATOCRIT: 31 % — AB (ref 39.0–52.0)
Hemoglobin: 10.2 g/dL — ABNORMAL LOW (ref 13.0–17.0)
MCH: 25.8 pg — ABNORMAL LOW (ref 26.0–34.0)
MCHC: 32.9 g/dL (ref 30.0–36.0)
MCV: 78.3 fL (ref 78.0–100.0)
Platelets: 232 10*3/uL (ref 150–400)
RBC: 3.96 MIL/uL — AB (ref 4.22–5.81)
RDW: 15.3 % (ref 11.5–15.5)
WBC: 16.3 10*3/uL — AB (ref 4.0–10.5)

## 2016-08-21 LAB — GLUCOSE, CAPILLARY
GLUCOSE-CAPILLARY: 120 mg/dL — AB (ref 65–99)
GLUCOSE-CAPILLARY: 263 mg/dL — AB (ref 65–99)
Glucose-Capillary: 162 mg/dL — ABNORMAL HIGH (ref 65–99)

## 2016-08-21 LAB — POCT I-STAT 7, (LYTES, BLD GAS, ICA,H+H)
Acid-Base Excess: 1 mmol/L (ref 0.0–2.0)
Bicarbonate: 24.6 mmol/L (ref 20.0–28.0)
Calcium, Ion: 1.16 mmol/L (ref 1.15–1.40)
HEMATOCRIT: 28 % — AB (ref 39.0–52.0)
HEMOGLOBIN: 9.5 g/dL — AB (ref 13.0–17.0)
O2 SAT: 100 %
PCO2 ART: 34.4 mmHg (ref 32.0–48.0)
PO2 ART: 357 mmHg — AB (ref 83.0–108.0)
POTASSIUM: 3 mmol/L — AB (ref 3.5–5.1)
Sodium: 139 mmol/L (ref 135–145)
TCO2: 26 mmol/L (ref 0–100)
pH, Arterial: 7.462 — ABNORMAL HIGH (ref 7.350–7.450)

## 2016-08-21 LAB — CREATININE, SERUM: CREATININE: 0.74 mg/dL (ref 0.61–1.24)

## 2016-08-21 LAB — MRSA PCR SCREENING: MRSA BY PCR: NEGATIVE

## 2016-08-21 SURGERY — LAPAROSCOPY, DIAGNOSTIC
Anesthesia: General | Site: Abdomen

## 2016-08-21 MED ORDER — KCL IN DEXTROSE-NACL 20-5-0.45 MEQ/L-%-% IV SOLN
INTRAVENOUS | Status: AC
  Administered 2016-08-21: 1000 mL via INTRAVENOUS
  Filled 2016-08-21: qty 1000

## 2016-08-21 MED ORDER — CEFAZOLIN SODIUM-DEXTROSE 2-4 GM/100ML-% IV SOLN
INTRAVENOUS | Status: AC
  Filled 2016-08-21: qty 100

## 2016-08-21 MED ORDER — NALOXONE HCL 0.4 MG/ML IJ SOLN: 0.4000 mg | INTRAMUSCULAR | Status: DC | PRN

## 2016-08-21 MED ORDER — SODIUM CHLORIDE 0.9% FLUSH: 9.0000 mL | INTRAVENOUS | Status: DC | PRN

## 2016-08-21 MED ORDER — INSULIN ASPART 100 UNIT/ML ~~LOC~~ SOLN
SUBCUTANEOUS | Status: AC
  Administered 2016-08-21: 8 [IU] via SUBCUTANEOUS
  Filled 2016-08-21: qty 1

## 2016-08-21 MED ORDER — ONDANSETRON HCL 4 MG/2ML IJ SOLN
4.0000 mg | Freq: Four times a day (QID) | INTRAMUSCULAR | Status: DC | PRN
  Filled 2016-08-21 (×6): qty 2

## 2016-08-21 MED ORDER — BUPIVACAINE HCL (PF) 0.25 % IJ SOLN
INTRAMUSCULAR | Status: AC
  Filled 2016-08-21: qty 30

## 2016-08-21 MED ORDER — CEFAZOLIN SODIUM-DEXTROSE 2-4 GM/100ML-% IV SOLN
2.0000 g | Freq: Three times a day (TID) | INTRAVENOUS | Status: AC
  Administered 2016-08-21: 2 g via INTRAVENOUS
  Filled 2016-08-21: qty 100

## 2016-08-21 MED ORDER — ROCURONIUM BROMIDE 10 MG/ML (PF) SYRINGE
PREFILLED_SYRINGE | INTRAVENOUS | Status: DC | PRN
  Administered 2016-08-21: 10 mg via INTRAVENOUS
  Administered 2016-08-21: 50 mg via INTRAVENOUS
  Administered 2016-08-21: 10 mg via INTRAVENOUS
  Administered 2016-08-21: 20 mg via INTRAVENOUS
  Administered 2016-08-21 (×8): 10 mg via INTRAVENOUS

## 2016-08-21 MED ORDER — INSULIN ASPART 100 UNIT/ML ~~LOC~~ SOLN
0.0000 [IU] | SUBCUTANEOUS | Status: DC
  Administered 2016-08-21: 3 [IU] via SUBCUTANEOUS
  Administered 2016-08-21: 8 [IU] via SUBCUTANEOUS
  Administered 2016-08-22: 2 [IU] via SUBCUTANEOUS
  Administered 2016-08-22: 3 [IU] via SUBCUTANEOUS
  Administered 2016-08-22: 2 [IU] via SUBCUTANEOUS
  Administered 2016-08-22: 5 [IU] via SUBCUTANEOUS
  Administered 2016-08-22: 2 [IU] via SUBCUTANEOUS
  Administered 2016-08-23: 1 [IU] via SUBCUTANEOUS
  Administered 2016-08-23 – 2016-08-24 (×4): 2 [IU] via SUBCUTANEOUS
  Administered 2016-08-25: 3 [IU] via SUBCUTANEOUS
  Administered 2016-08-25: 2 [IU] via SUBCUTANEOUS
  Administered 2016-08-26: 5 [IU] via SUBCUTANEOUS
  Administered 2016-08-26: 3 [IU] via SUBCUTANEOUS
  Administered 2016-08-26: 2 [IU] via SUBCUTANEOUS
  Administered 2016-08-27: 3 [IU] via SUBCUTANEOUS
  Administered 2016-08-27 (×3): 2 [IU] via SUBCUTANEOUS
  Administered 2016-08-28: 3 [IU] via SUBCUTANEOUS
  Administered 2016-08-28 – 2016-08-30 (×5): 2 [IU] via SUBCUTANEOUS
  Administered 2016-08-31 (×3): 3 [IU] via SUBCUTANEOUS
  Administered 2016-08-31: 2 [IU] via SUBCUTANEOUS
  Administered 2016-08-31: 3 [IU] via SUBCUTANEOUS
  Administered 2016-08-31: 2 [IU] via SUBCUTANEOUS
  Administered 2016-08-31: 3 [IU] via SUBCUTANEOUS
  Administered 2016-09-01: 2 [IU] via SUBCUTANEOUS
  Administered 2016-09-01 (×3): 3 [IU] via SUBCUTANEOUS
  Administered 2016-09-02 (×3): 2 [IU] via SUBCUTANEOUS
  Administered 2016-09-03: 3 [IU] via SUBCUTANEOUS
  Administered 2016-09-03 (×3): 2 [IU] via SUBCUTANEOUS
  Administered 2016-09-04: 5 [IU] via SUBCUTANEOUS
  Administered 2016-09-04: 3 [IU] via SUBCUTANEOUS

## 2016-08-21 MED ORDER — ROCURONIUM BROMIDE 50 MG/5ML IV SOSY
PREFILLED_SYRINGE | INTRAVENOUS | Status: AC
  Filled 2016-08-21: qty 5

## 2016-08-21 MED ORDER — HYDROMORPHONE HCL 1 MG/ML IJ SOLN
INTRAMUSCULAR | Status: AC
  Administered 2016-08-21: 0.5 mg via INTRAVENOUS
  Filled 2016-08-21: qty 1

## 2016-08-21 MED ORDER — MIDAZOLAM HCL 5 MG/5ML IJ SOLN
INTRAMUSCULAR | Status: DC | PRN
  Administered 2016-08-21: 2 mg via INTRAVENOUS

## 2016-08-21 MED ORDER — ESMOLOL HCL 100 MG/10ML IV SOLN
INTRAVENOUS | Status: AC
  Filled 2016-08-21: qty 10

## 2016-08-21 MED ORDER — ONDANSETRON HCL 4 MG/2ML IJ SOLN
INTRAMUSCULAR | Status: AC
  Filled 2016-08-21: qty 2

## 2016-08-21 MED ORDER — BUPIVACAINE 0.25 % ON-Q PUMP DUAL CATH 300 ML
300.0000 mL | INJECTION | Status: DC
  Administered 2016-08-21: 300 mL
  Filled 2016-08-21: qty 300

## 2016-08-21 MED ORDER — HYDROMORPHONE 1 MG/ML IV SOLN
INTRAVENOUS | Status: DC
  Administered 2016-08-21: 25 mg via INTRAVENOUS
  Administered 2016-08-22: 1.5 mg via INTRAVENOUS
  Administered 2016-08-22: 1.8 mg via INTRAVENOUS
  Administered 2016-08-22: 3 mg via INTRAVENOUS
  Administered 2016-08-23: 0.6 mg via INTRAVENOUS
  Filled 2016-08-21: qty 25

## 2016-08-21 MED ORDER — ONDANSETRON 4 MG PO TBDP: 4.0000 mg | ORAL_TABLET | Freq: Four times a day (QID) | ORAL | Status: DC | PRN

## 2016-08-21 MED ORDER — LIDOCAINE 2% (20 MG/ML) 5 ML SYRINGE
INTRAMUSCULAR | Status: DC | PRN
  Administered 2016-08-21: 80 mg via INTRAVENOUS

## 2016-08-21 MED ORDER — ESMOLOL HCL 100 MG/10ML IV SOLN
INTRAVENOUS | Status: DC | PRN
  Administered 2016-08-21 (×12): 10 mg via INTRAVENOUS

## 2016-08-21 MED ORDER — SODIUM CHLORIDE 0.9 % IV SOLN
30.0000 meq | Freq: Once | INTRAVENOUS | Status: AC
  Administered 2016-08-21: 20 meq via INTRAVENOUS
  Filled 2016-08-21: qty 15

## 2016-08-21 MED ORDER — OXYCODONE HCL 5 MG PO TABS: 5.0000 mg | ORAL_TABLET | Freq: Once | ORAL | Status: DC | PRN

## 2016-08-21 MED ORDER — HYDROMORPHONE HCL 1 MG/ML IJ SOLN
0.2500 mg | INTRAMUSCULAR | Status: DC | PRN
  Administered 2016-08-21: 0.5 mg via INTRAVENOUS

## 2016-08-21 MED ORDER — INSULIN GLARGINE 100 UNIT/ML SOLOSTAR PEN: 15.0000 [IU] | PEN_INJECTOR | SUBCUTANEOUS | Status: DC

## 2016-08-21 MED ORDER — DIPHENHYDRAMINE HCL 50 MG/ML IJ SOLN: 12.5000 mg | Freq: Four times a day (QID) | INTRAMUSCULAR | Status: DC | PRN

## 2016-08-21 MED ORDER — ONDANSETRON HCL 4 MG/2ML IJ SOLN
INTRAMUSCULAR | Status: DC | PRN
  Administered 2016-08-21: 4 mg via INTRAVENOUS

## 2016-08-21 MED ORDER — BUPIVACAINE HCL (PF) 0.25 % IJ SOLN
INTRAMUSCULAR | Status: DC | PRN
  Administered 2016-08-21: 10 mL

## 2016-08-21 MED ORDER — DEXAMETHASONE SODIUM PHOSPHATE 10 MG/ML IJ SOLN
INTRAMUSCULAR | Status: AC
  Filled 2016-08-21: qty 1

## 2016-08-21 MED ORDER — PHENYLEPHRINE HCL 10 MG/ML IJ SOLN
INTRAMUSCULAR | Status: AC
  Filled 2016-08-21: qty 1

## 2016-08-21 MED ORDER — INSULIN ASPART 100 UNIT/ML ~~LOC~~ SOLN
8.0000 [IU] | Freq: Once | SUBCUTANEOUS | Status: AC
  Administered 2016-08-21: 8 [IU] via SUBCUTANEOUS

## 2016-08-21 MED ORDER — HYDROMORPHONE HCL 1 MG/ML IJ SOLN
0.2500 mg | INTRAMUSCULAR | Status: DC | PRN
  Administered 2016-08-21 (×4): 0.5 mg via INTRAVENOUS

## 2016-08-21 MED ORDER — KETAMINE HCL 10 MG/ML IJ SOLN
INTRAMUSCULAR | Status: DC | PRN
  Administered 2016-08-21: 20 mg via INTRAVENOUS
  Administered 2016-08-21: 10 mg via INTRAVENOUS
  Administered 2016-08-21: 20 mg via INTRAVENOUS
  Administered 2016-08-21: 10 mg via INTRAVENOUS
  Administered 2016-08-21: 30 mg via INTRAVENOUS

## 2016-08-21 MED ORDER — ONDANSETRON HCL 4 MG/2ML IJ SOLN
4.0000 mg | Freq: Four times a day (QID) | INTRAMUSCULAR | Status: DC | PRN
  Administered 2016-08-27 – 2016-09-01 (×10): 4 mg via INTRAVENOUS
  Filled 2016-08-21 (×5): qty 2

## 2016-08-21 MED ORDER — ALBUMIN HUMAN 5 % IV SOLN
INTRAVENOUS | Status: AC
  Filled 2016-08-21: qty 250

## 2016-08-21 MED ORDER — INSULIN GLARGINE 100 UNIT/ML ~~LOC~~ SOLN
15.0000 [IU] | Freq: Every day | SUBCUTANEOUS | Status: DC
  Administered 2016-08-22 – 2016-08-30 (×9): 15 [IU] via SUBCUTANEOUS
  Filled 2016-08-21 (×12): qty 0.15

## 2016-08-21 MED ORDER — ENOXAPARIN SODIUM 40 MG/0.4ML ~~LOC~~ SOLN
40.0000 mg | SUBCUTANEOUS | Status: DC
  Administered 2016-08-22 – 2016-09-04 (×14): 40 mg via SUBCUTANEOUS
  Filled 2016-08-21 (×14): qty 0.4

## 2016-08-21 MED ORDER — SUGAMMADEX SODIUM 200 MG/2ML IV SOLN
INTRAVENOUS | Status: AC
  Filled 2016-08-21: qty 2

## 2016-08-21 MED ORDER — BUPIVACAINE ON-Q PAIN PUMP (FOR ORDER SET NO CHG)
INJECTION | Status: AC
  Filled 2016-08-21: qty 1

## 2016-08-21 MED ORDER — PHENYLEPHRINE HCL 10 MG/ML IJ SOLN
INTRAVENOUS | Status: DC | PRN
  Administered 2016-08-21: 25 ug/min via INTRAVENOUS

## 2016-08-21 MED ORDER — FENTANYL CITRATE (PF) 100 MCG/2ML IJ SOLN
INTRAMUSCULAR | Status: AC
  Filled 2016-08-21: qty 2

## 2016-08-21 MED ORDER — PROPOFOL 10 MG/ML IV BOLUS
INTRAVENOUS | Status: AC
  Filled 2016-08-21: qty 40

## 2016-08-21 MED ORDER — DIPHENHYDRAMINE HCL 12.5 MG/5ML PO ELIX: 12.5000 mg | ORAL_SOLUTION | Freq: Four times a day (QID) | ORAL | Status: DC | PRN

## 2016-08-21 MED ORDER — DIPHENHYDRAMINE HCL 50 MG/ML IJ SOLN
12.5000 mg | Freq: Four times a day (QID) | INTRAMUSCULAR | Status: DC | PRN
  Administered 2016-09-03: 12.5 mg via INTRAVENOUS

## 2016-08-21 MED ORDER — SCOPOLAMINE 1 MG/3DAYS TD PT72
MEDICATED_PATCH | TRANSDERMAL | Status: DC | PRN
  Administered 2016-08-21: 1.5 mg via TRANSDERMAL

## 2016-08-21 MED ORDER — ROCURONIUM BROMIDE 50 MG/5ML IV SOSY
PREFILLED_SYRINGE | INTRAVENOUS | Status: AC
  Filled 2016-08-21: qty 10

## 2016-08-21 MED ORDER — FENTANYL CITRATE (PF) 250 MCG/5ML IJ SOLN
INTRAMUSCULAR | Status: AC
  Filled 2016-08-21: qty 5

## 2016-08-21 MED ORDER — KETAMINE HCL 10 MG/ML IJ SOLN
INTRAMUSCULAR | Status: AC
  Filled 2016-08-21: qty 1

## 2016-08-21 MED ORDER — ONDANSETRON HCL 4 MG/2ML IJ SOLN
4.0000 mg | Freq: Four times a day (QID) | INTRAMUSCULAR | Status: DC | PRN
  Administered 2016-08-22: 4 mg via INTRAVENOUS

## 2016-08-21 MED ORDER — METOCLOPRAMIDE HCL 5 MG/ML IJ SOLN
INTRAMUSCULAR | Status: DC | PRN
  Administered 2016-08-21: 10 mg via INTRAVENOUS

## 2016-08-21 MED ORDER — PROPOFOL 10 MG/ML IV BOLUS
INTRAVENOUS | Status: DC | PRN
  Administered 2016-08-21: 180 mg via INTRAVENOUS

## 2016-08-21 MED ORDER — FENTANYL CITRATE (PF) 100 MCG/2ML IJ SOLN
INTRAMUSCULAR | Status: DC | PRN
  Administered 2016-08-21: 100 ug via INTRAVENOUS
  Administered 2016-08-21 (×5): 50 ug via INTRAVENOUS

## 2016-08-21 MED ORDER — EVICEL 5 ML EX KIT
PACK | Freq: Once | CUTANEOUS | Status: AC
  Administered 2016-08-21: 1
  Filled 2016-08-21: qty 1

## 2016-08-21 MED ORDER — LACTATED RINGERS IV SOLN
INTRAVENOUS | Status: DC | PRN
  Administered 2016-08-21 (×2): via INTRAVENOUS

## 2016-08-21 MED ORDER — SODIUM CHLORIDE 0.9 % IV SOLN
Freq: Once | INTRAVENOUS | Status: AC
  Administered 2016-08-21: 12:00:00 via INTRAVENOUS

## 2016-08-21 MED ORDER — 0.9 % SODIUM CHLORIDE (POUR BTL) OPTIME
TOPICAL | Status: DC | PRN
  Administered 2016-08-21: 2000 mL

## 2016-08-21 MED ORDER — MIDAZOLAM HCL 2 MG/2ML IJ SOLN
INTRAMUSCULAR | Status: AC
  Filled 2016-08-21: qty 2

## 2016-08-21 MED ORDER — HYDROMORPHONE HCL 1 MG/ML IJ SOLN: 0.5000 mg | INTRAMUSCULAR | Status: DC | PRN

## 2016-08-21 MED ORDER — HYDROMORPHONE HCL 2 MG/ML IJ SOLN
INTRAMUSCULAR | Status: AC
  Filled 2016-08-21: qty 1

## 2016-08-21 MED ORDER — PHENYLEPHRINE 40 MCG/ML (10ML) SYRINGE FOR IV PUSH (FOR BLOOD PRESSURE SUPPORT)
PREFILLED_SYRINGE | INTRAVENOUS | Status: DC | PRN
  Administered 2016-08-21 (×4): 80 ug via INTRAVENOUS

## 2016-08-21 MED ORDER — KCL IN DEXTROSE-NACL 20-5-0.45 MEQ/L-%-% IV SOLN
INTRAVENOUS | Status: AC
  Administered 2016-08-21: 1000 mL via INTRAVENOUS
  Administered 2016-08-22 – 2016-08-29 (×10): via INTRAVENOUS
  Filled 2016-08-21 (×14): qty 1000

## 2016-08-21 MED ORDER — LIDOCAINE-EPINEPHRINE (PF) 1 %-1:200000 IJ SOLN
INTRAMUSCULAR | Status: AC
  Filled 2016-08-21: qty 30

## 2016-08-21 MED ORDER — ALBUMIN HUMAN 5 % IV SOLN
INTRAVENOUS | Status: DC | PRN
  Administered 2016-08-21: 09:00:00 via INTRAVENOUS

## 2016-08-21 MED ORDER — LACTATED RINGERS IV SOLN
INTRAVENOUS | Status: DC | PRN
  Administered 2016-08-21 (×2): via INTRAVENOUS

## 2016-08-21 MED ORDER — ONDANSETRON HCL 4 MG/2ML IJ SOLN: 4.0000 mg | Freq: Four times a day (QID) | INTRAMUSCULAR | Status: DC | PRN

## 2016-08-21 MED ORDER — DEXAMETHASONE SODIUM PHOSPHATE 10 MG/ML IJ SOLN
INTRAMUSCULAR | Status: DC | PRN
  Administered 2016-08-21: 10 mg via INTRAVENOUS

## 2016-08-21 MED ORDER — LIDOCAINE 2% (20 MG/ML) 5 ML SYRINGE
INTRAMUSCULAR | Status: AC
  Filled 2016-08-21: qty 5

## 2016-08-21 MED ORDER — PANTOPRAZOLE SODIUM 40 MG IV SOLR
40.0000 mg | Freq: Every day | INTRAVENOUS | Status: DC
Start: 2016-08-21 — End: 2016-08-27
  Administered 2016-08-21 – 2016-08-26 (×6): 40 mg via INTRAVENOUS
  Filled 2016-08-21 (×6): qty 40

## 2016-08-21 MED ORDER — OXYCODONE HCL 5 MG/5ML PO SOLN: 5.0000 mg | Freq: Once | ORAL | Status: DC | PRN

## 2016-08-21 MED ORDER — SCOPOLAMINE 1 MG/3DAYS TD PT72
MEDICATED_PATCH | TRANSDERMAL | Status: AC
  Filled 2016-08-21: qty 1

## 2016-08-21 MED ORDER — CEFAZOLIN SODIUM-DEXTROSE 2-4 GM/100ML-% IV SOLN
2.0000 g | INTRAVENOUS | Status: AC
  Administered 2016-08-21 (×2): 2 g via INTRAVENOUS
  Filled 2016-08-21: qty 100

## 2016-08-21 MED ORDER — DIPHENHYDRAMINE HCL 50 MG/ML IJ SOLN
12.5000 mg | Freq: Four times a day (QID) | INTRAMUSCULAR | Status: DC | PRN
  Filled 2016-08-21: qty 1

## 2016-08-21 MED ORDER — SUGAMMADEX SODIUM 500 MG/5ML IV SOLN
INTRAVENOUS | Status: DC | PRN
  Administered 2016-08-21: 400 mg via INTRAVENOUS

## 2016-08-21 MED ORDER — METOCLOPRAMIDE HCL 5 MG/ML IJ SOLN
INTRAMUSCULAR | Status: AC
  Filled 2016-08-21: qty 2

## 2016-08-21 MED ORDER — HYDRALAZINE HCL 20 MG/ML IJ SOLN: 10.0000 mg | INTRAMUSCULAR | Status: DC | PRN

## 2016-08-21 SURGICAL SUPPLY — 137 items
ADH SKN CLS APL DERMABOND .7 (GAUZE/BANDAGES/DRESSINGS)
APL SKNCLS STERI-STRIP NONHPOA (GAUZE/BANDAGES/DRESSINGS)
BAG BILE SMALL  12/CS (BAG) ×4 IMPLANT
BENZOIN TINCTURE PRP APPL 2/3 (GAUZE/BANDAGES/DRESSINGS) IMPLANT
BLADE EXTENDED COATED 6.5IN (ELECTRODE) ×3 IMPLANT
BOOT SUTURE VASCULAR YLW (MISCELLANEOUS) ×6
BRR ADH 5X3 SEPRAFILM 6 SHT (MISCELLANEOUS)
CABLE HIGH FREQUENCY MONO STRZ (ELECTRODE) ×2 IMPLANT
CATH FOLEY 2WAY SLVR  5CC 16FR (CATHETERS)
CATH FOLEY 2WAY SLVR 5CC 16FR (CATHETERS) IMPLANT
CATH KIT ON-Q SILVERSOAK 7.5 (CATHETERS) IMPLANT
CATH KIT ON-Q SILVERSOAK 7.5IN (CATHETERS) IMPLANT
CATH ROBINSON RED A/P 12FR (CATHETERS) IMPLANT
CATH ROBINSON RED A/P 14FR (CATHETERS) IMPLANT
CATH ROBINSON RED A/P 16FR (CATHETERS) IMPLANT
CATH ROBINSON RED A/P 18FR (CATHETERS) ×2 IMPLANT
CATH ROBINSON RED A/P 20FR (CATHETERS) IMPLANT
CHLORAPREP W/TINT 26ML (MISCELLANEOUS) ×3 IMPLANT
CLAMP SUTURE YELLOW 5 PAIRS (MISCELLANEOUS) ×2 IMPLANT
CLIP LIGATING HEM O LOK PURPLE (MISCELLANEOUS) ×5 IMPLANT
CLIP LIGATING HEMO O LOK GREEN (MISCELLANEOUS) ×3 IMPLANT
CLIP LIGATING HEMOLOK MED (MISCELLANEOUS) ×5 IMPLANT
CLIP TI LARGE 6 (CLIP) ×3 IMPLANT
CLIP TI MEDIUM 6 (CLIP) ×5 IMPLANT
CLOSURE WOUND 1/2 X4 (GAUZE/BANDAGES/DRESSINGS)
COUNTER NEEDLE 20 DBL MAG RED (NEEDLE) ×2 IMPLANT
COVER MAYO STAND STRL (DRAPES) ×2 IMPLANT
COVER SURGICAL LIGHT HANDLE (MISCELLANEOUS) ×1 IMPLANT
DECANTER SPIKE VIAL GLASS SM (MISCELLANEOUS) ×1 IMPLANT
DERMABOND ADVANCED (GAUZE/BANDAGES/DRESSINGS)
DERMABOND ADVANCED .7 DNX12 (GAUZE/BANDAGES/DRESSINGS) IMPLANT
DISSECTOR ROUND CHERRY 3/8 STR (MISCELLANEOUS) IMPLANT
DRAIN CHANNEL 19F RND (DRAIN) ×4 IMPLANT
DRAPE LAPAROSCOPIC ABDOMINAL (DRAPES) ×2 IMPLANT
DRAPE LG THREE QUARTER DISP (DRAPES) ×4 IMPLANT
DRAPE TOWEL STR TPT 18X26 WHT (DRAPES) ×6 IMPLANT
DRAPE UTILITY XL STRL (DRAPES) ×5 IMPLANT
DRAPE WARM FLUID 44X44 (DRAPE) ×3 IMPLANT
DRESSING TELFA ISLAND 4X8 (GAUZE/BANDAGES/DRESSINGS) ×2 IMPLANT
DRSG TEGADERM 2-3/8X2-3/4 SM (GAUZE/BANDAGES/DRESSINGS) ×2 IMPLANT
DRSG TEGADERM 4X4.75 (GAUZE/BANDAGES/DRESSINGS) ×2 IMPLANT
DRSG TELFA 3X8 NADH (GAUZE/BANDAGES/DRESSINGS) ×3 IMPLANT
DRSG TELFA 4X10 ISLAND STR (GAUZE/BANDAGES/DRESSINGS) ×2 IMPLANT
DRSG TELFA PLUS 4X6 ADH ISLAND (GAUZE/BANDAGES/DRESSINGS) IMPLANT
ELECT REM PT RETURN 9FT ADLT (ELECTROSURGICAL) ×3
ELECTRODE REM PT RTRN 9FT ADLT (ELECTROSURGICAL) ×1 IMPLANT
ENDOLOOP SUT PDS II  0 18 (SUTURE)
ENDOLOOP SUT PDS II 0 18 (SUTURE) IMPLANT
EVACUATOR SILICONE 100CC (DRAIN) IMPLANT
GAUZE SPONGE 2X2 8PLY STRL LF (GAUZE/BANDAGES/DRESSINGS) IMPLANT
GAUZE SPONGE 4X4 12PLY STRL (GAUZE/BANDAGES/DRESSINGS) IMPLANT
GLOVE BIO SURGEON STRL SZ 6 (GLOVE) ×6 IMPLANT
GLOVE INDICATOR 6.5 STRL GRN (GLOVE) ×6 IMPLANT
GOWN STRL REIN 2XL LVL4 (GOWN DISPOSABLE) ×3 IMPLANT
GOWN STRL REUS W/ TWL XL LVL3 (GOWN DISPOSABLE) ×3 IMPLANT
GOWN STRL REUS W/TWL 2XL LVL3 (GOWN DISPOSABLE) ×6 IMPLANT
GOWN STRL REUS W/TWL XL LVL3 (GOWN DISPOSABLE) ×10 IMPLANT
HANDLE SUCTION POOLE (INSTRUMENTS) ×1 IMPLANT
HEMOSTAT SNOW SURGICEL 2X4 (HEMOSTASIS) ×2 IMPLANT
HEMOSTAT SURGICEL 4X8 (HEMOSTASIS) IMPLANT
IRRIG SUCT STRYKERFLOW 2 WTIP (MISCELLANEOUS)
IRRIGATION SUCT STRKRFLW 2 WTP (MISCELLANEOUS) IMPLANT
KIT BASIN OR (CUSTOM PROCEDURE TRAY) ×3 IMPLANT
KIT MARKER MARGIN INK (KITS) ×2 IMPLANT
L-HOOK LAP DISP 36CM (ELECTROSURGICAL) ×3
LHOOK LAP DISP 36CM (ELECTROSURGICAL) IMPLANT
LOOP MINI RED (MISCELLANEOUS) ×3 IMPLANT
LOOP VESSEL MAXI BLUE (MISCELLANEOUS) ×3 IMPLANT
MARKER SKIN DUAL TIP RULER LAB (MISCELLANEOUS) ×3 IMPLANT
NDL BIOPSY 14GX4.5 SOFT TIS (NEEDLE) IMPLANT
NEEDLE BIOPSY 14GX4.5 SOFT TIS (NEEDLE) IMPLANT
NEEDLE HYPO 22GX1.5 SAFETY (NEEDLE) ×3 IMPLANT
PACK GENERAL/GYN (CUSTOM PROCEDURE TRAY) ×1 IMPLANT
PACK UNIVERSAL I (CUSTOM PROCEDURE TRAY) ×1 IMPLANT
PAD DRESSING TELFA 3X8 NADH (GAUZE/BANDAGES/DRESSINGS) IMPLANT
PLUG CATH AND CAP STER (CATHETERS) IMPLANT
RELOAD PROXIMATE 75MM BLUE (ENDOMECHANICALS) ×6 IMPLANT
RELOAD STAPLE 75 3.8 BLU REG (ENDOMECHANICALS) ×2 IMPLANT
RELOAD STAPLER LINE PROX 30 GR (STAPLE) ×1 IMPLANT
SEPRAFILM PROCEDURAL PACK 3X5 (MISCELLANEOUS) IMPLANT
SHEARS FOC LG CVD HARMONIC 17C (MISCELLANEOUS) ×3 IMPLANT
SHEARS HARMONIC ACE PLUS 36CM (ENDOMECHANICALS) IMPLANT
SLEEVE SURGEON STRL (DRAPES) IMPLANT
SLEEVE XCEL OPT CAN 5 100 (ENDOMECHANICALS) ×3 IMPLANT
SOLUTION ANTI FOG 6CC (MISCELLANEOUS) ×3 IMPLANT
SPONGE DRAIN TRACH 4X4 STRL 2S (GAUZE/BANDAGES/DRESSINGS) ×2 IMPLANT
SPONGE GAUZE 2X2 STER 10/PKG (GAUZE/BANDAGES/DRESSINGS) ×2
SPONGE LAP 18X18 X RAY DECT (DISPOSABLE) ×12 IMPLANT
STAPLER PROXIMATE 75MM BLUE (STAPLE) ×3 IMPLANT
STAPLER RELOAD LINE PROX 30 GR (STAPLE) ×3
STAPLER RELOADABLE 30 GRN THCK (STAPLE) IMPLANT
STAPLER VISISTAT 35W (STAPLE) ×3 IMPLANT
STRIP CLOSURE SKIN 1/2X4 (GAUZE/BANDAGES/DRESSINGS) IMPLANT
SUCTION POOLE HANDLE (INSTRUMENTS) ×3
SUT 5.0 PDS RB-1 (SUTURE) ×10
SUT ETHILON 2 0 PS N (SUTURE) ×4 IMPLANT
SUT MNCRL AB 4-0 PS2 18 (SUTURE) IMPLANT
SUT PDS AB 1 TP1 54 (SUTURE) IMPLANT
SUT PDS AB 1 TP1 96 (SUTURE) ×4 IMPLANT
SUT PDS AB 3-0 SH 27 (SUTURE) ×10 IMPLANT
SUT PDS AB 4-0 RB1 27 (SUTURE) ×26 IMPLANT
SUT PDS PLUS AB 5-0 RB-1 (SUTURE) IMPLANT
SUT PROLENE 3 0 SH 48 (SUTURE) ×6 IMPLANT
SUT PROLENE 3 0 SH1 36 (SUTURE) IMPLANT
SUT PROLENE 4 0 RB 1 (SUTURE) ×9
SUT PROLENE 4-0 RB1 .5 CRCL 36 (SUTURE) IMPLANT
SUT PROLENE 5 0 CC 1 (SUTURE) IMPLANT
SUT SILK 2 0 (SUTURE) ×3
SUT SILK 2 0 SH CR/8 (SUTURE) ×6 IMPLANT
SUT SILK 2-0 18XBRD TIE 12 (SUTURE) ×1 IMPLANT
SUT SILK 3 0 (SUTURE)
SUT SILK 3 0 SH CR/8 (SUTURE) ×4 IMPLANT
SUT SILK 3-0 18XBRD TIE 12 (SUTURE) IMPLANT
SUT VIC AB 2-0 SH 18 (SUTURE) ×3 IMPLANT
SUT VIC AB 3-0 SH 18 (SUTURE) ×3 IMPLANT
SUT VIC AB 4-0 PS2 27 (SUTURE) IMPLANT
SUT VICRYL 2 0 18  UND BR (SUTURE) ×2
SUT VICRYL 2 0 18 UND BR (SUTURE) ×1 IMPLANT
SUT VICRYL 3 0 BR 18  UND (SUTURE) ×2
SUT VICRYL 3 0 BR 18 UND (SUTURE) ×1 IMPLANT
SYR 20CC LL (SYRINGE) ×3 IMPLANT
SYR BULB IRRIGATION 50ML (SYRINGE) ×2 IMPLANT
TAG SUTURE CLAMP YLW 5PR (MISCELLANEOUS) ×2
TAPE UMBILICAL COTTON 1/8X30 (MISCELLANEOUS) ×3 IMPLANT
TIP RIGID 35CM EVICEL (HEMOSTASIS) ×2 IMPLANT
TOWEL OR 17X26 10 PK STRL BLUE (TOWEL DISPOSABLE) ×7 IMPLANT
TOWEL OR NON WOVEN STRL DISP B (DISPOSABLE) ×6 IMPLANT
TRAY FOLEY W/METER SILVER 16FR (SET/KITS/TRAYS/PACK) ×3 IMPLANT
TRAY LAPAROSCOPIC (CUSTOM PROCEDURE TRAY) ×3 IMPLANT
TROCAR XCEL BLUNT TIP 100MML (ENDOMECHANICALS) ×1 IMPLANT
TROCAR XCEL NON-BLD 11X100MML (ENDOMECHANICALS) IMPLANT
TROCAR XCEL UNIV SLVE 11M 100M (ENDOMECHANICALS) IMPLANT
TUBE FEEDING 5FR 36IN KANGAROO (TUBING) ×2 IMPLANT
TUBE FEEDING 8FR 16IN STR KANG (MISCELLANEOUS) IMPLANT
TUBING INSUF HEATED (TUBING) ×3 IMPLANT
TUNNELER SHEATH ON-Q 16GX12 DP (PAIN MANAGEMENT) ×2 IMPLANT
WATER STERILE IRR 1500ML POUR (IV SOLUTION) ×1 IMPLANT

## 2016-08-21 NOTE — Anesthesia Procedure Notes (Signed)
Arterial Line Insertion Start/End1/06/2017 7:00 AM, 08/21/2016 7:10 AM Performed by: Frances Nickels, CRNA  Patient location: OR. Preanesthetic checklist: patient identified, IV checked, site marked, risks and benefits discussed, surgical consent, monitors and equipment checked, pre-op evaluation, timeout performed and anesthesia consent Lidocaine 1% used for infiltration Right, radial was placed Catheter size: 20 Fr Maximum sterile barriers used   Attempts: 1 Procedure performed without using ultrasound guided technique. Following insertion, dressing applied and Biopatch. Post procedure assessment: normal  Patient tolerated the procedure well with no immediate complications.

## 2016-08-21 NOTE — Progress Notes (Signed)
Oncology Nurse Navigator Documentation  Oncology Nurse Navigator Flowsheets 08/21/2016  Navigator Location [facility]  Referral date to RadOnc/MedOnc -  Navigator Encounter Type Letter/Fax/Email  Telephone -  Abnormal Finding Date -  Confirmed Diagnosis Date -  Surgery Date 08/21/2016  Patient Visit Type -  Treatment Phase -  Barriers/Navigation Needs Coordination of Care--F/U with Dr. Burr Medico  Education -  Interventions Coordination of Care--sent LOS to scheduler for f/u week of 09/22/16  Coordination of Care Appts  Education Method -  Support Groups/Services -  Acuity -  Time Spent with Patient -

## 2016-08-21 NOTE — Telephone Encounter (Signed)
Appointment scheduled per scheduling message. Patient notified.

## 2016-08-21 NOTE — Op Note (Addendum)
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 Firm pancreatic tissue. 8 mm common bile duct. 4 mm pancreatic duct  SPECIMENS:  1. Pancreaticoduodenectomy with gallbladder:  2. Portal nodes  3. Common hepatic artery nodes 4.  Liver surface nodule  ESTIMATED BLOOD LOSS: 250 mL.   COMPLICATIONS: None known.   PROCEDURE:   Pt was identified in the holding area, taken to  the operating room, and placed supine on the operating room  table. General anesthesia was induced. The patient's abdomen was  prepped and draped in a sterile fashion, after a Foley catheter was  placed. A time-out was performed according to the surgical safety check  list. When all was correct we continued.   The patient was placed in reverse trendelenburg position and rotated to the right.  The left subcostal margin was anesthetized with local anesthesia.  A 5 mm optiview trocar was placed under direct visualization.  The abdomen was insufflated with carbon dioxide.  The abdomen was examined.  A second port was placed in the upper midline to be able to better visualize the right liver.  A small nodule on the surface of the left lateral segment was seen.  This was sent for frozen section and was negative.  A midline incision was made from the xiphoid to just above the umbilicus.  The subcutaneous tissues were divided with the Bovie cautery. The peritoneum was entered in the center of the abdomen. Digital retraction was then used to elevate the preperitoneal fat, and  this was taken with the  cautery as well. The subcutaneous tissues and  fascia of the muscular layers were taken laterally with the cautery.  Care was taken to protect the underlying viscera. The Bookwalter self-retaining retractor was placed  for visualization. The right colon was taken down off of the white line  of Toldt and from the retroperitoneum at the hepatic flexure. The porta was identified. The  duodenum was kocherized extensively with blunt dissection and with cautery. The gallbladder was taken off the liver with a combination of blunt dissection and cautery. The cystic duct was clipped with the Hemalock clips. The cystic duct was divided and the gallbladder was passed off.   The common bile duct was skeletonized near the duodenum. A vessel loop was passed around it. The gastroduodenal artery, as well as the common hepatic artery were skeletonized. The proper hepatic artery was traced out to make sure that flow was going to both sides of the liver when the GDA was clamped. The GDA was test clamped with the bulldog, with good flow to the liver and  no signs of ischemia. This was divided with 2-0 silk ties and then clipped. The proper hepatic artery was reflected upward, and the anterior portal vein was exposed. There were some enlarged nodes along the porta and the common hepatic artery.  These were removed with harmonic and cautery. A Kelly clamp was passed underneath the pancreas at the superior mesenteric vein, and this passed  easily with no signs of tumor involvement.   Attention was then directed to the stomach, and the omentum was taken  off of the stomach at  the border of the antrum and the body. The  gastrohepatic ligament was taken down with the harmonic, and care was  taken to make sure there was not a replaced left hepatic artery in this  location. The stomach was divided with the GIA-75 stapler. The border  of the stomach was oversewn with a 3-0 running PDS suture.   Attention was then directed to  the small bowel. Around 10 cm past the  ligament of Treitz it was located, and this was divided with the 75-GIA.  The distal portion of the jejunum was also oversewn with a 3-0 PDS  suture. The fourth portion of the duodenum was skeletonized with the  harmonic scalpel, taking down all of the mesenteric vessels. The  ligament of Treitz was taken down. The IMV was preserved.  The duodenum was then passed underneath the portal vein.   At this point the Claiborne Billings was replaced and the pancreas was divided with the TA60.   2-0 silk sutures were tied down and the inferior and superior border of the pancreas. The Bovie was used to coagulate the small bleeders at the border of the pancreas.  The Overholt in combination with the harmonic and locking Weck clips  were then used to take the uncinate process off of the portal vein and  the superior mesenteric artery. Care was taken not to incorporate the  superior mesenteric artery in the dissection. The specimen was then marked and passed off the table for frozen section margin.   The jejunum was then passed underneath  the SMV in order to get appropriate lie for the pancreatic and biliary  anastomoses. The more distal portion of the jejunum was pulled up over  the colon, and two 3-0 silks were placed through the posterior border  of the stomach for the gastrojejunostomy. The stomach and the small  bowel were opened, and a GIA-75 was used to create an end-to-end  anastomosis. The open areas of the staple line were examined to ensure  that there was hemostasis. The defect was then closed with a single  layer of running Connell suture of 3-0 PDS. Prior to a complete  closure, the NG tube was passed toward the afferent limb.   The appropriate location for the choledochojejunostomy was identified, and  the small bowel was opened approximately 8 mm. The anastamosis was created with approximately nine 4-0 interrupted PDS sutures.   The 2 corner sutures were  placed first  and then the posterior layer was done in an interrupted fashion tying on  the inside. The superior layer was then closed with interrupted sutures as  well.   At this point the frozens returned back as all negative. The pancreatic  anastomosis was then created by opening the jejunum the length  of the pancreatic parenchyma. The pancreas was firm, and the duct was 5 mm. A pediatric feeding tube was used as a pancreatic stent. The posterior layer was formed first with 2-0 silk sutures in interrupted fashion.  A duct to mucosa anastomosis was created with 5-0 PDS. The anterior layer was then oversewn with 2-0 silks to dunk the pancreatic parenchyma.   The areas were then irrigated and then those anastomoses were covered  with Evicel. This was allowed to dry. The abdomen was then irrigated  again and all the laparotomy sponges were removed. A lap count was  performed, which was correct. Two 19-Blake drains were placed, with the  lateral-most drain placed behind the choledochojejunostomy. The medial  Keenan Bachelor  drain was placed just anterior and slightly superior to the  pancreaticojejunostomy.   A 18 Fr red rubber feeding tube was placed in the left lateral abdomen.  This was witzeled with 3-0 silk sutures and secured to the inner abdominal wall with 2-0 pexy sutures.  The OnQ tunnelers were placed on either side of the incision in the preperitoneal location.     The fascia was then closed with #1 looped running PDS sutures. The skin was irrigated and then closed with staples. The wounds were cleaned, dried and dressed with a sterile  dressing.   The patient tolerated the procedure well and was extubated and taken to  PACU in stable condition. Needle and sponge counts were correct x2.

## 2016-08-21 NOTE — Transfer of Care (Signed)
Immediate Anesthesia Transfer of Care Note  Patient: Shamel K Scialdone  Procedure(s) Performed: Procedure(s): LAPAROSCOPY DIAGNOSTIC (N/A) WHIPPLE PROCEDURE (N/A)  Patient Location: PACU  Anesthesia Type:General  Level of Consciousness:  sedated, patient cooperative and responds to stimulation  Airway & Oxygen Therapy:Patient Spontanous Breathing and Patient connected to face mask oxgen  Post-op Assessment:  Report given to PACU RN and Post -op Vital signs reviewed and stable  Post vital signs:  Reviewed and stable  Last Vitals:  Vitals:   08/21/16 0559  BP: (!) 142/90  Pulse: 100  Resp: 16  Temp: 123XX123 C    Complications: No apparent anesthesia complications  Anesthesia Transfer of Care Note  Patient: Jashad K Facey  Procedure(s) Performed: Procedure(s): LAPAROSCOPY DIAGNOSTIC (N/A) WHIPPLE PROCEDURE (N/A)  Patient Location: PACU  Anesthesia Type:General  Level of Consciousness: awake, alert  and oriented  Airway & Oxygen Therapy: Patient Spontanous Breathing and Patient connected to face mask oxygen  Post-op Assessment: Report given to RN  Post vital signs: Reviewed and stable  Last Vitals:  Vitals:   08/21/16 0559  BP: (!) 142/90  Pulse: 100  Resp: 16  Temp: 36.6 C    Last Pain:  Vitals:   08/21/16 0559  TempSrc: Oral      Patients Stated Pain Goal: 3 ([id number])  Complications: No apparent anesthesia complications

## 2016-08-21 NOTE — Anesthesia Postprocedure Evaluation (Signed)
Anesthesia Post Note  Patient: Craig [name]  Procedure(s) Performed: Procedure(s) (LRB): LAPAROSCOPY DIAGNOSTIC (N/A) WHIPPLE PROCEDURE (N/A)  Patient location during evaluation: PACU Anesthesia Type: General Level of consciousness: awake and alert and patient cooperative Pain management: pain level controlled Vital Signs Assessment: post-procedure vital signs reviewed and stable Respiratory status: spontaneous breathing and respiratory function stable Cardiovascular status: stable Anesthetic complications: no       Last Vitals:  Vitals:   08/21/16 1550 08/21/16 1600  BP:  113/77  Pulse:  (!) 116  Resp: 16 15  Temp:  37.2 C    Last Pain:  Vitals:   08/21/16 1600  TempSrc:   PainSc: Gu-Win

## 2016-08-21 NOTE — Anesthesia Procedure Notes (Signed)
Procedure Name: Intubation Date/Time: 08/21/2016 8:00 AM Performed by: Jowanda Heeg, Virgel Gess Pre-anesthesia Checklist: Patient identified, Emergency Drugs available, Suction available, Patient being monitored and Timeout performed Patient Re-evaluated:Patient Re-evaluated prior to inductionOxygen Delivery Method: Circle system utilized Preoxygenation: Pre-oxygenation with 100% oxygen Intubation Type: IV induction Ventilation: Mask ventilation without difficulty Laryngoscope Size: Mac and 4 Grade View: Grade II Tube type: Subglottic suction tube Tube size: 7.5 mm Number of attempts: 1 Airway Equipment and Method: Stylet Placement Confirmation: ETT inserted through vocal cords under direct vision,  positive ETCO2,  CO2 detector and breath sounds checked- equal and bilateral Secured at: 23 cm Tube secured with: Tape Dental Injury: Teeth and Oropharynx as per pre-operative assessment

## 2016-08-21 NOTE — Anesthesia Preprocedure Evaluation (Signed)
Anesthesia Evaluation  Patient identified by MRN, date of birth, ID band Patient awake    Reviewed: Allergy & Precautions, H&P , NPO status , Patient's Chart, lab work & pertinent test results  Airway Mallampati: II   Neck ROM: full    Dental   Pulmonary former smoker,    breath sounds clear to auscultation       Cardiovascular hypertension,  Rhythm:regular Rate:Normal     Neuro/Psych    GI/Hepatic (+)     substance abuse  alcohol use, Pancreatic CA   Endo/Other  diabetes, Type 2  Renal/GU      Musculoskeletal   Abdominal   Peds  Hematology   Anesthesia Other Findings   Reproductive/Obstetrics                             Anesthesia Physical Anesthesia Plan  ASA: III  Anesthesia Plan: General   Post-op Pain Management:    Induction: Intravenous  Airway Management Planned: Oral ETT  Additional Equipment: Arterial line  Intra-op Plan:   Post-operative Plan: Possible Post-op intubation/ventilation  Informed Consent: I have reviewed the patients History and Physical, chart, labs and discussed the procedure including the risks, benefits and alternatives for the proposed anesthesia with the patient or authorized representative who has indicated his/her understanding and acceptance.     Plan Discussed with: CRNA, Anesthesiologist and Surgeon  Anesthesia Plan Comments:         Anesthesia Quick Evaluation

## 2016-08-21 NOTE — Interval H&P Note (Signed)
History and Physical Interval Note:  08/21/2016 7:31 AM  Craig [name] Craig [name]  has presented today for surgery, with the diagnosis of pancreatic cancer  The various methods of treatment have been discussed with the patient and family. After consideration of risks, benefits and other options for treatment, the patient has consented to  Procedure(s): LAPAROSCOPY DIAGNOSTIC (N/A) WHIPPLE PROCEDURE (N/A) as a surgical intervention .  The patient's history has been reviewed, patient examined, no change in status, stable for surgery.  I have reviewed the patient's chart and labs.  Questions were answered to the patient's satisfaction.     Annalynn Centanni

## 2016-08-22 LAB — COMPREHENSIVE METABOLIC PANEL
ALK PHOS: 69 U/L (ref 38–126)
ALT: 118 U/L — AB (ref 17–63)
AST: 101 U/L — AB (ref 15–41)
Albumin: 2.8 g/dL — ABNORMAL LOW (ref 3.5–5.0)
Anion gap: 4 — ABNORMAL LOW (ref 5–15)
BUN: 8 mg/dL (ref 6–20)
CALCIUM: 8.1 mg/dL — AB (ref 8.9–10.3)
CO2: 26 mmol/L (ref 22–32)
CREATININE: 0.65 mg/dL (ref 0.61–1.24)
Chloride: 109 mmol/L (ref 101–111)
GFR calc non Af Amer: >60 mL/min (ref >60)
Glucose, Bld: 161 mg/dL — ABNORMAL HIGH (ref 65–99)
Potassium: 3.9 mmol/L (ref 3.5–5.1)
Sodium: 139 mmol/L (ref 135–145)
Total Bilirubin: 0.6 mg/dL (ref 0.3–1.2)
Total Protein: 5 g/dL — ABNORMAL LOW (ref 6.5–8.1)

## 2016-08-22 LAB — GLUCOSE, CAPILLARY
GLUCOSE-CAPILLARY: 133 mg/dL — AB (ref 65–99)
GLUCOSE-CAPILLARY: 150 mg/dL — AB (ref 65–99)
Glucose-Capillary: 132 mg/dL — ABNORMAL HIGH (ref 65–99)

## 2016-08-22 LAB — PREPARE PLATELET PHERESIS
Unit division: 0
Unit division: 0

## 2016-08-22 LAB — MAGNESIUM: MAGNESIUM: 1.5 mg/dL — AB (ref 1.7–2.4)

## 2016-08-22 LAB — CBC
HEMATOCRIT: 30.2 % — AB (ref 39.0–52.0)
Hemoglobin: 9.7 g/dL — ABNORMAL LOW (ref 13.0–17.0)
MCH: 25.3 pg — ABNORMAL LOW (ref 26.0–34.0)
MCHC: 32.1 g/dL (ref 30.0–36.0)
MCV: 78.6 fL (ref 78.0–100.0)
Platelets: 227 10*3/uL (ref 150–400)
RBC: 3.84 MIL/uL — AB (ref 4.22–5.81)
RDW: 15.5 % (ref 11.5–15.5)
WBC: 12.3 10*3/uL — AB (ref 4.0–10.5)

## 2016-08-22 LAB — POCT I-STAT 4, (NA,K, GLUC, HGB,HCT)
Glucose, Bld: 204 mg/dL — ABNORMAL HIGH (ref 65–99)
HCT: 29 % — ABNORMAL LOW (ref 39.0–52.0)
Hemoglobin: 9.9 g/dL — ABNORMAL LOW (ref 13.0–17.0)
POTASSIUM: 3.6 mmol/L (ref 3.5–5.1)
SODIUM: 139 mmol/L (ref 135–145)

## 2016-08-22 LAB — PROTIME-INR
INR: 1.2
Prothrombin Time: 15.3 seconds — ABNORMAL HIGH (ref 11.4–15.2)

## 2016-08-22 LAB — PHOSPHORUS: Phosphorus: 4 mg/dL (ref 2.5–4.6)

## 2016-08-22 MED ORDER — HYDRALAZINE HCL 20 MG/ML IJ SOLN
20.0000 mg | INTRAMUSCULAR | Status: DC | PRN
  Administered 2016-08-22 – 2016-08-24 (×3): 20 mg via INTRAVENOUS
  Filled 2016-08-22 (×3): qty 1

## 2016-08-22 NOTE — Progress Notes (Signed)
1 Day Post-Op  Subjective: Pain controlled.  Some hypertension overnight.    Objective: Vital signs in last 24 hours: Temp:  [98.1 F (36.7 C)-98.9 F (37.2 C)] 98.5 F (36.9 C) (01/12 0800) Pulse Rate:  [95-116] 95 (01/12 0700) Resp:  [9-21] 13 (01/12 0800) BP: (113-147)/(70-86) 127/86 (01/12 0700) SpO2:  [98 %-100 %] 100 % (01/12 0800) Arterial Line BP: (119-151)/(67-74) 132/70 (01/11 1600)    Intake/Output from previous day: 01/11 0701 - 01/12 0700 In: 5633.3 [I.V.:4906.7; IV Piggyback:426.7] Out: 1490 [Urine:1000; Emesis/NG output:10; Drains:180; Blood:300] Intake/Output this shift: No intake/output data recorded.  General appearance: alert, cooperative and mild distress Resp: breathing comfortably Cardio: regular rate and rhythm GI: soft, approp tender.  non distended.  drains serosang. Extremities: extremities normal, atraumatic, no cyanosis or edema  Lab Results:   Recent Labs  08/21/16 2010 08/22/16 0430  WBC 16.3* 12.3*  HGB 10.2* 9.7*  HCT 31.0* 30.2*  PLT 232 227   BMET  Recent Labs  08/21/16 1034 08/21/16 2010 08/22/16 0430  NA 139  --  139  K 3.0*  --  3.9  CL  --   --  109  CO2  --   --  26  GLUCOSE  --   --  161*  BUN  --   --  8  CREATININE  --  0.74 0.65  CALCIUM  --   --  8.1*   PT/INR  Recent Labs  08/22/16 0430  LABPROT 15.3*  INR 1.20   ABG  Recent Labs  08/21/16 1034  PHART 7.462*  HCO3 24.6    Studies/Results: Dg Abd Portable 1v  Result Date: 08/21/2016 CLINICAL DATA:  61 y/o  M; intraoperative needle miscount. EXAM: PORTABLE ABDOMEN - 1 VIEW COMPARISON:  None. FINDINGS: Multiple drains, skin staples, and surgical clips are present. No needle is identified. Postoperative pneumoperitoneum. Unremarkable bowel gas pattern. No acute osseous abnormality identified. IMPRESSION: Multiple drains, skin staples, and surgical clips are present. No needle is identified. These results were called by telephone at the time of  interpretation on 08/21/2016 at 2:20 pm to Dr. Stark Klein , who verbally acknowledged these results. Electronically Signed   By: Kristine Garbe M.D.   On: 08/21/2016 14:20    Anti-infectives: Anti-infectives    Start     Dose/Rate Route Frequency Ordered Stop   08/21/16 2000  ceFAZolin (ANCEF) IVPB 2g/100 mL premix     2 g 200 mL/hr over 30 Minutes Intravenous Every 8 hours 08/21/16 1746 08/21/16 2107   08/21/16 0607  ceFAZolin (ANCEF) IVPB 2g/100 mL premix     2 g 200 mL/hr over 30 Minutes Intravenous On call to O.R. 08/21/16 0607 08/21/16 1139      Assessment/Plan: s/p Procedure(s): LAPAROSCOPY DIAGNOSTIC (N/A) WHIPPLE PROCEDURE (N/A) Continue foley due to urinary output monitoring  Ambulate HTN - add hydralazine. Ambulate. Stepdown status. D/c NGT 6 am tomorrow. Start ice chips and sips of clears tomorrow.   LOS: 1 day    [facility] 08/22/2016

## 2016-08-23 LAB — CBC
HCT: 28.9 % — ABNORMAL LOW (ref 39.0–52.0)
Hemoglobin: 9.3 g/dL — ABNORMAL LOW (ref 13.0–17.0)
MCH: 24.9 pg — ABNORMAL LOW (ref 26.0–34.0)
MCHC: 32.2 g/dL (ref 30.0–36.0)
MCV: 77.3 fL — ABNORMAL LOW (ref 78.0–100.0)
PLATELETS: 235 10*3/uL (ref 150–400)
RBC: 3.74 MIL/uL — AB (ref 4.22–5.81)
RDW: 15.2 % (ref 11.5–15.5)
WBC: 11 10*3/uL — ABNORMAL HIGH (ref 4.0–10.5)

## 2016-08-23 LAB — GLUCOSE, CAPILLARY
GLUCOSE-CAPILLARY: 142 mg/dL — AB (ref 65–99)
GLUCOSE-CAPILLARY: 190 mg/dL — AB (ref 65–99)
Glucose-Capillary: 132 mg/dL — ABNORMAL HIGH (ref 65–99)
Glucose-Capillary: 161 mg/dL — ABNORMAL HIGH (ref 65–99)
Glucose-Capillary: 225 mg/dL — ABNORMAL HIGH (ref 65–99)
Glucose-Capillary: 113 mg/dL — ABNORMAL HIGH (ref 65–99)
Glucose-Capillary: 147 mg/dL — ABNORMAL HIGH (ref 65–99)
Glucose-Capillary: 124 mg/dL — ABNORMAL HIGH (ref 65–99)
Glucose-Capillary: 88 mg/dL (ref 65–99)
Glucose-Capillary: 62 mg/dL — ABNORMAL LOW (ref 65–99)

## 2016-08-23 LAB — MAGNESIUM: MAGNESIUM: 1.4 mg/dL — AB (ref 1.7–2.4)

## 2016-08-23 LAB — COMPREHENSIVE METABOLIC PANEL
ALBUMIN: 2.7 g/dL — AB (ref 3.5–5.0)
ALT: 85 U/L — ABNORMAL HIGH (ref 17–63)
AST: 48 U/L — ABNORMAL HIGH (ref 15–41)
Alkaline Phosphatase: 68 U/L (ref 38–126)
Anion gap: 7 (ref 5–15)
BUN: 6 mg/dL (ref 6–20)
CHLORIDE: 105 mmol/L (ref 101–111)
CO2: 26 mmol/L (ref 22–32)
Calcium: 8.2 mg/dL — ABNORMAL LOW (ref 8.9–10.3)
Creatinine, Ser: 0.52 mg/dL — ABNORMAL LOW (ref 0.61–1.24)
GFR calc Af Amer: >60 mL/min (ref >60)
GFR calc non Af Amer: >60 mL/min (ref >60)
GLUCOSE: 125 mg/dL — AB (ref 65–99)
POTASSIUM: 3.3 mmol/L — AB (ref 3.5–5.1)
SODIUM: 138 mmol/L (ref 135–145)
Total Bilirubin: 0.4 mg/dL (ref 0.3–1.2)
Total Protein: 5.4 g/dL — ABNORMAL LOW (ref 6.5–8.1)

## 2016-08-23 LAB — PHOSPHORUS: Phosphorus: 1.9 mg/dL — ABNORMAL LOW (ref 2.5–4.6)

## 2016-08-23 MED ORDER — DEXTROSE 50 % IV SOLN
INTRAVENOUS | Status: AC
  Administered 2016-08-23: 25 mL via INTRAVENOUS
  Filled 2016-08-23: qty 50

## 2016-08-23 MED ORDER — METOPROLOL TARTRATE 5 MG/5ML IV SOLN: 5.0000 mg | Freq: Four times a day (QID) | INTRAVENOUS | Status: DC | PRN

## 2016-08-23 MED ORDER — DEXTROSE 50 % IV SOLN
25.0000 mL | Freq: Once | INTRAVENOUS | Status: AC
  Administered 2016-08-23: 25 mL via INTRAVENOUS

## 2016-08-23 MED ORDER — HYDROMORPHONE HCL 1 MG/ML IJ SOLN
0.5000 mg | INTRAMUSCULAR | Status: DC | PRN
  Administered 2016-08-23 – 2016-08-24 (×2): 1 mg via INTRAVENOUS
  Filled 2016-08-23 (×2): qty 1

## 2016-08-23 MED ORDER — METOPROLOL TARTRATE 5 MG/5ML IV SOLN
2.5000 mg | Freq: Four times a day (QID) | INTRAVENOUS | Status: DC
  Administered 2016-08-23 – 2016-08-26 (×13): 2.5 mg via INTRAVENOUS
  Filled 2016-08-23 (×13): qty 5

## 2016-08-23 NOTE — Progress Notes (Signed)
Post-Op Whipple Subjective: Min nausea, NG out.  Tachy overnight.  Good UOP.  Pain controlled.  PCA bothering him.  Objective: Vital signs in last 24 hours: Temp:  [98.5 F (36.9 C)-99.9 F (37.7 C)] 99.9 F (37.7 C) (01/13 0400) Pulse Rate:  [96-133] 120 (01/13 0600) Resp:  [10-23] 18 (01/13 0600) BP: (115-162)/(62-87) 159/69 (01/13 0314) SpO2:  [97 %-100 %] 99 % (01/13 0600)   Intake/Output from previous day: 01/12 0701 - 01/13 0700 In: 2400 [I.V.:2400] Out: 1790 [Urine:1260; Emesis/NG output:250; Drains:280] Intake/Output this shift: No intake/output data recorded.   General appearance: alert and cooperative GI: soft, appropriately tender, JP: SS fluid  Incision: no significant drainage  Lab Results:   Recent Labs  08/22/16 0430 08/23/16 0358  WBC 12.3* 11.0*  HGB 9.7* 9.3*  HCT 30.2* 28.9*  PLT 227 235   BMET  Recent Labs  08/22/16 0430 08/23/16 0358  NA 139 138  K 3.9 3.3*  CL 109 105  CO2 26 26  GLUCOSE 161* 125*  BUN 8 6  CREATININE 0.65 0.52*  CALCIUM 8.1* 8.2*   PT/INR  Recent Labs  08/22/16 0430  LABPROT 15.3*  INR 1.20   ABG  Recent Labs  08/21/16 1034  PHART 7.462*  HCO3 24.6    MEDS, Scheduled . enoxaparin (LOVENOX) injection  40 mg Subcutaneous Q24H  . insulin aspart  0-15 Units Subcutaneous Q4H  . insulin glargine  15 Units Subcutaneous Daily  . pantoprazole (PROTONIX) IV  40 mg Intravenous QHS    Studies/Results: Dg Abd Portable 1v  Result Date: 08/21/2016 CLINICAL DATA:  61 y/o  M; intraoperative needle miscount. EXAM: PORTABLE ABDOMEN - 1 VIEW COMPARISON:  None. FINDINGS: Multiple drains, skin staples, and surgical clips are present. No needle is identified. Postoperative pneumoperitoneum. Unremarkable bowel gas pattern. No acute osseous abnormality identified. IMPRESSION: Multiple drains, skin staples, and surgical clips are present. No needle is identified. These results were called by telephone at the time of  interpretation on 08/21/2016 at 2:20 pm to Dr. Stark Klein , who verbally acknowledged these results. Electronically Signed   By: Kristine Garbe M.D.   On: 08/21/2016 14:20    Assessment: s/p Procedure(s): LAPAROSCOPY DIAGNOSTIC WHIPPLE PROCEDURE Patient Active Problem List   Diagnosis Date Noted  . Adenocarcinoma of head of pancreas ([place]) 08/21/2016  . Port catheter in place 04/08/2016  . Primary pancreatic adenocarcinoma ([place]) 03/28/2016  . Epigastric pain 03/12/2016  . AKI (acute kidney injury) ([place]) 03/12/2016  . Diabetes mellitus with complication ([place]) 123XX123  . ETOH abuse 03/12/2016  . Sinus tachycardia 03/12/2016    Expected post op course.  Plan: d/c foley, uop good Tachycardia: appears reactive.  No signs of hypovolemia or symptomatic anemia.  Cont SDU today to monitor Sips of clears today D/C PCA    LOS: 2 days     .Rosario Adie, [facility], [place]   08/23/2016 8:27 AM

## 2016-08-24 LAB — CBC
HEMATOCRIT: 27.4 % — AB (ref 39.0–52.0)
HEMOGLOBIN: 9 g/dL — AB (ref 13.0–17.0)
MCH: 25.6 pg — AB (ref 26.0–34.0)
MCHC: 32.8 g/dL (ref 30.0–36.0)
MCV: 77.8 fL — AB (ref 78.0–100.0)
Platelets: 202 10*3/uL (ref 150–400)
RBC: 3.52 MIL/uL — ABNORMAL LOW (ref 4.22–5.81)
RDW: 15.3 % (ref 11.5–15.5)
WBC: 8.2 10*3/uL (ref 4.0–10.5)

## 2016-08-24 LAB — GLUCOSE, CAPILLARY
GLUCOSE-CAPILLARY: 115 mg/dL — AB (ref 65–99)
Glucose-Capillary: 101 mg/dL — ABNORMAL HIGH (ref 65–99)
Glucose-Capillary: 126 mg/dL — ABNORMAL HIGH (ref 65–99)
Glucose-Capillary: 74 mg/dL (ref 65–99)
Glucose-Capillary: 77 mg/dL (ref 65–99)

## 2016-08-24 LAB — COMPREHENSIVE METABOLIC PANEL
ALK PHOS: 70 U/L (ref 38–126)
ALT: 54 U/L (ref 17–63)
ANION GAP: 8 (ref 5–15)
AST: 24 U/L (ref 15–41)
Albumin: 2.6 g/dL — ABNORMAL LOW (ref 3.5–5.0)
BILIRUBIN TOTAL: 0.5 mg/dL (ref 0.3–1.2)
BUN: <5 mg/dL — ABNORMAL LOW (ref 6–20)
CALCIUM: 8.2 mg/dL — AB (ref 8.9–10.3)
CO2: 25 mmol/L (ref 22–32)
Chloride: 104 mmol/L (ref 101–111)
Creatinine, Ser: 0.55 mg/dL — ABNORMAL LOW (ref 0.61–1.24)
GFR calc Af Amer: >60 mL/min (ref >60)
GLUCOSE: 118 mg/dL — AB (ref 65–99)
Potassium: 3 mmol/L — ABNORMAL LOW (ref 3.5–5.1)
Sodium: 137 mmol/L (ref 135–145)
TOTAL PROTEIN: 5.2 g/dL — AB (ref 6.5–8.1)

## 2016-08-24 LAB — PHOSPHORUS: Phosphorus: 2.6 mg/dL (ref 2.5–4.6)

## 2016-08-24 LAB — MAGNESIUM: MAGNESIUM: 1.4 mg/dL — AB (ref 1.7–2.4)

## 2016-08-24 MED ORDER — HYDROMORPHONE HCL 2 MG/ML IJ SOLN
0.5000 mg | INTRAMUSCULAR | Status: DC | PRN
  Administered 2016-08-24 – 2016-09-03 (×32): 1 mg via INTRAVENOUS
  Filled 2016-08-24 (×32): qty 1

## 2016-08-24 MED ORDER — POTASSIUM CHLORIDE CRYS ER 20 MEQ PO TBCR
40.0000 meq | EXTENDED_RELEASE_TABLET | Freq: Once | ORAL | Status: AC
  Administered 2016-08-24: 40 meq via ORAL
  Filled 2016-08-24: qty 2

## 2016-08-24 NOTE — Progress Notes (Signed)
Post-Op Whipple Subjective: Min nausea, tolerating sips of clears.  Less tachy overnight.  Good UOP.  Pain controlled.   Objective: Vital signs in last 24 hours: Temp:  [98.6 F (37 C)-99.3 F (37.4 C)] 99 F (37.2 C) (01/14 0400) Pulse Rate:  [90-123] 99 (01/14 0600) Resp:  [15-25] 19 (01/14 0600) BP: (141-191)/(68-102) 164/85 (01/14 0600) SpO2:  [98 %-100 %] 99 % (01/14 0600) Weight:  [77.3 kg (170 lb 6.7 oz)] 77.3 kg (170 lb 6.7 oz) (01/14 0000)   Intake/Output from previous day: 01/13 0701 - 01/14 0700 In: 1987.5 [I.V.:1867.5] Out: [id number] [Urine:875] Intake/Output this shift: No intake/output data recorded.   General appearance: alert and cooperative GI: soft, appropriately tender, JP: SS fluid  Incision: no significant drainage  Lab Results:   Recent Labs  08/22/16 0430 08/23/16 0358  WBC 12.3* 11.0*  HGB 9.7* 9.3*  HCT 30.2* 28.9*  PLT 227 235   BMET  Recent Labs  08/22/16 0430 08/23/16 0358  NA 139 138  K 3.9 3.3*  CL 109 105  CO2 26 26  GLUCOSE 161* 125*  BUN 8 6  CREATININE 0.65 0.52*  CALCIUM 8.1* 8.2*   PT/INR  Recent Labs  08/22/16 0430  LABPROT 15.3*  INR 1.20   ABG  Recent Labs  08/21/16 1034  PHART 7.462*  HCO3 24.6    MEDS, Scheduled . enoxaparin (LOVENOX) injection  40 mg Subcutaneous Q24H  . insulin aspart  0-15 Units Subcutaneous Q4H  . insulin glargine  15 Units Subcutaneous Daily  . metoprolol  2.5 mg Intravenous Q6H  . pantoprazole (PROTONIX) IV  40 mg Intravenous QHS    Studies/Results: No results found.  Assessment: s/p Procedure(s): LAPAROSCOPY DIAGNOSTIC WHIPPLE PROCEDURE Patient Active Problem List   Diagnosis Date Noted  . Adenocarcinoma of head of pancreas ([place]) 08/21/2016  . Port catheter in place 04/08/2016  . 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 Epigastric pain 03/12/2016  . AKI (acute kidney injury) ([place]) 03/12/2016  .  Diabetes mellitus with complication ([place]) 123XX123  . ETOH abuse 03/12/2016  . Sinus tachycardia 03/12/2016    Expected post op course.  Plan: Will start CLD Transfer to floor     LOS: 3 days     .Rosario Adie, [facility], [place]   08/24/2016 7:42 AM

## 2016-08-24 NOTE — Progress Notes (Signed)
Recently reconciled order for cardiac monitoring with Dr. Marcello Moores via phone. Order received to discontinue cardiac monitoring.

## 2016-08-24 NOTE — Progress Notes (Signed)
On q pain pump came out.  Informed Dr. Marcello Moores.  Irven Baltimore, RN

## 2016-08-25 ENCOUNTER — Encounter: Payer: Self-pay | Admitting: Hematology

## 2016-08-25 LAB — COMPREHENSIVE METABOLIC PANEL
ALT: 42 U/L (ref 17–63)
ANION GAP: 6 (ref 5–15)
AST: 19 U/L (ref 15–41)
Albumin: 2.5 g/dL — ABNORMAL LOW (ref 3.5–5.0)
Alkaline Phosphatase: 67 U/L (ref 38–126)
BUN: <5 mg/dL — ABNORMAL LOW (ref 6–20)
CHLORIDE: 103 mmol/L (ref 101–111)
CO2: 30 mmol/L (ref 22–32)
Calcium: 8.2 mg/dL — ABNORMAL LOW (ref 8.9–10.3)
Creatinine, Ser: 0.59 mg/dL — ABNORMAL LOW (ref 0.61–1.24)
GFR calc non Af Amer: >60 mL/min (ref >60)
Glucose, Bld: 99 mg/dL (ref 65–99)
POTASSIUM: 3.3 mmol/L — AB (ref 3.5–5.1)
Sodium: 139 mmol/L (ref 135–145)
TOTAL PROTEIN: 5.3 g/dL — AB (ref 6.5–8.1)
Total Bilirubin: 0.4 mg/dL (ref 0.3–1.2)

## 2016-08-25 LAB — PREPARE FRESH FROZEN PLASMA
UNIT DIVISION: 0
Unit division: 0

## 2016-08-25 LAB — TYPE AND SCREEN
BLOOD PRODUCT EXPIRATION DATE: 201802022359
BLOOD PRODUCT EXPIRATION DATE: 201802022359
Blood Product Expiration Date: 201801242359
Blood Product Expiration Date: 201802022359
ISSUE DATE / TIME: 201801110949
ISSUE DATE / TIME: 201801121408
UNIT TYPE AND RH: 7300
UNIT TYPE AND RH: 7300
UNIT TYPE AND RH: 7300
Unit Type and Rh: 7300

## 2016-08-25 LAB — CBC
HEMATOCRIT: 26.9 % — AB (ref 39.0–52.0)
Hemoglobin: 8.8 g/dL — ABNORMAL LOW (ref 13.0–17.0)
MCH: 25.5 pg — ABNORMAL LOW (ref 26.0–34.0)
MCHC: 32.7 g/dL (ref 30.0–36.0)
MCV: 78 fL (ref 78.0–100.0)
PLATELETS: 216 10*3/uL (ref 150–400)
RBC: 3.45 MIL/uL — ABNORMAL LOW (ref 4.22–5.81)
RDW: 15.2 % (ref 11.5–15.5)
WBC: 6.1 10*3/uL (ref 4.0–10.5)

## 2016-08-25 LAB — GLUCOSE, CAPILLARY
GLUCOSE-CAPILLARY: 102 mg/dL — AB (ref 65–99)
GLUCOSE-CAPILLARY: 146 mg/dL — AB (ref 65–99)
GLUCOSE-CAPILLARY: 70 mg/dL (ref 65–99)
GLUCOSE-CAPILLARY: 76 mg/dL (ref 65–99)
GLUCOSE-CAPILLARY: 94 mg/dL (ref 65–99)
Glucose-Capillary: 162 mg/dL — ABNORMAL HIGH (ref 65–99)
Glucose-Capillary: 83 mg/dL (ref 65–99)

## 2016-08-25 LAB — PHOSPHORUS: PHOSPHORUS: 2.9 mg/dL (ref 2.5–4.6)

## 2016-08-25 LAB — MAGNESIUM: MAGNESIUM: 1.5 mg/dL — AB (ref 1.7–2.4)

## 2016-08-25 MED ORDER — GLUCERNA 1.2 CAL PO LIQD
1000.0000 mL | ORAL | Status: DC
  Administered 2016-08-25 – 2016-08-26 (×2): 1000 mL
  Filled 2016-08-25 (×2): qty 1000

## 2016-08-25 MED ORDER — POTASSIUM CHLORIDE 20 MEQ/15ML (10%) PO SOLN
40.0000 meq | Freq: Every day | ORAL | Status: DC
  Administered 2016-08-25 – 2016-08-27 (×3): 40 meq via ORAL
  Filled 2016-08-25 (×4): qty 30

## 2016-08-25 MED ORDER — GLUCERNA 1.2 CAL PO LIQD
1000.0000 mL | ORAL | Status: DC
  Filled 2016-08-25: qty 1000

## 2016-08-25 NOTE — Progress Notes (Signed)
Tube feed started at 20 ml/hr dressings removed and replaced per order

## 2016-08-25 NOTE — Progress Notes (Signed)
Called pt to adv that paperwork was ready for p/u, requested that it be faxed to company which I did

## 2016-08-25 NOTE — Progress Notes (Signed)
Post-Op Whipple Subjective: Tolerated clear liquids.  No n/v.  PCA off.    Objective: Vital signs in last 24 hours: Temp:  [98.1 F (36.7 C)-100.1 F (37.8 C)] 98.6 F (37 C) (01/15 1503) Pulse Rate:  [94-100] 96 (01/15 1503) Resp:  [16-22] 16 (01/15 1503) BP: (130-151)/(75-80) 130/77 (01/15 1503) SpO2:  [100 %] 100 % (01/15 1503)   Intake/Output from previous day: 01/14 0701 - 01/15 0700 In: 1860 [P.O.:710; I.V.:1150] Out: [id number] [Urine:1070; Drains:350] Intake/Output this shift: Total I/O In: 920 [P.O.:720; I.V.:200] Out: 700 [Urine:600; Drains:100]   General appearance: alert and cooperative GI: soft, appropriately tender, JP: SS fluid  Incision: dressing with minimal staining.    Lab Results:   Recent Labs  08/24/16 1028 08/25/16 0500  WBC 8.2 6.1  HGB 9.0* 8.8*  HCT 27.4* 26.9*  PLT 202 216   BMET  Recent Labs  08/24/16 1028 08/25/16 0500  NA 137 139  K 3.0* 3.3*  CL 104 103  CO2 25 30  GLUCOSE 118* 99  BUN <5* <5*  CREATININE 0.55* 0.59*  CALCIUM 8.2* 8.2*   PT/INR No results for input(s): LABPROT, INR in the last 72 hours. ABG No results for input(s): PHART, HCO3 in the last 72 hours.  Invalid input(s): PCO2, PO2  MEDS, Scheduled . enoxaparin (LOVENOX) injection  40 mg Subcutaneous Q24H  . insulin aspart  0-15 Units Subcutaneous Q4H  . insulin glargine  15 Units Subcutaneous Daily  . metoprolol  2.5 mg Intravenous Q6H  . pantoprazole (PROTONIX) IV  40 mg Intravenous QHS  . potassium chloride  40 mEq Oral Daily    Studies/Results: No results found.  Assessment: s/p Procedure(s): LAPAROSCOPY DIAGNOSTIC WHIPPLE PROCEDURE Patient Active Problem List   Diagnosis Date Noted  . Adenocarcinoma of head of pancreas ([place]) 08/21/2016  . Port catheter in place 04/08/2016  . 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 Epigastric pain 03/12/2016  . AKI (acute kidney injury)  ([place]) 03/12/2016  . Diabetes mellitus with complication ([place]) 123XX123  . ETOH abuse 03/12/2016  . Sinus tachycardia 03/12/2016    Expected post op course.  Plan: Full liquid diet. HCT drifting - chronic anemia due to cancer, acute blood loss anemia, dilutional anemia. Recheck in AM. Fingersticks OK with lantus, SSI.   Remove dressings. Start tube feeds.    LOS: 4 days      [facility], [place] [phone]   08/25/2016 3:41 PM

## 2016-08-26 LAB — GLUCOSE, CAPILLARY
GLUCOSE-CAPILLARY: 125 mg/dL — AB (ref 65–99)
GLUCOSE-CAPILLARY: 213 mg/dL — AB (ref 65–99)
GLUCOSE-CAPILLARY: 112 mg/dL — AB (ref 65–99)
GLUCOSE-CAPILLARY: 156 mg/dL — AB (ref 65–99)
Glucose-Capillary: 111 mg/dL — ABNORMAL HIGH (ref 65–99)

## 2016-08-26 LAB — CBC
HEMATOCRIT: 28.2 % — AB (ref 39.0–52.0)
HEMOGLOBIN: 9.2 g/dL — AB (ref 13.0–17.0)
MCH: 25.5 pg — ABNORMAL LOW (ref 26.0–34.0)
MCHC: 32.6 g/dL (ref 30.0–36.0)
MCV: 78.1 fL (ref 78.0–100.0)
Platelets: 230 10*3/uL (ref 150–400)
RBC: 3.61 MIL/uL — ABNORMAL LOW (ref 4.22–5.81)
RDW: 15 % (ref 11.5–15.5)
WBC: 7.8 10*3/uL (ref 4.0–10.5)

## 2016-08-26 LAB — MAGNESIUM: Magnesium: 1.5 mg/dL — ABNORMAL LOW (ref 1.7–2.4)

## 2016-08-26 LAB — COMPREHENSIVE METABOLIC PANEL
ALBUMIN: 2.7 g/dL — AB (ref 3.5–5.0)
ALT: 34 U/L (ref 17–63)
ANION GAP: 5 (ref 5–15)
AST: 14 U/L — AB (ref 15–41)
Alkaline Phosphatase: 71 U/L (ref 38–126)
BILIRUBIN TOTAL: 0.4 mg/dL (ref 0.3–1.2)
CO2: 30 mmol/L (ref 22–32)
Calcium: 8.3 mg/dL — ABNORMAL LOW (ref 8.9–10.3)
Chloride: 100 mmol/L — ABNORMAL LOW (ref 101–111)
Creatinine, Ser: 0.63 mg/dL (ref 0.61–1.24)
GFR calc Af Amer: >60 mL/min (ref >60)
GFR calc non Af Amer: >60 mL/min (ref >60)
GLUCOSE: 127 mg/dL — AB (ref 65–99)
POTASSIUM: 3.6 mmol/L (ref 3.5–5.1)
SODIUM: 135 mmol/L (ref 135–145)
Total Protein: 5.5 g/dL — ABNORMAL LOW (ref 6.5–8.1)

## 2016-08-26 LAB — PHOSPHORUS: Phosphorus: 3.7 mg/dL (ref 2.5–4.6)

## 2016-08-26 MED ORDER — SIMETHICONE 80 MG PO CHEW
80.0000 mg | CHEWABLE_TABLET | Freq: Three times a day (TID) | ORAL | Status: DC
  Administered 2016-08-26 – 2016-09-04 (×26): 80 mg via ORAL
  Filled 2016-08-26 (×27): qty 1

## 2016-08-26 MED ORDER — ADULT MULTIVITAMIN W/MINERALS CH
1.0000 | ORAL_TABLET | Freq: Every day | ORAL | Status: DC
  Administered 2016-08-26 – 2016-09-04 (×10): 1 via ORAL
  Filled 2016-08-26 (×10): qty 1

## 2016-08-26 MED ORDER — PRO-STAT SUGAR FREE PO LIQD
30.0000 mL | Freq: Two times a day (BID) | ORAL | Status: DC
  Administered 2016-08-26 – 2016-08-28 (×5): 30 mL
  Filled 2016-08-26 (×6): qty 30

## 2016-08-26 MED ORDER — OXYCODONE HCL 5 MG PO TABS
5.0000 mg | ORAL_TABLET | ORAL | Status: DC | PRN
  Administered 2016-08-26: 5 mg via ORAL
  Administered 2016-08-27 – 2016-09-04 (×16): 10 mg via ORAL
  Filled 2016-08-26: qty 2
  Filled 2016-08-26: qty 1
  Filled 2016-08-26 (×17): qty 2

## 2016-08-26 MED ORDER — METOPROLOL TARTRATE 12.5 MG HALF TABLET: 12.5000 mg | ORAL_TABLET | Freq: Two times a day (BID) | ORAL | Status: DC | PRN

## 2016-08-26 MED ORDER — METHOCARBAMOL 500 MG PO TABS
500.0000 mg | ORAL_TABLET | Freq: Three times a day (TID) | ORAL | Status: DC
  Administered 2016-08-26 – 2016-08-29 (×9): 500 mg via ORAL
  Filled 2016-08-26 (×9): qty 1

## 2016-08-26 NOTE — Progress Notes (Signed)
Post-Op Whipple Subjective: Tolerating some full liquids, but does not have a big appetite.  Had some pain last night.    Objective: Vital signs in last 24 hours: Temp:  [98.6 F (37 C)-99.4 F (37.4 C)] 99.4 F (37.4 C) (01/16 0543) Pulse Rate:  [86-96] 86 (01/16 0543) Resp:  [16] 16 (01/16 0543) BP: (130-144)/(73-87) 144/87 (01/16 0543) SpO2:  [100 %] 100 % (01/16 0543)   Intake/Output from previous day: 01/15 0701 - 01/16 0700 In: 2465 [P.O.:1200; I.V.:1145; NG/GT:120] Out: 2590 [Urine:2350; Drains:240] Intake/Output this shift: Total I/O In: 820 [P.O.:360; I.V.:200; Other:100; NG/GT:160] Out: 565 [Urine:400; Drains:165]   General appearance: alert and cooperative GI: soft, appropriately tender, JP: SS fluid  Incision: c/d/i.  Lab Results:   Recent Labs  08/25/16 0500 08/26/16 0457  WBC 6.1 7.8  HGB 8.8* 9.2*  HCT 26.9* 28.2*  PLT 216 230   BMET  Recent Labs  08/25/16 0500 08/26/16 0457  NA 139 135  K 3.3* 3.6  CL 103 100*  CO2 30 30  GLUCOSE 99 127*  BUN <5* <5*  CREATININE 0.59* 0.63  CALCIUM 8.2* 8.3*   PT/INR No results for input(s): LABPROT, INR in the last 72 hours. ABG No results for input(s): PHART, HCO3 in the last 72 hours.  Invalid input(s): PCO2, PO2  MEDS, Scheduled . enoxaparin (LOVENOX) injection  40 mg Subcutaneous Q24H  . feeding supplement (GLUCERNA 1.2 CAL)  1,000 mL Per Tube Q24H  . feeding supplement (PRO-STAT SUGAR FREE 64)  30 mL Per Tube BID  . insulin aspart  0-15 Units Subcutaneous Q4H  . insulin glargine  15 Units Subcutaneous Daily  . methocarbamol  500 mg Oral TID  . metoprolol  2.5 mg Intravenous Q6H  . multivitamin with minerals  1 tablet Oral Daily  . pantoprazole (PROTONIX) IV  40 mg Intravenous QHS  . potassium chloride  40 mEq Oral Daily  . simethicone  80 mg Oral TID    Studies/Results: No results found.  Assessment: s/p Procedure(s): LAPAROSCOPY DIAGNOSTIC WHIPPLE PROCEDURE Patient Active  Problem List   Diagnosis Date Noted  . Adenocarcinoma of head of pancreas ([place]) 08/21/2016  . Port catheter in place 04/08/2016  . 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 Epigastric pain 03/12/2016  . AKI (acute kidney injury) ([place]) 03/12/2016  . Diabetes mellitus with complication ([place]) 123XX123  . ETOH abuse 03/12/2016  . Sinus tachycardia 03/12/2016    Expected post op course.  Plan: Soft diet. HCT drifting - chronic anemia due to cancer, acute blood loss anemia, dilutional anemia. Recheck in AM. Fingersticks OK with lantus, SSI.    Increase tube feeds.   Add oxycodone and robaxin.   Simethicone for gas pain.    LOS: 5 days      [facility], [place] [phone]   08/26/2016 1:44 PM

## 2016-08-26 NOTE — Progress Notes (Signed)
Initial Nutrition Assessment  DOCUMENTATION CODES:   Severe malnutrition in context of chronic illness  INTERVENTION:   Monitor magnesium, potassium, and phosphorus daily for at least 3 days, MD to replete as needed, as pt is at risk for refeeding syndrome given severe malnutrition, poor PO intake x >5 days, and low Mg/Phos levels .  Initiate Glucerna 1.2 @ 20 ml/hr via J-tube and increase by 10 ml every 24 hours to goal rate of 70 ml/hr.  30 ml Prostat BID.   Tube feeding regimen provides 2216 kcal (100% of needs), 130 grams of protein, and 1352 ml of H2O.   Provide Multivitamin with minerals daily Provide 100 mg Thiamine for at least 3 days as pt is at refeeding risk. Encourage sips of liquids as tolerated. RD to continue to follow  NUTRITION DIAGNOSIS:   Malnutrition related to chronic illness as evidenced by percent weight loss, energy intake < or equal to 75% for > or equal to 1 month, severe depletion of body fat, severe depletion of muscle mass.  GOAL:   Patient will meet greater than or equal to 90% of their needs  MONITOR:   PO intake, Labs, Weight trends, TF tolerance, Skin, I & O's  REASON FOR ASSESSMENT:   Consult Enteral/tube feeding initiation and management  ASSESSMENT:   61 year old male who presents for a follow-up for Pancreatic cancer. Patient was diagnosed with pancreatic cancer in August 2017. Presenting symptoms were jaundice, pain, and weight loss. He has been getting neoadjuvant treatment and is here to discuss possible surgery. Follow up imaging was favorable. He has had a difficult time with chemotherapy. He has had significant fatigue and weakness, but is still able to get around the house.  1/11: s/p Diagnostic laparoscopy Classic pancreaticoduodenectomy  Placement of pancreatic duct stent  Placement of jejunostomy feeding tube, 18 Fr red rubber catheter  Patient in room with RN and nursing student at bedside. Pt reports tolerating his  tube feeds so far. He is also sipping on liquids PO. So far today he has had some apple juice and an icee. Pt states that when his feeds initially started yesterday he was having some pain. Denies bloating or nausea. Pt reports he was not eating well prior to his surgery on 1/11. He reports bland tastes and lost interest in certain foods. Pt has not had a BM in 5 days as well. Per RN at bedside, pt has positive bowel sounds and is passing flatus. Will monitor for BM. Pt was drinking Glucerna shakes at home.   Pt currently receiving Glucerna 1.2 @ 20 ml/hr. Reviewed plan for advancement with RN and nursing student. Will advance slowly d/t refeeding risk. Goal rate is 70 ml/hr with 30 ml Prostat BID.  Pt reports UBW of 240 lb. Per chart, pt has lost 25 lb since 9/21 (13% wt loss x 4 months, significant for time frame). Nutrition-Focused physical exam completed. Findings are severe fat depletion, severe muscle depletion, and no edema.   Medications: IV Protonix daily, KCl solution daily, D5 and .45% NaCl w/ KCl infusion at 50 ml/hr -provides 204 kcal Labs reviewed: Low Mg Phos/K WNL  Diet Order:  Diet full liquid Room service appropriate? Yes; Fluid consistency: Thin  Skin:  Wound (see comment) (1/11 abdominal incision)  Last BM:  1/11  Height:   Ht Readings from Last 1 Encounters:  08/21/16 5\' 11"  (1.803 m)    Weight:   Wt Readings from Last 1 Encounters:  08/24/16 170 lb 6.7  oz (77.3 kg)    Ideal Body Weight:  78.2 kg  BMI:  Body mass index is 23.77 kg/m.  Estimated Nutritional Needs:   Kcal:  2200-2400  Protein:  110-120g  Fluid:  2.2-2.4L/day  EDUCATION NEEDS:   Education needs addressed  Clayton Bibles, MS, RD, LDN Pager: [phone] After Hours Pager: [phone]

## 2016-08-27 LAB — GLUCOSE, CAPILLARY
GLUCOSE-CAPILLARY: 130 mg/dL — AB (ref 65–99)
GLUCOSE-CAPILLARY: 81 mg/dL (ref 65–99)
GLUCOSE-CAPILLARY: 155 mg/dL — AB (ref 65–99)
GLUCOSE-CAPILLARY: 148 mg/dL — AB (ref 65–99)
GLUCOSE-CAPILLARY: 111 mg/dL — AB (ref 65–99)
GLUCOSE-CAPILLARY: 147 mg/dL — AB (ref 65–99)
Glucose-Capillary: 92 mg/dL (ref 65–99)

## 2016-08-27 MED ORDER — GLUCERNA 1.2 CAL PO LIQD
1000.0000 mL | ORAL | Status: DC
  Administered 2016-08-27: 1000 mL
  Filled 2016-08-27: qty 1000

## 2016-08-27 MED ORDER — PANTOPRAZOLE SODIUM 40 MG PO TBEC
40.0000 mg | DELAYED_RELEASE_TABLET | Freq: Every day | ORAL | Status: DC
  Administered 2016-08-27 – 2016-09-04 (×9): 40 mg via ORAL
  Filled 2016-08-27 (×9): qty 1

## 2016-08-27 NOTE — Progress Notes (Signed)
Nutrition Follow-up  DOCUMENTATION CODES:   Severe malnutrition in context of chronic illness  INTERVENTION:   Monitor magnesium, potassium, and phosphorus daily for at least 3 days, MD to replete as needed, as pt is at risk for refeeding syndrome given severe malnutrition, poor PO intake x >5 days, and low Mg/Phos levels .  Continue Glucerna 1.2 @ 30 ml/hr via J-tube and increase by 10 ml every 24 hours to goal rate of 70 ml/hr.  30 ml Prostat BID.   Tube feeding regimen provides 2216 kcal (100% of needs), 130 grams of protein, and 1352 ml of H2O.   Recommend laxative if patient unable to produce BM. Continue Multivitamin with minerals daily Recommend 100 mg Thiamine for at least 3 days as pt is at refeeding risk. Encourage sips of liquids as tolerated. RD to continue to follow   NUTRITION DIAGNOSIS:   Malnutrition related to chronic illness as evidenced by percent weight loss, energy intake < or equal to 75% for > or equal to 1 month, severe depletion of body fat, severe depletion of muscle mass.  Ongoing.  GOAL:   Patient will meet greater than or equal to 90% of their needs  Progressing.  MONITOR:   PO intake, Labs, Weight trends, TF tolerance, Skin, I & O's  REASON FOR ASSESSMENT:   Consult Enteral/tube feeding initiation and management  ASSESSMENT:   61 year old male who presents for a follow-up for Pancreatic cancer. Patient was diagnosed with pancreatic cancer in August 2017. Presenting symptoms were jaundice, pain, and weight loss. He has been getting neoadjuvant treatment and is here to discuss possible surgery. Follow up imaging was favorable. He has had a difficult time with chemotherapy. He has had significant fatigue and weakness, but is still able to get around the house.   Patient in room with breakfast tray. Pt ate 1/2 of a cheese and mushroom omelette this morning (150 kcal ,11g protein). Pt states he should not have ordered cheese d/t his  constipation. Pt may need laxative if does not have BM soon. Per RN, pt's tube feeding was not advanced as scheduled yesterday. Glucerna 1.2 was advanced to 30 ml/hr ~0900. Tube feeding can be advanced as scheduled at 1700 if pt is tolerating 30 ml/hr with no issues today. Reviewed plan with RN and patient.  Medications: Multivitamin with minerals daily, KCl solution daily, D5 and .45% NaCl w/ KCl infusion at 50 ml/hr -provides 204 kcal Labs reviewed: CBGs: 81-130  Diet Order:  DIET SOFT Room service appropriate? Yes; Fluid consistency: Thin  Skin:  Wound (see comment) (1/11 abdominal incision)  Last BM:  1/11  Height:   Ht Readings from Last 1 Encounters:  08/21/16 5\' 11"  (1.803 m)    Weight:   Wt Readings from Last 1 Encounters:  08/24/16 170 lb 6.7 oz (77.3 kg)    Ideal Body Weight:  78.2 kg  BMI:  Body mass index is 23.77 kg/m.  Estimated Nutritional Needs:   Kcal:  2200-2400  Protein:  110-120g  Fluid:  2.2-2.4L/day  EDUCATION NEEDS:   Education needs addressed  Clayton Bibles, MS, RD, LDN Pager: [phone] After Hours Pager: [phone]

## 2016-08-27 NOTE — Progress Notes (Signed)
PHARMACIST - PHYSICIAN COMMUNICATION  DR:   Barry Dienes  CONCERNING: IV to Oral Route Change Policy  RECOMMENDATION: This patient is receiving Protonix by the intravenous route.  Based on criteria approved by the Pharmacy and Therapeutics Committee, the intravenous medication(s) is/are being converted to the equivalent oral dose form(s).   DESCRIPTION: These criteria include:  The patient is eating (either orally or via tube) and/or has been taking other orally administered medications for a least 24 hours  The patient has no evidence of active gastrointestinal bleeding or impaired GI absorption (gastrectomy, short bowel, patient on TNA or NPO).  If you have questions about this conversion, please contact the Pharmacy Department  []   [phone] )  [facility] []   [phone] )  [facility] []   [phone] )  [facility] []   [phone] )  [facility] [x]   [phone] )  [facility]    08/27/2016 11:04 AM  Peggyann Juba, PharmD, BCPS

## 2016-08-27 NOTE — Progress Notes (Signed)
Pt complaining of pain and fullness after upping feeding to 40 ml/hr. Tender at tube site. Decreased to 32ml/hr. Will continue to monitor.

## 2016-08-27 NOTE — Progress Notes (Signed)
Post-Op [facility] Subjective: Pt did not get his tube feeds advanced yesterday due to misunderstanding of staff for order.  Still no BM.  Having flatus.  Eating some soft food PO.  Drains sent for lipase, pending.  Pt walking more.    Objective: Vital signs in last 24 hours: Temp:  [99 F (37.2 C)-99.4 F (37.4 C)] 99 F (37.2 C) (01/17 0431) Pulse Rate:  [102-103] 102 (01/17 0431) Resp:  [16] 16 (01/17 0431) BP: (137-152)/(60-97) 152/97 (01/17 0431) SpO2:  [100 %] 100 % (01/17 0431)   Intake/Output from previous day: 01/16 0701 - 01/17 0700 In: 2230 [P.O.:720; I.V.:950; NG/GT:460] Out: 1760 [Urine:1250; Drains:510] Intake/Output this shift: Total I/O In: -  Out: 850 [Urine:800; Drains:50]   General appearance: alert and cooperative GI: soft, appropriately tender, JP: SS fluid Breathing comfortably Incision: c/d/i.  Lab Results:   Recent Labs  08/25/16 0500 08/26/16 0457  WBC 6.1 7.8  HGB 8.8* 9.2*  HCT 26.9* 28.2*  PLT 216 230   BMET  Recent Labs  08/25/16 0500 08/26/16 0457  NA 139 135  K 3.3* 3.6  CL 103 100*  CO2 30 30  GLUCOSE 99 127*  BUN <5* <5*  CREATININE 0.59* 0.63  CALCIUM 8.2* 8.3*   PT/INR No results for input(s): LABPROT, INR in the last 72 hours. ABG No results for input(s): PHART, HCO3 in the last 72 hours.  Invalid input(s): PCO2, PO2  MEDS, Scheduled . enoxaparin (LOVENOX) injection  40 mg Subcutaneous Q24H  . feeding supplement (GLUCERNA 1.2 CAL)  1,000 mL Per Tube Q24H  . feeding supplement (PRO-STAT SUGAR FREE 64)  30 mL Per Tube BID  . insulin aspart  0-15 Units Subcutaneous Q4H  . insulin glargine  15 Units Subcutaneous Daily  . methocarbamol  500 mg Oral TID  . multivitamin with minerals  1 tablet Oral Daily  . pantoprazole  40 mg Oral Daily  . potassium chloride  40 mEq Oral Daily  . simethicone  80 mg Oral TID    Studies/Results: No results found.  Assessment: s/p Procedure(s): LAPAROSCOPY DIAGNOSTIC [facility]  PROCEDURE Patient Active Problem List   Diagnosis Date Noted  . Adenocarcinoma of head of pancreas ([place]) 08/21/2016  . Port catheter in place 04/08/2016  . 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 Epigastric pain 03/12/2016  . AKI (acute kidney injury) ([place]) 03/12/2016  . Diabetes mellitus with complication ([place]) 123XX123  . ETOH abuse 03/12/2016  . Sinus tachycardia 03/12/2016    Expected post op course.  Plan: Soft diet. chronic anemia due to cancer, acute blood loss anemia, dilutional anemia; stable.  Fingersticks OK with lantus, SSI.    Increase tube feeds to goal.   Oxycodone and robaxin.   Simethicone for gas pain. Care management consult for HH tube feeds.   If no BM by tomorrow, suppository.      LOS: 6 days      [facility], [place] [phone]   08/27/2016 12:07 PM

## 2016-08-28 LAB — GLUCOSE, CAPILLARY
GLUCOSE-CAPILLARY: 144 mg/dL — AB (ref 65–99)
GLUCOSE-CAPILLARY: 152 mg/dL — AB (ref 65–99)
GLUCOSE-CAPILLARY: 104 mg/dL — AB (ref 65–99)
GLUCOSE-CAPILLARY: 102 mg/dL — AB (ref 65–99)
Glucose-Capillary: 106 mg/dL — ABNORMAL HIGH (ref 65–99)
Glucose-Capillary: 118 mg/dL — ABNORMAL HIGH (ref 65–99)

## 2016-08-28 LAB — CBC
HCT: 29.6 % — ABNORMAL LOW (ref 39.0–52.0)
HEMOGLOBIN: 9.6 g/dL — AB (ref 13.0–17.0)
MCH: 25.5 pg — AB (ref 26.0–34.0)
MCHC: 32.4 g/dL (ref 30.0–36.0)
MCV: 78.7 fL (ref 78.0–100.0)
PLATELETS: 313 10*3/uL (ref 150–400)
RBC: 3.76 MIL/uL — AB (ref 4.22–5.81)
RDW: 15 % (ref 11.5–15.5)
WBC: 9.1 10*3/uL (ref 4.0–10.5)

## 2016-08-28 LAB — BASIC METABOLIC PANEL
ANION GAP: 8 (ref 5–15)
BUN: 7 mg/dL (ref 6–20)
CHLORIDE: 99 mmol/L — AB (ref 101–111)
CO2: 31 mmol/L (ref 22–32)
Calcium: 8.5 mg/dL — ABNORMAL LOW (ref 8.9–10.3)
Creatinine, Ser: 0.76 mg/dL (ref 0.61–1.24)
Glucose, Bld: 145 mg/dL — ABNORMAL HIGH (ref 65–99)
POTASSIUM: 4.1 mmol/L (ref 3.5–5.1)
SODIUM: 138 mmol/L (ref 135–145)

## 2016-08-28 LAB — PREALBUMIN: Prealbumin: 6.6 mg/dL — ABNORMAL LOW (ref 18–38)

## 2016-08-28 MED ORDER — POTASSIUM CHLORIDE CRYS ER 20 MEQ PO TBCR
40.0000 meq | EXTENDED_RELEASE_TABLET | Freq: Every day | ORAL | Status: DC
  Administered 2016-08-28 – 2016-09-04 (×8): 40 meq via ORAL
  Filled 2016-08-28 (×9): qty 2

## 2016-08-28 MED ORDER — GLUCERNA 1.2 CAL PO LIQD
1000.0000 mL | ORAL | Status: DC
  Administered 2016-08-28: 1000 mL
  Filled 2016-08-28: qty 1000

## 2016-08-28 MED ORDER — BISACODYL 10 MG RE SUPP
10.0000 mg | Freq: Once | RECTAL | Status: AC
  Administered 2016-08-28: 10 mg via RECTAL
  Filled 2016-08-28: qty 1

## 2016-08-28 MED ORDER — MAGNESIUM SULFATE 4 GM/100ML IV SOLN
4.0000 g | Freq: Once | INTRAVENOUS | Status: AC
  Administered 2016-08-28: 4 g via INTRAVENOUS
  Filled 2016-08-28: qty 100

## 2016-08-28 MED ORDER — POTASSIUM CHLORIDE CRYS ER 20 MEQ PO TBCR: 40.0000 meq | EXTENDED_RELEASE_TABLET | Freq: Every day | ORAL | Status: DC

## 2016-08-28 NOTE — Progress Notes (Signed)
Pt anxious, sweating and c/o severe abd pain and fullness.  States had some flatus.  Tube feeding decreased to 10cc/hr for now.  Call placed to dr. Rosendo Gros.  Return call at later time.  No new orders placed. Agreed for tube feeding to remain at 10cc/hr for the time being

## 2016-08-28 NOTE — Progress Notes (Signed)
Post-Op [facility] Subjective: Pt had a bad night.  He had sweats and severe abdominal pain.  Tube feeds turned off last night.  He passed a lot of gas and felt better.  Tube feeds restarted this AM at 10.  Still no BM.  RN just placed suppository.  Objective: Vital signs in last 24 hours: Temp:  [98.5 F (36.9 C)-99.1 F (37.3 C)] 99.1 F (37.3 C) (01/18 0525) Pulse Rate:  [90-105] 90 (01/18 0525) Resp:  [16] 16 (01/18 0525) BP: (121-127)/(54-85) 127/75 (01/18 0525) SpO2:  [100 %] 100 % (01/18 0525)   Intake/Output from previous day: 01/17 0701 - 01/18 0700 In: 2313.2 [P.O.:600; I.V.:1200; NG/GT:513.2] Out: 1800 [Urine:1200; Drains:600] Intake/Output this shift: Total I/O In: -  Out: 500 [Urine:400; Drains:100]   General appearance: alert and cooperative GI: soft, appropriately tender, JP: SS fluid Breathing comfortably Incision: c/d/i.   Lab Results:   Recent Labs  08/26/16 0457 08/28/16 0447  WBC 7.8 9.1  HGB 9.2* 9.6*  HCT 28.2* 29.6*  PLT 230 313   BMET  Recent Labs  08/26/16 0457 08/28/16 0447  NA 135 138  K 3.6 4.1  CL 100* 99*  CO2 30 31  GLUCOSE 127* 145*  BUN <5* 7  CREATININE 0.63 0.76  CALCIUM 8.3* 8.5*   PT/INR No results for input(s): LABPROT, INR in the last 72 hours. ABG No results for input(s): PHART, HCO3 in the last 72 hours.  Invalid input(s): PCO2, PO2  MEDS, Scheduled . bisacodyl  10 mg Rectal Once  . enoxaparin (LOVENOX) injection  40 mg Subcutaneous Q24H  . feeding supplement (GLUCERNA 1.2 CAL)  1,000 mL Per Tube Q24H  . feeding supplement (PRO-STAT SUGAR FREE 64)  30 mL Per Tube BID  . insulin aspart  0-15 Units Subcutaneous Q4H  . insulin glargine  15 Units Subcutaneous Daily  . methocarbamol  500 mg Oral TID  . multivitamin with minerals  1 tablet Oral Daily  . pantoprazole  40 mg Oral Daily  . potassium chloride  40 mEq Oral Daily  . simethicone  80 mg Oral TID    Studies/Results: No results  found.  Assessment: s/p Procedure(s): LAPAROSCOPY DIAGNOSTIC [facility]IP[facility] PROCEDURE Patient Active Problem List   Diagnosis Date Noted  . Adenocarcinoma of head of pancreas ([place]) 08/21/2016  . Port catheter in place 04/08/2016  . 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 Epigastric pain 03/12/2016  . AKI (acute kidney injury) ([place]) 03/12/2016  . Diabetes mellitus with complication ([place]) 123XX123  . ETOH abuse 03/12/2016  . Sinus tachycardia 03/12/2016     Plan: Soft diet as tolerated.  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  Fingersticks OK with lantus, SSI.    Hold on tube feeds at 20.   Suppository since no BM yet.   Oxycodone and robaxin.   Simethicone for gas pain. Care management consult for HH tube feeds.   Think he will start feeling a lot better once he has a good bowel movement.     LOS: 7 days      [facility], [place] [phone]   08/28/2016 12:59 PM

## 2016-08-28 NOTE — Progress Notes (Addendum)
Nutrition Follow-up  DOCUMENTATION CODES:   Severe malnutrition in context of chronic illness  INTERVENTION:   Monitor magnesium, potassium, and phosphorus daily for at least 3 days, MD to replete as needed, as pt is at risk for refeeding syndrome given severe malnutrition, poor PO intake x >5 days, and low Mg/Phos levels .  Continue Glucerna 1.2 @ 10 ml/hr, advance every 24 hours by 10 ml to goal rate of 70 ml/hr. 30 ml Prostat BID Tube feeding regimen provides 2216kcal (100% of needs), 130grams of protein, and 1353ml of H2O.   -Recommend suppository or laxative to initiate BM. -Continue Multivitamin with minerals daily -Recommend 100 mg Thiamine for at least 3 days as pt is at refeeding risk. -Encourage PO intake as tolerated -Addendum:Order for Calorie Count discontinued. -RD to continue to follow   NUTRITION DIAGNOSIS:   Malnutrition related to chronic illness as evidenced by percent weight loss, energy intake < or equal to 75% for > or equal to 1 month, severe depletion of body fat, severe depletion of muscle mass.  Ongoing.  GOAL:   Patient will meet greater than or equal to 90% of their needs  Not meeting.  MONITOR:   PO intake, Labs, Weight trends, TF tolerance, Skin, I & O's  ASSESSMENT:   61 year old male who presents for a follow-up for Pancreatic cancer. Patient was diagnosed with pancreatic cancer in August 2017. Presenting symptoms were jaundice, pain, and weight loss. He has been getting neoadjuvant treatment and is here to discuss possible surgery. Follow up imaging was favorable. He has had a difficult time with chemotherapy. He has had significant fatigue and weakness, but is still able to get around the house.   Patient's tube feed decreased to 10 ml/hr overnight d/t pt's c/o fullness and pain. Pt still has not had a BM. States he feels better once tube feeding was turned down. Noted in surgery note, plans for suppository if no BM today. If patient  is unable to tolerate Glucerna formulation after BM, RD will adjust formula to Osmolite which is fiber-free and lower in fat. Reviewed plan with RN. Per patient, he ate 100% of a spaghetti dinner yesterday for lunch. He did not want anything for dinner d/t discomfort. Per RN he has not eaten any breakfast this morning. Calorie Count details in separate note. Addendum: Calorie count order discontinued.  RD will continue to monitor for advancement, PO intake and tolerance.  Medications: Multivitamin with minerals daily, Protonix tablet daily, KCl solution daily, D5 and .45% NaCl w/ KCL infusion at 50 ml/hr -provides 204 kcal, IV Zofran PRN Labs reviewed: CBGs: 106-144  Diet Order:  DIET SOFT Room service appropriate? Yes; Fluid consistency: Thin  Skin:  Wound (see comment) (1/11 abdominal incision)  Last BM:  1/11  Height:   Ht Readings from Last 1 Encounters:  08/21/16 5\' 11"  (1.803 m)    Weight:   Wt Readings from Last 1 Encounters:  08/24/16 170 lb 6.7 oz (77.3 kg)    Ideal Body Weight:  78.2 kg  BMI:  Body mass index is 23.77 kg/m.  Estimated Nutritional Needs:   Kcal:  2200-2400  Protein:  110-120g  Fluid:  2.2-2.4L/day  EDUCATION NEEDS:   Education needs addressed  Clayton Bibles, MS, RD, LDN Pager: [phone] After Hours Pager: [phone]

## 2016-08-28 NOTE — Care Management Note (Addendum)
Case Management Note  Patient Details  Name: JIBREEL LORIG MRN: [id number] Date of Birth: [birth year]  Subjective/Objective:   61 y.o. M with fatigue, weakness related to Pancreatic Cancer. Admitted 08/21/2016. CM received consult for [facility] for TF, HHRN. Pt given list of [facility] agencies in [place] to discuss with spouse at his request as he is unfamiliar with any. Denies need for DME needed for mobility. Lives with spouse in private residence.                  Action/Plan:CM will continue to follow.    Expected Discharge Date:                  Expected Discharge Plan:  [place]  In-House Referral:  NA  Discharge planning Services  CM Consult  Post Acute Care Choice:  Home Health Choice offered to:  Patient, Spouse (Left list of [place] agencies with pt to discuss with spouse)  DME Arranged:  N/A DME Agency:     HH Arranged:  RN HH Agency:     Status of Service:  In process, will continue to follow  If discussed at Long Length of Stay Meetings, dates discussed:    Additional Comments:  Delrae Sawyers, RN 08/28/2016, 1:48 PM

## 2016-08-29 LAB — GLUCOSE, CAPILLARY
GLUCOSE-CAPILLARY: 122 mg/dL — AB (ref 65–99)
GLUCOSE-CAPILLARY: 80 mg/dL (ref 65–99)
Glucose-Capillary: 102 mg/dL — ABNORMAL HIGH (ref 65–99)
Glucose-Capillary: 114 mg/dL — ABNORMAL HIGH (ref 65–99)
Glucose-Capillary: 133 mg/dL — ABNORMAL HIGH (ref 65–99)
Glucose-Capillary: 83 mg/dL (ref 65–99)

## 2016-08-29 MED ORDER — GLUCERNA 1.2 CAL PO LIQD
1000.0000 mL | ORAL | Status: DC
Start: 2016-08-29 — End: 2016-08-29
  Filled 2016-08-29: qty 1000

## 2016-08-29 MED ORDER — METHOCARBAMOL 500 MG PO TABS
500.0000 mg | ORAL_TABLET | Freq: Three times a day (TID) | ORAL | Status: DC | PRN
  Administered 2016-09-03 (×2): 500 mg via ORAL
  Filled 2016-08-29 (×2): qty 1

## 2016-08-29 MED ORDER — OSMOLITE 1.5 CAL PO LIQD
1000.0000 mL | ORAL | Status: DC
  Filled 2016-08-29 (×7): qty 1000

## 2016-08-29 NOTE — Progress Notes (Signed)
Pt selected [facility]. Spoke with [facility] CM, will need to follow for [facility] availability if discharged over weekend.

## 2016-08-29 NOTE — Progress Notes (Signed)
Post-Op [facility] Subjective: Feeling a lot better.  Got suppository yesterday, had small BM.    Objective: Vital signs in last 24 hours: Temp:  [98 F (36.7 C)-98.9 F (37.2 C)] 98.3 F (36.8 C) (01/19 0415) Pulse Rate:  [77-98] 97 (01/19 0415) Resp:  [16-18] 18 (01/19 0415) BP: (119-134)/(61-87) 119/71 (01/19 0415) SpO2:  [97 %-100 %] 100 % (01/19 0415)   Intake/Output from previous day: 01/18 0701 - 01/19 0700 In: 1660 [P.O.:240; I.V.:1150; NG/GT:220; IV Piggyback:50] Out: [id number] [Urine:1300; Drains:350] Intake/Output this shift: No intake/output data recorded.   General appearance: alert and cooperative GI: soft, appropriately tender, JP: SS fluid Breathing comfortably Incision: c/d/i.   Lab Results:   Recent Labs  08/28/16 0447  WBC 9.1  HGB 9.6*  HCT 29.6*  PLT 313   BMET  Recent Labs  08/28/16 0447  NA 138  K 4.1  CL 99*  CO2 31  GLUCOSE 145*  BUN 7  CREATININE 0.76  CALCIUM 8.5*   PT/INR No results for input(s): LABPROT, INR in the last 72 hours. ABG No results for input(s): PHART, HCO3 in the last 72 hours.  Invalid input(s): PCO2, PO2  MEDS, Scheduled . enoxaparin (LOVENOX) injection  40 mg Subcutaneous Q24H  . feeding supplement (GLUCERNA 1.2 CAL)  1,000 mL Per Tube Q24H  . feeding supplement (PRO-STAT SUGAR FREE 64)  30 mL Per Tube BID  . insulin aspart  0-15 Units Subcutaneous Q4H  . insulin glargine  15 Units Subcutaneous Daily  . methocarbamol  500 mg Oral TID  . multivitamin with minerals  1 tablet Oral Daily  . pantoprazole  40 mg Oral Daily  . potassium chloride  40 mEq Oral Daily  . simethicone  80 mg Oral TID    Studies/Results: No results found.  Assessment: s/p Procedure(s): LAPAROSCOPY DIAGNOSTIC WHIPPLE PROCEDURE Patient Active Problem List   Diagnosis Date Noted  . Adenocarcinoma of head of pancreas ([place]) 08/21/2016  . Port catheter in place 04/08/2016  . cT2N1 pancreatic adenocarcinoma of the pancreatic head  s/p pancreaticoduodenectomy 08/21/2016 03/28/2016  . Epigastric pain 03/12/2016  . AKI (acute kidney injury) ([place]) 03/12/2016  . Diabetes mellitus with complication ([place]) 123XX123  . ETOH abuse 03/12/2016  . Sinus tachycardia 03/12/2016     Plan: Soft diet as tolerated.  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  DM - Fingersticks OK with lantus, SSI.    Severe protein calorie malnutrition.  Try to advance tube feeds again today.     Oxycodone for pain control. Simethicone for gas pain. Care management consult for HH tube feeds.   Await calorie counts, but patient not eating much.  Would like to get drains out before he goes home.  Looks serosanguinous, but output relatively high.  Awaiting results of lipase from drains.  Is a send out lab to lab [place] long lab going to see how much longer it will take to get result.  If lipase elevated, will plan to send home with drains.  Plan staple removal prior to d/c.     LOS: 8 days      [facility], [place] [phone]   08/29/2016 10:06 AM

## 2016-08-29 NOTE — Progress Notes (Signed)
Pt vomited x2 and was in pain in the left portion of his abdomen. Paged on call doctor since Dr. Barry Dienes was off for the afternoon. Dr. Harlow Asa called back and was told to stop tube feeding for six hours. Will report off to night shift to start tube feedings again at 2230.

## 2016-08-29 NOTE — Progress Notes (Signed)
Nutrition Follow-up  DOCUMENTATION CODES:   Severe malnutrition in context of chronic illness  INTERVENTION:  Monitor magnesium, potassium, and phosphorus daily for at least 3 days, MD to replete as needed, as pt is at risk for refeeding syndrome given severe malnutrition, poor PO intake x >5 days, and low Mg/Phos levels.  Will order Osmolite 1.5 @ 20 mL/hr to advance by 10 mL every 12 hours to reach goal of Osmolite 1.5 @ 65 mL/hr which will provide 2340 kcal, 98 grams of protein (89% estimated protein need), and 1189 mL free water.   - Will d/c Prostat per pt request. - RD will follow-up 1/21.  NUTRITION DIAGNOSIS:   Malnutrition related to chronic illness as evidenced by percent weight loss, energy intake < or equal to 75% for > or equal to 1 month, severe depletion of body fat, severe depletion of muscle mass. - ongoing  GOAL:   Patient will meet greater than or equal to 90% of their needs -unmet  MONITOR:   TF tolerance, Weight trends, Labs, Skin, I & O's  ASSESSMENT:   61 year old male who presents for a follow-up for Pancreatic cancer. Patient was diagnosed with pancreatic cancer in August 2017. Presenting symptoms were jaundice, pain, and weight loss. He has been getting neoadjuvant treatment and is here to discuss possible surgery. Follow up imaging was favorable. He has had a difficult time with chemotherapy. He has had significant fatigue and weakness, but is still able to get around the house.   1/19 Spoke with RN who confirms pt had a BM last night and that Glucerna 1.2 was increased from 10 mL/hr to 20 mL/hr today. Pt had a full bowl of cereal for breakfast and sometime later vomited. Will change TF via J-tube to regimen outlined above. Pt refusing Prostat. Will discuss other oral nutrition supplement options at follow-up once pt is tolerating TF advancement. Continue to encourage PO intakes as tolerated. No new weight since 08/24/16.  Medications reviewed; 10 mg  Dulcolax/day, sliding scale Novolog, 15 units Lantus/day, 4 g IV Mg sulfate x1 dose yesterday, daily multivitamin with minerals, PRN Zofran, 40 mg oral Protonix/day, 40 mEq oral KCl/day.  Labs reviewed; CBGs: 83-122 mg/dL today, Cl: 99 mmol/L, Ca: 8.5 mg/dL.  IVF: D5-1/2 NS-20 mEq KCl @ 50 mL/hr (204 kcal).     1/18 Patient's tube feed decreased to 10 ml/hr overnight d/t pt's c/o fullness and pain. Pt still has not had a BM. States he feels better once tube feeding was turned down. Noted in surgery note, plans for suppository if no BM today. If patient is unable to tolerate Glucerna formulation after BM, RD will adjust formula to Osmolite which is fiber-free and lower in fat. Reviewed plan with RN. Per patient, he ate 100% of a spaghetti dinner yesterday for lunch. He did not want anything for dinner d/t discomfort. Per RN he has not eaten any breakfast this morning. Calorie Count details in separate note. Addendum: Calorie count order discontinued.   Diet Order:  DIET SOFT Room service appropriate? Yes; Fluid consistency: Thin  Skin:  Wound (see comment) (1/11 abdominal incision)  Last BM:  1/18  Height:   Ht Readings from Last 1 Encounters:  08/21/16 5\' 11"  (1.803 m)    Weight:   Wt Readings from Last 1 Encounters:  08/24/16 170 lb 6.7 oz (77.3 kg)    Ideal Body Weight:  78.2 kg  BMI:  Body mass index is 23.77 kg/m.  Estimated Nutritional Needs:  Kcal:  2200-2400  Protein:  110-120g  Fluid:  2.2-2.4L/day  EDUCATION NEEDS:   Education needs addressed    Jarome Matin, MS, RD, LDN, CNSC Inpatient Clinical Dietitian Pager # [phone] After hours/weekend pager # [phone]

## 2016-08-30 LAB — GLUCOSE, CAPILLARY
GLUCOSE-CAPILLARY: 95 mg/dL (ref 65–99)
GLUCOSE-CAPILLARY: 122 mg/dL — AB (ref 65–99)
GLUCOSE-CAPILLARY: 144 mg/dL — AB (ref 65–99)
Glucose-Capillary: 186 mg/dL — ABNORMAL HIGH (ref 65–99)
Glucose-Capillary: 77 mg/dL (ref 65–99)
Glucose-Capillary: 81 mg/dL (ref 65–99)

## 2016-08-30 LAB — BASIC METABOLIC PANEL
ANION GAP: 6 (ref 5–15)
BUN: 7 mg/dL (ref 6–20)
CALCIUM: 8.6 mg/dL — AB (ref 8.9–10.3)
CO2: 29 mmol/L (ref 22–32)
Chloride: 99 mmol/L — ABNORMAL LOW (ref 101–111)
Creatinine, Ser: 0.75 mg/dL (ref 0.61–1.24)
Glucose, Bld: 112 mg/dL — ABNORMAL HIGH (ref 65–99)
Potassium: 4.1 mmol/L (ref 3.5–5.1)
Sodium: 134 mmol/L — ABNORMAL LOW (ref 135–145)

## 2016-08-30 LAB — TRIGLYCERIDES: TRIGLYCERIDES: 89 mg/dL (ref <150)

## 2016-08-30 LAB — PHOSPHORUS: PHOSPHORUS: 3.9 mg/dL (ref 2.5–4.6)

## 2016-08-30 LAB — MAGNESIUM: Magnesium: 1.8 mg/dL (ref 1.7–2.4)

## 2016-08-30 MED ORDER — CLINIMIX E/DEXTROSE (5/20) 5 % IV SOLN
INTRAVENOUS | Status: AC
Start: 2016-08-30 — End: 2016-08-31
  Administered 2016-08-30: 17:00:00 via INTRAVENOUS
  Filled 2016-08-30: qty 960

## 2016-08-30 MED ORDER — POTASSIUM CHLORIDE IN NACL 20-0.9 MEQ/L-% IV SOLN
INTRAVENOUS | Status: DC
  Administered 2016-08-30: 17:00:00 via INTRAVENOUS
  Filled 2016-08-30 (×2): qty 1000

## 2016-08-30 MED ORDER — SENNOSIDES-DOCUSATE SODIUM 8.6-50 MG PO TABS
1.0000 | ORAL_TABLET | Freq: Two times a day (BID) | ORAL | Status: DC
Start: 2016-08-30 — End: 2016-09-04
  Administered 2016-08-30 – 2016-09-04 (×10): 1 via ORAL
  Filled 2016-08-30 (×12): qty 1

## 2016-08-30 MED ORDER — FAT EMULSION 20 % IV EMUL
120.0000 mL | INTRAVENOUS | Status: AC
  Administered 2016-08-30: 120 mL via INTRAVENOUS
  Filled 2016-08-30: qty 250

## 2016-08-30 MED ORDER — SODIUM CHLORIDE 0.9% FLUSH
10.0000 mL | INTRAVENOUS | Status: DC | PRN
  Administered 2016-09-03 – 2016-09-04 (×4): 10 mL
  Filled 2016-08-30 (×4): qty 40

## 2016-08-30 MED ORDER — POLYETHYLENE GLYCOL 3350 17 G PO PACK
17.0000 g | PACK | Freq: Every day | ORAL | Status: DC
  Administered 2016-08-30 – 2016-09-04 (×5): 17 g via ORAL
  Filled 2016-08-30 (×5): qty 1

## 2016-08-30 MED ORDER — PROMETHAZINE HCL 25 MG/ML IJ SOLN
12.5000 mg | Freq: Four times a day (QID) | INTRAMUSCULAR | Status: DC | PRN
  Administered 2016-08-30 – 2016-09-01 (×5): 25 mg via INTRAVENOUS
  Administered 2016-09-01: 12.5 mg via INTRAVENOUS
  Filled 2016-08-30 (×6): qty 1

## 2016-08-30 NOTE — Progress Notes (Signed)
Pt had order to restart tube feedings at 2230. Pt refused. Pt stated he feels too full and too much pressure to try the tube feeding at this time. Will offer again at later time Rosie Fate

## 2016-08-30 NOTE — Progress Notes (Signed)
[facility] NOTE   Pharmacy Consult for TPN Indication: intolerance to TF  Patient Measurements: Height: 5\' 11"  (180.3 cm) Weight: 170 lb 6.7 oz (77.3 kg) IBW/kg (Calculated) : 75.3 TPN AdjBW (KG): 73.9 Body mass index is 23.77 kg/m. Usual Weight:    Insulin Requirements: 4 units SSI/24h - on lantus 15 units daily. SSI q24h  Current Nutrition: soft diet, TF - held due to intolerance  IVF: D545 + 20 meq KCl at 50 ml/hr  Central access: PICC 1/20 - IVT expects to be able to place today TPN start date: 1/20  ASSESSMENT                                                                                                      HPI:  60 YOM presented 1/11 for pancreaticoduodenectomy d/t pancreatic cancer.  He was been placed on TF post-operatively as well as on soft diet.  He complains of abdominal pain and vomiting with resuming TF.  Orders to place PICC and start TPN 1/20.   Significant events:   Today:    Glucose - history of DM on lantus prior to admission.   Electrolytes - Na slightly low, Corrected Ca WNL  Renal - SCr WNL  LFTs - ordered for am, previous labs w/o issue  TGs - pending  Prealbumin - ordered for am  NUTRITIONAL GOALS                                                                                             RD recs: 2200-2400 Kcals, 110-120gm protein  Clinimix 5/20 at a goal rate of 52ml/hr + 20% fat emulsion at 34ml/hr over 12h provide: 96g/day protein, 2169 Kcal/day .  PLAN                                                                                                                         At 1800 today:  Start Clinimix E5/20 at 40 ml/hr.  20% fat emulsion at 9ml/hr over 12h (increase to 20 ml/hr as tolerates) .  Start at ~50% needs due refeeding risk.  Will need to watch CBGs, and electrolytes closely  Plan to advance as  tolerated to the goal rate.  Due to national backorder of trace elements, the TPN  will NOT contain standard multivitamins and trace elements as patient is on multivitamin with minerals tab and has been able to take.  If he becomes unable to take this orally then will give MVI and trace every other day via TPN.   Change IVF to NS + 20 meq KCl  infuse at [place] current insulin regimen  TPN lab panels on Mondays & Thursdays.  F/u daily.  Doreene Eland, PharmD, BCPS.   Pager: [phone] 08/30/2016 8:38 AM

## 2016-08-30 NOTE — Progress Notes (Signed)
Post-Op [facility] Subjective: Had recurrent pain last night when tube feeds advanced.  Threw up 3 times.    Objective: Vital signs in last 24 hours: Temp:  [98.4 F (36.9 C)-98.8 F (37.1 C)] 98.4 F (36.9 C) (01/20 0429) Pulse Rate:  [96-106] 106 (01/20 0429) Resp:  [16-18] 18 (01/20 0429) BP: (105-149)/(75-96) 130/87 (01/20 0429) SpO2:  [100 %] 100 % (01/20 0429)   Intake/Output from previous day: 01/19 0701 - 01/20 0700 In: 1230 [I.V.:1200] Out: 800 [Urine:550; Drains:250] Intake/Output this shift: No intake/output data recorded.   General appearance: alert and cooperative GI: soft, appropriately tender, JP: SS fluid Breathing comfortably Incision: c/d/i.   Lab Results:   Recent Labs  08/28/16 0447  WBC 9.1  HGB 9.6*  HCT 29.6*  PLT 313   BMET  Recent Labs  08/28/16 0447  NA 138  K 4.1  CL 99*  CO2 31  GLUCOSE 145*  BUN 7  CREATININE 0.76  CALCIUM 8.5*   PT/INR No results for input(s): LABPROT, INR in the last 72 hours. ABG No results for input(s): PHART, HCO3 in the last 72 hours.  Invalid input(s): PCO2, PO2  MEDS, Scheduled . enoxaparin (LOVENOX) injection  40 mg Subcutaneous Q24H  . insulin aspart  0-15 Units Subcutaneous Q4H  . insulin glargine  15 Units Subcutaneous Daily  . multivitamin with minerals  1 tablet Oral Daily  . pantoprazole  40 mg Oral Daily  . polyethylene glycol  17 g Oral Daily  . potassium chloride  40 mEq Oral Daily  . senna-docusate  1 tablet Oral BID  . simethicone  80 mg Oral TID    Studies/Results: No results found.  Assessment: s/p Procedure(s): LAPAROSCOPY DIAGNOSTIC WHIPPLE PROCEDURE Patient Active Problem List   Diagnosis Date Noted  . Adenocarcinoma of head of pancreas ([place]) 08/21/2016  . Port catheter in place 04/08/2016  . 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 Epigastric pain 03/12/2016  . AKI (acute kidney injury) ([place])  03/12/2016  . Diabetes mellitus with complication ([place]) 123XX123  . ETOH abuse 03/12/2016  . Sinus tachycardia 03/12/2016     Plan: Soft diet as tolerated.  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  DM - Fingersticks OK with lantus, SSI.    Severe protein calorie malnutrition.  Hold on tube feeds for intolerance.  Start TNA.  Recheck electrolytes in AM including mag and prealbumin.      Oxycodone for pain control. Simethicone for gas pain. Add aggressive bowel regimen.    Would like to get drains out before he goes home.  Looks serosanguinous, but output relatively high.  Awaiting results of lipase from drains.  Is a send out lab to lab [place] long lab going to see how much longer it will take to get result.  If lipase elevated, will plan to send home with drains.  Plan staple removal prior to d/c.     LOS: 9 days      [facility], [place] [phone]   08/30/2016 8:29 AM

## 2016-08-30 NOTE — Progress Notes (Signed)
Peripherally Inserted Central Catheter/Midline Placement  The IV Nurse has discussed with the patient and/or persons authorized to consent for the patient, the purpose of this procedure and the potential benefits and risks involved with this procedure.  The benefits include less needle sticks, lab draws from the catheter, and the patient may be discharged home with the catheter. Risks include, but not limited to, infection, bleeding, blood clot (thrombus formation), and puncture of an artery; nerve damage and irregular heartbeat and possibility to perform a PICC exchange if needed/ordered by physician.  Alternatives to this procedure were also discussed.  Bard Power PICC patient education guide, fact sheet on infection prevention and patient information card has been provided to patient /or left at bedside.    PICC/Midline Placement Documentation  PICC Double Lumen 08/30/16 PICC Right Brachial 40 cm 1 cm (Active)  Indication for Insertion or Continuance of Line Administration of hyperosmolar/irritating solutions (i.e. TPN, Vancomycin, etc.) 08/30/2016  4:35 PM  Exposed Catheter (cm) 1 cm 08/30/2016  4:35 PM  Site Assessment Clean;Dry;Intact 08/30/2016  4:35 PM  Lumen #1 Status Flushed;Saline locked;Blood return noted 08/30/2016  4:35 PM  Lumen #2 Status Flushed;Saline locked;Blood return noted 08/30/2016  4:35 PM  Dressing Type Transparent 08/30/2016  4:35 PM  Dressing Status Clean;Dry;Intact;Antimicrobial disc in place 08/30/2016  4:35 PM  Line Care Connections checked and tightened 08/30/2016  4:35 PM  Line Adjustment (NICU/IV Team Only) No 08/30/2016  4:35 PM  Dressing Intervention New dressing 08/30/2016  4:35 PM  Dressing Change Due 09/06/16 08/30/2016  4:35 PM       Rolena Infante 08/30/2016, 4:36 PM

## 2016-08-30 NOTE — Progress Notes (Signed)
NUTRITION NOTE  Pt seen for full follow-up by this RD on 1/19 with associated note at 1337. Consult received this AM for new TPN. RD working tomorrow to provide full follow-up and recommendations at that time.    Estimated Nutritional Needs:  Kcal:  2200-2400 Protein:  110-120g Fluid:  2.2-2.4L/day    Jarome Matin, MS, RD, LDN, [facility] Inpatient Clinical Dietitian Pager # [phone] After hours/weekend pager # [phone]

## 2016-08-31 ENCOUNTER — Encounter (HOSPITAL_COMMUNITY): Payer: Self-pay | Admitting: Radiology

## 2016-08-31 ENCOUNTER — Inpatient Hospital Stay (HOSPITAL_COMMUNITY): Payer: BLUE CROSS/BLUE SHIELD

## 2016-08-31 LAB — COMPREHENSIVE METABOLIC PANEL
ALBUMIN: 2.6 g/dL — AB (ref 3.5–5.0)
ALT: 18 U/L (ref 17–63)
AST: 14 U/L — AB (ref 15–41)
Alkaline Phosphatase: 69 U/L (ref 38–126)
Anion gap: 7 (ref 5–15)
BUN: 8 mg/dL (ref 6–20)
CO2: 28 mmol/L (ref 22–32)
CREATININE: 0.76 mg/dL (ref 0.61–1.24)
Calcium: 8.3 mg/dL — ABNORMAL LOW (ref 8.9–10.3)
Chloride: 98 mmol/L — ABNORMAL LOW (ref 101–111)
GFR calc Af Amer: >60 mL/min (ref >60)
GLUCOSE: 138 mg/dL — AB (ref 65–99)
Potassium: 4 mmol/L (ref 3.5–5.1)
Sodium: 133 mmol/L — ABNORMAL LOW (ref 135–145)
Total Bilirubin: 0.4 mg/dL (ref 0.3–1.2)
Total Protein: 5.6 g/dL — ABNORMAL LOW (ref 6.5–8.1)

## 2016-08-31 LAB — GLUCOSE, CAPILLARY
GLUCOSE-CAPILLARY: 132 mg/dL — AB (ref 65–99)
GLUCOSE-CAPILLARY: 142 mg/dL — AB (ref 65–99)
GLUCOSE-CAPILLARY: 167 mg/dL — AB (ref 65–99)
GLUCOSE-CAPILLARY: 167 mg/dL — AB (ref 65–99)
Glucose-Capillary: 156 mg/dL — ABNORMAL HIGH (ref 65–99)
Glucose-Capillary: 156 mg/dL — ABNORMAL HIGH (ref 65–99)

## 2016-08-31 LAB — CBC
HEMATOCRIT: 29.5 % — AB (ref 39.0–52.0)
Hemoglobin: 9.6 g/dL — ABNORMAL LOW (ref 13.0–17.0)
MCH: 25.4 pg — ABNORMAL LOW (ref 26.0–34.0)
MCHC: 32.5 g/dL (ref 30.0–36.0)
MCV: 78 fL (ref 78.0–100.0)
PLATELETS: 392 10*3/uL (ref 150–400)
RBC: 3.78 MIL/uL — AB (ref 4.22–5.81)
RDW: 14.9 % (ref 11.5–15.5)
WBC: 9.1 10*3/uL (ref 4.0–10.5)

## 2016-08-31 LAB — MAGNESIUM: Magnesium: 2 mg/dL (ref 1.7–2.4)

## 2016-08-31 LAB — PHOSPHORUS: Phosphorus: 4 mg/dL (ref 2.5–4.6)

## 2016-08-31 LAB — PREALBUMIN: Prealbumin: 8.7 mg/dL — ABNORMAL LOW (ref 18–38)

## 2016-08-31 LAB — DIFFERENTIAL
Basophils Absolute: 0 10*3/uL (ref 0.0–0.1)
Basophils Relative: 0 %
Eosinophils Absolute: 0.5 10*3/uL (ref 0.0–0.7)
Eosinophils Relative: 5 %
LYMPHS ABS: 1.2 10*3/uL (ref 0.7–4.0)
LYMPHS PCT: 14 %
MONOS PCT: 12 %
Monocytes Absolute: 1.1 10*3/uL — ABNORMAL HIGH (ref 0.1–1.0)
NEUTROS ABS: 6.3 10*3/uL (ref 1.7–7.7)
NEUTROS PCT: 69 %

## 2016-08-31 LAB — LIPASE, FLUID
LIPASE-FLUID: 4 U/L
Lipase-Fluid: 4 U/L

## 2016-08-31 MED ORDER — IOPAMIDOL (ISOVUE-300) INJECTION 61%
15.0000 mL | Freq: Once | INTRAVENOUS | Status: AC | PRN
  Administered 2016-08-31: 15 mL via ORAL

## 2016-08-31 MED ORDER — FAT EMULSION 20 % IV EMUL
240.0000 mL | INTRAVENOUS | Status: AC
  Administered 2016-08-31: 240 mL via INTRAVENOUS
  Filled 2016-08-31: qty 250

## 2016-08-31 MED ORDER — IOPAMIDOL (ISOVUE-300) INJECTION 61%
INTRAVENOUS | Status: AC
  Administered 2016-08-31: 100 mL via INTRAVENOUS
  Filled 2016-08-31: qty 100

## 2016-08-31 MED ORDER — INSULIN REGULAR HUMAN 100 UNIT/ML IJ SOLN
INTRAVENOUS | Status: AC
  Administered 2016-08-31: 18:00:00 via INTRAVENOUS
  Filled 2016-08-31: qty 1440

## 2016-08-31 MED ORDER — INSULIN GLARGINE 100 UNIT/ML ~~LOC~~ SOLN
18.0000 [IU] | Freq: Every day | SUBCUTANEOUS | Status: DC
  Administered 2016-08-31: 18 [IU] via SUBCUTANEOUS
  Filled 2016-08-31 (×2): qty 0.18

## 2016-08-31 NOTE — Progress Notes (Signed)
Patient vomited 150 cc brown  fecal smelling emesis

## 2016-08-31 NOTE — Progress Notes (Signed)
Nutrition Follow-up  DOCUMENTATION CODES:   Severe malnutrition in context of chronic illness  INTERVENTION:  Monitor magnesium, potassium, and phosphorus daily for at least 3 days, MD to replete as needed, as pt is at risk for refeeding syndrome given severe malnutrition, poor PO intake x >5 days, and low Mg/Phos levels.  TPN per Pharmacy   RD will continue to monitor for plan.  NUTRITION DIAGNOSIS:   Malnutrition related to chronic illness as evidenced by percent weight loss, energy intake < or equal to 75% for > or equal to 1 month, severe depletion of body fat, severe depletion of muscle mass.  Ongoing.  GOAL:   Patient will meet greater than or equal to 90% of their needs  Progressing.  MONITOR:   Labs, Weight trends, Skin, I & O's, Other (Comment) (TPN)  REASON FOR ASSESSMENT:   Consult New TPN/TNA  ASSESSMENT:   61 year old male who presents for a follow-up for Pancreatic cancer. Patient was diagnosed with pancreatic cancer in August 2017. Presenting symptoms were jaundice, pain, and weight loss. He has been getting neoadjuvant treatment and is here to discuss possible surgery. Follow up imaging was favorable. He has had a difficult time with chemotherapy. He has had significant fatigue and weakness, but is still able to get around the house.   Patient's TF is held d/t nausea and vomiting over the weekend. TPN is initiated per surgery. Pt has had a BM.   Medications: Multivitamin with minerals daily, Miralax packet daily, K-DUR tablet daily, Senokot-S tablet BID, IV Zofran PRN, IV Phenergan PRN Labs reviewed: CBGs: 167 Low Na Mg/Phos WNL  Plan per Pharmacy 1/21: At 1800 today  Advance Clinimix E5/20 to 60 ml/hr as no signs of refeeding  20% fat emulsion at 9ml/hr over 12h  Plan to advance as tolerated to the goal rate.  Due to national backorder of trace elements, the TPN will NOT contain standard multivitamins and trace elements as patient is on  multivitamin with minerals tab and has been able to take.  If he becomes unable to take this orally then will give MVI and trace every other day via TPN.   Diet Order:  DIET SOFT Room service appropriate? Yes; Fluid consistency: Thin TPN (CLINIMIX-E) Adult TPN (CLINIMIX-E) Adult  Skin:  Wound (see comment) (1/11 abdominal incision)  Last BM:  1/18  Height:   Ht Readings from Last 1 Encounters:  08/21/16 5\' 11"  (1.803 m)    Weight:   Wt Readings from Last 1 Encounters:  08/24/16 170 lb 6.7 oz (77.3 kg)    Ideal Body Weight:  78.2 kg  BMI:  Body mass index is 23.77 kg/m.  Estimated Nutritional Needs:   Kcal:  2200-2400  Protein:  110-120g  Fluid:  2.2-2.4L/day  EDUCATION NEEDS:   Education needs addressed  Clayton Bibles, MS, RD, LDN Pager: [phone] After Hours Pager: [phone]

## 2016-08-31 NOTE — Progress Notes (Signed)
Post-Op Whipple Subjective: Still having significant nausea.  Pain sl improved.  New hiccups today.    Objective: Vital signs in last 24 hours: Temp:  [98.6 F (37 C)-98.9 F (37.2 C)] 98.9 F (37.2 C) (01/21 0552) Pulse Rate:  [96-100] 100 (01/21 0552) Resp:  [16-18] 18 (01/21 0552) BP: (132-167)/(83-93) 144/83 (01/21 0552) SpO2:  [99 %-100 %] 100 % (01/21 0552)   Intake/Output from previous day: 01/20 0701 - 01/21 0700 In: 1007 [I.V.:1007] Out: 1510 [Urine:1300; Drains:210] Intake/Output this shift: No intake/output data recorded.   General appearance: alert and cooperative GI: soft, appropriately tender, JP: SS fluid Breathing comfortably Incision: c/d/i.   Lab Results:   Recent Labs  08/31/16 0555  WBC 9.1  HGB 9.6*  HCT 29.5*  PLT 392   BMET  Recent Labs  08/30/16 0900 08/31/16 0555  NA 134* 133*  K 4.1 4.0  CL 99* 98*  CO2 29 28  GLUCOSE 112* 138*  BUN 7 8  CREATININE 0.75 0.76  CALCIUM 8.6* 8.3*   PT/INR No results for input(s): LABPROT, INR in the last 72 hours. ABG No results for input(s): PHART, HCO3 in the last 72 hours.  Invalid input(s): PCO2, PO2  MEDS, Scheduled . enoxaparin (LOVENOX) injection  40 mg Subcutaneous Q24H  . insulin aspart  0-15 Units Subcutaneous Q4H  . insulin glargine  18 Units Subcutaneous Daily  . multivitamin with minerals  1 tablet Oral Daily  . pantoprazole  40 mg Oral Daily  . polyethylene glycol  17 g Oral Daily  . potassium chloride  40 mEq Oral Daily  . senna-docusate  1 tablet Oral BID  . simethicone  80 mg Oral TID    Studies/Results: No results found.  Assessment: s/p Procedure(s): LAPAROSCOPY DIAGNOSTIC WHIPPLE PROCEDURE Patient Active Problem List   Diagnosis Date Noted  . Adenocarcinoma of head of pancreas ([place]) 08/21/2016  . Port catheter in place 04/08/2016  . 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 Epigastric pain  03/12/2016  . AKI (acute kidney injury) ([place]) 03/12/2016  . Diabetes mellitus with complication ([place]) 123XX123  . ETOH abuse 03/12/2016  . Sinus tachycardia 03/12/2016     Plan: S/p whipple  Given clinical worsening, will get CT scan.  Unlikely to have abscess given lack of physiologic signs or leukocytosis, but could have sterile fluid collection with mechanical issues.    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  DM - Fingersticks OK with lantus, SSI.  Increase lantus today.  Severe protein calorie malnutrition.  Hold on tube feeds for intolerance.  TNA.Prealbumin is pending.  Oxycodone for pain control. Simethicone for gas pain. Aggressive bowel regimen.    Would like to get drains out before he goes home.  Looks serosanguinous, but output relatively high.  Awaiting results of lipase from drains.  Is a send out lab to lab [place] [facility] lab going to see how much longer it will take to get result.  If lipase elevated, will plan to send home with drains.  Plan staple removal prior to d/c.     LOS: 10 days      [facility], [place] [phone]   08/31/2016 8:57 AM

## 2016-08-31 NOTE — Progress Notes (Signed)
Patient vomited  approximately 50 ml of foul smelling brown emesis.

## 2016-08-31 NOTE — Progress Notes (Signed)
[place] NOTE   Pharmacy Consult for TPN Indication: intolerance to TF  Patient Measurements: Height: 5\' 11"  (180.3 cm) Weight: 170 lb 6.7 oz (77.3 kg) IBW/kg (Calculated) : 75.3 TPN AdjBW (KG): 73.9 Body mass index is 23.77 kg/m. Usual Weight:    Insulin Requirements: 11 units SSI since start of TPN last evening  - on lantus 15 units daily. SSI q24h  Current Nutrition: soft diet, TF - held due to intolerance  IVF: NS + 20 meq KCl at 10 ml/hr  Central access: PICC 1/20  TPN start date: 1/20  ASSESSMENT                                                                                                      HPI:  12 YOM presented 1/11 for pancreaticoduodenectomy d/t pancreatic cancer.  He was been placed on TF post-operatively as well as on soft diet.  He complains of abdominal pain and vomiting with resuming TF.  Orders to place PICC and start TPN 1/20.   Significant events:   Today:    Glucose - history of DM on lantus prior to admission.  CBGs above goal of < 150 mg/dl with start of TPN  1/21: Surgery increased lantus from 15 units to 18 units daily  Electrolytes - Na slightly low (low prior to start of TPN), Corrected Ca WNL  Renal - SCr WNL  LFTs - WNL (AST low)  TGs - 89 (1/20)  Prealbumin - pending  NUTRITIONAL GOALS                                                                                             RD recs: 2200-2400 Kcals, 110-120gm protein  Clinimix 5/20 at a goal rate of 58ml/hr + 20% fat emulsion at 45ml/hr over 12h provide: 96g/day protein, 2169 Kcal/day .  PLAN                                                                                                                         At 1800 today  Advance Clinimix E5/20 to 60 ml/hr as no signs of refeeding  20% fat emulsion at 73ml/hr over 12h  Plan to advance as tolerated to the goal rate.  Due to national backorder of trace elements, the TPN will NOT  contain standard multivitamins and trace elements as patient is on multivitamin with minerals tab and has been able to take.  If he becomes unable to take this orally then will give MVI and trace every other day via TPN.   continue IVF to NS + 20 meq KCl  infuse at [place] current insulin regimen but add regular insulin 15 units for increase rate (inc Dex).  Noted small change to lantus dose this am  TPN lab panels on Mondays & Thursdays.  F/u daily.  Doreene Eland, PharmD, BCPS.   Pager: [phone] 08/31/2016 8:48 AM

## 2016-09-01 LAB — COMPREHENSIVE METABOLIC PANEL
ALK PHOS: 71 U/L (ref 38–126)
ALT: 17 U/L (ref 17–63)
ANION GAP: 7 (ref 5–15)
AST: 12 U/L — ABNORMAL LOW (ref 15–41)
Albumin: 2.6 g/dL — ABNORMAL LOW (ref 3.5–5.0)
BUN: 9 mg/dL (ref 6–20)
CALCIUM: 8.5 mg/dL — AB (ref 8.9–10.3)
CO2: 30 mmol/L (ref 22–32)
Chloride: 97 mmol/L — ABNORMAL LOW (ref 101–111)
Creatinine, Ser: 0.65 mg/dL (ref 0.61–1.24)
GFR calc non Af Amer: >60 mL/min (ref >60)
Glucose, Bld: 177 mg/dL — ABNORMAL HIGH (ref 65–99)
Potassium: 4 mmol/L (ref 3.5–5.1)
SODIUM: 134 mmol/L — AB (ref 135–145)
Total Bilirubin: 0.2 mg/dL — ABNORMAL LOW (ref 0.3–1.2)
Total Protein: 6 g/dL — ABNORMAL LOW (ref 6.5–8.1)

## 2016-09-01 LAB — DIFFERENTIAL
BASOS ABS: 0 10*3/uL (ref 0.0–0.1)
BASOS PCT: 0 %
Eosinophils Absolute: 0.5 10*3/uL (ref 0.0–0.7)
Eosinophils Relative: 5 %
Lymphocytes Relative: 11 %
Lymphs Abs: 1.1 10*3/uL (ref 0.7–4.0)
MONOS PCT: 10 %
Monocytes Absolute: 1 10*3/uL (ref 0.1–1.0)
NEUTROS ABS: 7.4 10*3/uL (ref 1.7–7.7)
NEUTROS PCT: 74 %

## 2016-09-01 LAB — CBC
HEMATOCRIT: 30.3 % — AB (ref 39.0–52.0)
Hemoglobin: 9.6 g/dL — ABNORMAL LOW (ref 13.0–17.0)
MCH: 24.2 pg — ABNORMAL LOW (ref 26.0–34.0)
MCHC: 31.7 g/dL (ref 30.0–36.0)
MCV: 76.5 fL — ABNORMAL LOW (ref 78.0–100.0)
PLATELETS: 380 10*3/uL (ref 150–400)
RBC: 3.96 MIL/uL — ABNORMAL LOW (ref 4.22–5.81)
RDW: 14.6 % (ref 11.5–15.5)
WBC: 9.9 10*3/uL (ref 4.0–10.5)

## 2016-09-01 LAB — GLUCOSE, CAPILLARY
GLUCOSE-CAPILLARY: 177 mg/dL — AB (ref 65–99)
Glucose-Capillary: 147 mg/dL — ABNORMAL HIGH (ref 65–99)
Glucose-Capillary: 157 mg/dL — ABNORMAL HIGH (ref 65–99)
Glucose-Capillary: 198 mg/dL — ABNORMAL HIGH (ref 65–99)
Glucose-Capillary: 65 mg/dL (ref 65–99)
Glucose-Capillary: 87 mg/dL (ref 65–99)

## 2016-09-01 LAB — MAGNESIUM: Magnesium: 1.7 mg/dL (ref 1.7–2.4)

## 2016-09-01 LAB — PHOSPHORUS: PHOSPHORUS: 4.3 mg/dL (ref 2.5–4.6)

## 2016-09-01 MED ORDER — KCL IN DEXTROSE-NACL 20-5-0.45 MEQ/L-%-% IV SOLN
INTRAVENOUS | Status: DC
  Filled 2016-09-01: qty 1000

## 2016-09-01 MED ORDER — INSULIN GLARGINE 100 UNIT/ML ~~LOC~~ SOLN
15.0000 [IU] | Freq: Every day | SUBCUTANEOUS | Status: DC
  Administered 2016-09-02 – 2016-09-04 (×3): 15 [IU] via SUBCUTANEOUS
  Filled 2016-09-01 (×3): qty 0.15

## 2016-09-01 MED ORDER — DEXTROSE 50 % IV SOLN
INTRAVENOUS | Status: AC
  Administered 2016-09-01: 17:00:00
  Filled 2016-09-01: qty 50

## 2016-09-01 MED ORDER — INSULIN REGULAR HUMAN 100 UNIT/ML IJ SOLN
INTRAVENOUS | Status: AC
  Administered 2016-09-01: 18:00:00 via INTRAVENOUS
  Filled 2016-09-01: qty 1920

## 2016-09-01 MED ORDER — DEXTROSE 50 % IV SOLN
INTRAVENOUS | Status: AC
Start: 2016-09-01 — End: 2016-09-01
  Administered 2016-09-01: 17:00:00
  Filled 2016-09-01: qty 50

## 2016-09-01 MED ORDER — FAT EMULSION 20 % IV EMUL
240.0000 mL | INTRAVENOUS | Status: AC
  Administered 2016-09-01: 240 mL via INTRAVENOUS
  Filled 2016-09-01: qty 250

## 2016-09-01 NOTE — Progress Notes (Signed)
Post-Op Whipple Subjective: CT yesterday showed distended stomach.    Objective: Vital signs in last 24 hours: Temp:  [98.5 F (36.9 C)-98.9 F (37.2 C)] 98.5 F (36.9 C) (01/22 0537) Pulse Rate:  [97-112] 97 (01/22 0537) Resp:  [16-18] 18 (01/22 0537) BP: (139-148)/(84-98) 139/84 (01/22 0537) SpO2:  [100 %] 100 % (01/22 0537)   Intake/Output from previous day: 01/21 0701 - 01/22 0700 In: 1522.3 [P.O.:120; I.V.:1282.3] Out: [id number]; Emesis/NG output:684; Drains:189] Intake/Output this shift: No intake/output data recorded.   General appearance: alert and cooperative GI: soft, nontender, JP: SS fluid Breathing comfortably Incision: c/d/i.   Lab Results:   Recent Labs  08/31/16 0555 09/01/16 0435  WBC 9.1 9.9  HGB 9.6* 9.6*  HCT 29.5* 30.3*  PLT 392 380   BMET  Recent Labs  08/31/16 0555 09/01/16 0435  NA 133* 134*  K 4.0 4.0  CL 98* 97*  CO2 28 30  GLUCOSE 138* 177*  BUN 8 9  CREATININE 0.76 0.65  CALCIUM 8.3* 8.5*   PT/INR No results for input(s): LABPROT, INR in the last 72 hours. ABG No results for input(s): PHART, HCO3 in the last 72 hours.  Invalid input(s): PCO2, PO2  MEDS, Scheduled . enoxaparin (LOVENOX) injection  40 mg Subcutaneous Q24H  . insulin aspart  0-15 Units Subcutaneous Q4H  . insulin glargine  18 Units Subcutaneous Daily  . multivitamin with minerals  1 tablet Oral Daily  . pantoprazole  40 mg Oral Daily  . polyethylene glycol  17 g Oral Daily  . potassium chloride  40 mEq Oral Daily  . senna-docusate  1 tablet Oral BID  . simethicone  80 mg Oral TID    Studies/Results: Ct Abdomen Pelvis W Contrast  Result Date: 08/31/2016 CLINICAL DATA:  Pain and nausea after Whipple procedure.  Hiccups. EXAM: CT ABDOMEN AND PELVIS WITH CONTRAST TECHNIQUE: Multidetector CT imaging of the abdomen and pelvis was performed using the standard protocol following bolus administration of intravenous contrast. CONTRAST:  71mL  ISOVUE-300 IOPAMIDOL (ISOVUE-300) INJECTION 61%, 1 ISOVUE-300 IOPAMIDOL (ISOVUE-300) INJECTION 61% COMPARISON:  06/05/2016 preoperative CT FINDINGS: Lower chest: Right basilar atelectasis. No effusion or pneumothorax. The visualized cardiac chambers are normal in size with coronary arteriosclerosis. No pericardial effusion. Hepatobiliary: Cholecystectomy with gas likely lucencies in the operative bed more often consistent with postoperative change. Pneumobilia consistent with recent Whipple procedure. Pancreas: Resected pancreatic head consistent with Whipple procedure with pancreatic duct stent is seen within the pancreatic duct extending into the anastomosed jejunum. Spleen: Intact without resection.  No splenomegaly. Adrenals/Urinary Tract: Normal bilateral adrenal glands. 4 mm right upper pole renal cyst too small to further characterize. No obstructive uropathy or nephrolithiasis on either side. Urinary bladder is physiologically distended. Stomach/Bowel: Resected gastric antrum and portions of the duodenum as part of a Whipple procedure. Moderate-to-marked gastric distention with fluid and food. No dehiscence identified at the hepaticojejunostomy, pancreaticojejunostomy nor gastro jejunostomy. Moderate fluid-filled jejunum which may represent a postoperative ileus. Distal small bowel appears contracted. No definite transition. Vascular/Lymphatic: Aortoiliac atherosclerosis. No aneurysm. No lymphadenopathy. Reproductive: Normal size prostate. Other: Two drains are identified, the more lateral adjacent to the choledochal jejunostomy site and the more medial in tibia and superior to the pancreaticojejunostomy. Left-sided feeding tube is seen within small bowel. Pancreatic duct stent is noted extending into the anastomosed jejunum. Musculoskeletal: Nonacute IMPRESSION: 1. Status post classic appearing Whipple procedure by dehiscence of suture sites. Marked gastric distention with fluid and food may reflect delayed  gastric emptying. Fluid-filled  jejunum has the appearance of postoperative ileus. No definite transition site is identified. 2. No sterile fluid collection or abscess. There are mottled gas like lucencies in the gallbladder fossa which may represent postop change. Electronically Signed   By: Ashley Royalty M.D.   On: 08/31/2016 19:21    Assessment: s/p Procedure(s): LAPAROSCOPY DIAGNOSTIC WHIPPLE PROCEDURE Patient Active Problem List   Diagnosis Date Noted  . Adenocarcinoma of head of pancreas ([place]) 08/21/2016  . Port catheter in place 04/08/2016  . 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 Epigastric pain 03/12/2016  . AKI (acute kidney injury) ([place]) 03/12/2016  . Diabetes mellitus with complication ([place]) 123XX123  . ETOH abuse 03/12/2016  . Sinus tachycardia 03/12/2016     Plan: S/p whipple  Delayed gastric emptying.  Offered NGT, pt refused.  Will make npo other that sips/ice chips.  Plan restart tube feeds tomorrow.    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  DM - Fingersticks OK with lantus, SSI.  Increase lantus today.  Severe protein calorie malnutrition.  Hold on tube feeds for intolerance.  TNA.  Prealbumin is 8.7.  Oxycodone for pain control. Simethicone for gas pain. Aggressive bowel regimen.    Would like to get drains out before he goes home.  Looks serosanguinous, but output relatively high.  Awaiting results of lipase from drains.  Is [facility]  Lab anticipated to be ready 1/25.  If lipase elevated, will plan to send home with drains.  Plan staple removal prior to d/c.     LOS: 11 days      [facility], [place] [phone]   09/01/2016 8:36 AM

## 2016-09-01 NOTE — Progress Notes (Signed)
[place] NOTE   Pharmacy Consult for TPN Indication: intolerance to TF  Patient Measurements: Height: 5\' 11"  (180.3 cm) Weight: 170 lb 6.7 oz (77.3 kg) IBW/kg (Calculated) : 75.3 TPN AdjBW (KG): 73.9 Body mass index is 23.77 kg/m. Usual Weight:    Insulin Requirements: 16 units SSI in the past 24 hrs + 15 units regular insulin/day in TPN - Lantus increased to 18 units daily yesterday.  Current Nutrition: soft diet, TF - held due to intolerance  IVF: NS + 20 meq KCl at 10 ml/hr  Central access: PICC 1/20  TPN start date: 1/20  ASSESSMENT                                                                                                      HPI:  89 YOM presented 1/11 for pancreaticoduodenectomy d/t pancreatic cancer.  He was been placed on TF post-operatively as well as on soft diet.  He complains of abdominal pain and vomiting with resuming TF.  Orders to place PICC and start TPN 1/20.   Significant events:   Today:    Glucose - history of DM on lantus prior to admission.  Some CBGs above goal of < 150 mg/dl with advancement of TPN rate  1/21: Surgery increased lantus from 15 units to 18 units daily  Electrolytes - Na slightly low (low prior to start of TPN) but improved, Mg at low-end of goal, Corrected Ca WNL  Renal - SCr WNL/stable  LFTs - WNL (AST low)  TGs - WNL 89 (1/20)  Prealbumin - low 8.7 (1/21)  NUTRITIONAL GOALS                                                                                             RD recs: 2200-2400 Kcals, 110-120gm protein  Clinimix 5/20 at a goal rate of 21ml/hr + 20% fat emulsion at 26ml/hr over 12h provide: 96g/day protein, 2169 Kcal/day .  PLAN                                                                                                                         At 1800 today  Advance Clinimix E5/20  to 80 ml/hr (goal) as no signs of refeeding  20% fat emulsion at 83ml/hr over  12h  Due to national backorder of trace elements, the TPN will NOT contain standard multivitamins and trace elements as patient is on multivitamin with minerals tab and has been able to take.  If he becomes unable to take this orally then will give MVI and trace every other day via TPN.   continue IVF to NS + 20 meq KCl  infuse at [facility]  Increase regular insulin to 25 units based on SSI usage and for increase rate (inc Dex).  Continue Lantus 18 units SQ q24h per [facility].  TPN lab panels on Mondays & Thursdays, check BMET, Mg & Phos in AM.  F/u daily.  Peggyann Juba, PharmD, BCPS Pager: [phone] 09/01/2016 7:43 AM

## 2016-09-01 NOTE — Progress Notes (Signed)
Nutrition Follow-up  DOCUMENTATION CODES:   Severe malnutrition in context of chronic illness  INTERVENTION:   Monitor magnesium, potassium, and phosphorus daily for at least 3 days, MD to replete as needed, as pt is at risk for refeeding syndrome given severe malnutrition, poor PO intake x >5 days, and low Mg/Phos levels.  TPN per Pharmacy   Once tube feedings are able to resume: Initiate Vital AF 1.2 @ 10 ml/hr, advance by 10 ml every 24 hours to goal rate of 70 ml/hr. At goal, this will provide 2016 kcal (92% of needs), 126 g protein, and 1417 ml H2O.  RD will continue to monitor  NUTRITION DIAGNOSIS:   Malnutrition related to chronic illness as evidenced by percent weight loss, energy intake < or equal to 75% for > or equal to 1 month, severe depletion of body fat, severe depletion of muscle mass.  Ongoing.  GOAL:   Patient will meet greater than or equal to 90% of their needs  Meeting with 99% of kcal needs and 87% of protein needs with TPN at goal.  MONITOR:   Labs, Weight trends, Skin, I & O's, Other (Comment) (TPN)  ASSESSMENT:   61 year old male who presents for a follow-up for Pancreatic cancer. Patient was diagnosed with pancreatic cancer in August 2017. Presenting symptoms were jaundice, pain, and weight loss. He has been getting neoadjuvant treatment and is here to discuss possible surgery. Follow up imaging was favorable. He has had a difficult time with chemotherapy. He has had significant fatigue and weakness, but is still able to get around the house.   Per surgery note, CT scan shows patient's stomach is distended. Pt refusing NGT. Currently NPO. May resume tube feeds tomorrow. Recommendations above. RD will switch patient's TF formula to semi-elemental formula, Vital AF 1.2. Pt unable to tolerate Osmolite and Glucerna formula previously d/t N/V.  Will advance slowly.   Once TPN is at goal, patient will receive 2169 kcal (99% of needs) and 96g  protein (87% of needs).  Medications: Multivitamin with minerals daily, Protonix tablet daily, Miralax packet daily, K-DUR tablet daily, Senokot-S tablet BID, IV Zofran PRN, IV Phenergan PRN Labs reviewed: CBGs: 132-177 Low Na  Plan per Pharmacy 1/22: At 1800 today  Advance Clinimix E5/20 to 80 ml/hr (goal) as no signs of refeeding  20% fat emulsion at 74ml/hr over 12h  Due to national backorder of trace elements, the TPN will NOT contain standard multivitamins and trace elements as patient is on multivitamin with minerals tab and has been able to take.  If he becomes unable to take this orally then will give MVI and trace every other day via TPN.   Diet Order:  TPN (CLINIMIX-E) Adult .TPN (CLINIMIX-E) Adult  Skin:  Wound (see comment) (1/11 abdominal incision)  Last BM:  1/18  Height:   Ht Readings from Last 1 Encounters:  08/21/16 5\' 11"  (1.803 m)    Weight:   Wt Readings from Last 1 Encounters:  08/24/16 170 lb 6.7 oz (77.3 kg)    Ideal Body Weight:  78.2 kg  BMI:  Body mass index is 23.77 kg/m.  Estimated Nutritional Needs:   Kcal:  2200-2400  Protein:  110-120g  Fluid:  2.2-2.4L/day  EDUCATION NEEDS:   Education needs addressed  Clayton Bibles, MS, RD, LDN Pager: [phone] After Hours Pager: [phone]

## 2016-09-02 LAB — BASIC METABOLIC PANEL
Anion gap: 6 (ref 5–15)
BUN: 11 mg/dL (ref 6–20)
CALCIUM: 8.3 mg/dL — AB (ref 8.9–10.3)
CO2: 30 mmol/L (ref 22–32)
CREATININE: 0.73 mg/dL (ref 0.61–1.24)
Chloride: 100 mmol/L — ABNORMAL LOW (ref 101–111)
Glucose, Bld: 109 mg/dL — ABNORMAL HIGH (ref 65–99)
Potassium: 4.1 mmol/L (ref 3.5–5.1)
SODIUM: 136 mmol/L (ref 135–145)

## 2016-09-02 LAB — GLUCOSE, CAPILLARY
GLUCOSE-CAPILLARY: 132 mg/dL — AB (ref 65–99)
GLUCOSE-CAPILLARY: 107 mg/dL — AB (ref 65–99)
GLUCOSE-CAPILLARY: 137 mg/dL — AB (ref 65–99)
GLUCOSE-CAPILLARY: 85 mg/dL (ref 65–99)
Glucose-Capillary: 125 mg/dL — ABNORMAL HIGH (ref 65–99)

## 2016-09-02 LAB — PHOSPHORUS: PHOSPHORUS: 4.3 mg/dL (ref 2.5–4.6)

## 2016-09-02 LAB — MAGNESIUM: MAGNESIUM: 1.7 mg/dL (ref 1.7–2.4)

## 2016-09-02 MED ORDER — FAT EMULSION 20 % IV EMUL
240.0000 mL | INTRAVENOUS | Status: AC
  Administered 2016-09-02: 240 mL via INTRAVENOUS
  Filled 2016-09-02: qty 250

## 2016-09-02 MED ORDER — VITAL AF 1.2 CAL PO LIQD
1000.0000 mL | ORAL | Status: DC
  Administered 2016-09-02: 1000 mL
  Filled 2016-09-02: qty 1000

## 2016-09-02 MED ORDER — SODIUM CHLORIDE 0.9 % IV SOLN
250.0000 mg | Freq: Three times a day (TID) | INTRAVENOUS | Status: DC
  Administered 2016-09-02 – 2016-09-03 (×3): 250 mg via INTRAVENOUS
  Filled 2016-09-02 (×4): qty 5

## 2016-09-02 MED ORDER — TRACE MINERALS CR-CU-MN-SE-ZN 10-1000-500-60 MCG/ML IV SOLN
INTRAVENOUS | Status: AC
  Administered 2016-09-02: 19:00:00 via INTRAVENOUS
  Filled 2016-09-02: qty 1920

## 2016-09-02 NOTE — Progress Notes (Signed)
[place] NOTE   Pharmacy Consult for TPN Indication: intolerance to TF  Patient Measurements: Height: 5\' 11"  (180.3 cm) Weight: 170 lb 6.7 oz (77.3 kg) IBW/kg (Calculated) : 75.3 TPN AdjBW (KG): 73.9 Body mass index is 23.77 kg/m. Usual Weight:    Insulin Requirements: 12 units SSI in the past 24 hrs (7 units since new TPN bag hung) + 25 units regular insulin/day in TPN - Lantus increased to 18 units daily on 1/21.  Current Nutrition: Clinimix E 5/20 at goal 80 ml/hr. TF - held due to intolerance (possible resume 1/23)  IVF: NS + 20 meq KCl at 10 ml/hr  Central access: PICC 1/20  TPN start date: 1/20  ASSESSMENT                                                                                                      HPI:  41 YOM presented 1/11 for pancreaticoduodenectomy d/t pancreatic cancer.  He was been placed on TF post-operatively as well as on soft diet.  He complains of abdominal pain and vomiting with resuming TF.  Orders to place PICC and start TPN 1/20.   Significant events:   Today:    Glucose - history of DM on lantus prior to admission.  CBGs improved with increased insulin regimen; goal of < 150 mg/dl.  1/21: Surgery increased lantus from 15 units to 18 units daily, TRH decreased back down to 15 units starting 1/23.  Electrolytes - Na back to normal (low prior to start of TPN), Cl low but improved, Mg at low-end of goal, Corrected Ca WNL  Renal - SCr WNL/stable  LFTs - WNL (AST low)  TGs - WNL 89 (1/20)  Prealbumin - low 8.7 (1/21)  NUTRITIONAL GOALS                                                                                             RD recs: 2200-2400 Kcals, 110-120gm protein  Clinimix 5/20 at a goal rate of 42ml/hr + 20% fat emulsion at 74ml/hr over 12h provide: 96g/day protein, 2169 Kcal/day. This is only providing ~90% minimum protein needs, but plan is for short-term TPN until TF can be resumed which  will be able to provide 100% needs.  Once TF resumed, plan Vital AF 1.2 at goal 70 ml/hr to proved 2016 kcal, 126g protein per day.  PLAN  At 1800 today  Continue Clinimix E5/20 to 80 ml/hr (goal).  20% fat emulsion at 49ml/hr over 12hr.  Trace elements are currently on national backorder. TPN will NOT contain standard multivitamins and trace elements as patient is on multivitamin with minerals tab and has been able to take.  If he becomes unable to take this orally then will give MVI and trace every other day via TPN.   Patient refused today, therefore, will add it TPN today only  continue IVF to NS + 20 meq KCl  infuse at [place] regular insulin to 25 units based on SSI usage.  Continue Lantus 15 units SQ q24h per TRH.  TPN lab panels on Mondays & Thursdays, check BMET, Mg & Phos in AM.  F/u daily for TF initiation and tolerance, ability to wean and d/c TPN.  Peggyann Juba, PharmD, BCPS Pager: [phone] 09/02/2016 7:04 AM

## 2016-09-02 NOTE — Progress Notes (Signed)
Nutrition Brief Follow-up  Patient continues TPN: Clinimix E5/20to 32ml/hr and 20% fat emulsion at 43ml/hrover 12h, which provides 2169 kcal (99% of needs) and 96g protein (87% of needs). Patient reports feeling hungry. Per RN, MD has not made rounds yet.  RD to monitor for possible diet advancement and need for re-initiation of J-tube feeds.  Recommendations: Initiate Vital AF 1.2 @ 10 ml/hr, advance by 10 ml every 24 hours to goal rate of 70 ml/hr. At goal, this will provide 2016 kcal (92% of needs), 126 g protein, and 1417 ml H2O.  RD will continue to monitor for plan.  Clayton Bibles, MS, RD, LDN Pager: [phone] After Hours Pager: [phone]

## 2016-09-02 NOTE — Progress Notes (Signed)
Post-Op Whipple Subjective: Feeling hungry today.    Objective: Vital signs in last 24 hours: Temp:  [98.4 F (36.9 C)-99.3 F (37.4 C)] 98.4 F (36.9 C) (01/23 0536) Pulse Rate:  [93-104] 104 (01/23 0536) Resp:  [18] 18 (01/23 0536) BP: (113-133)/(66-78) 113/66 (01/23 0536) SpO2:  [100 %] 100 % (01/23 0536)   Intake/Output from previous day: 01/22 0701 - 01/23 0700 In: 857.7 [I.V.:857.7] Out: 2335 [Urine:2100; Drains:235] Intake/Output this shift: Total I/O In: -  Out: 540 [Urine:500; Drains:40]   General appearance: alert and cooperative GI: soft, nontender, JP: SS fluid Breathing comfortably Incision: c/d/i.   Lab Results:   Recent Labs  08/31/16 0555 09/01/16 0435  WBC 9.1 9.9  HGB 9.6* 9.6*  HCT 29.5* 30.3*  PLT 392 380   BMET  Recent Labs  09/01/16 0435 09/02/16 0440  NA 134* 136  K 4.0 4.1  CL 97* 100*  CO2 30 30  GLUCOSE 177* 109*  BUN 9 11  CREATININE 0.65 0.73  CALCIUM 8.5* 8.3*   PT/INR No results for input(s): LABPROT, INR in the last 72 hours. ABG No results for input(s): PHART, HCO3 in the last 72 hours.  Invalid input(s): PCO2, PO2  MEDS, Scheduled . enoxaparin (LOVENOX) injection  40 mg Subcutaneous Q24H  . erythromycin  250 mg Intravenous Q8H  . insulin aspart  0-15 Units Subcutaneous Q4H  . insulin glargine  15 Units Subcutaneous Daily  . multivitamin with minerals  1 tablet Oral Daily  . pantoprazole  40 mg Oral Daily  . polyethylene glycol  17 g Oral Daily  . potassium chloride  40 mEq Oral Daily  . senna-docusate  1 tablet Oral BID  . simethicone  80 mg Oral TID    Studies/Results: Ct Abdomen Pelvis W Contrast  Result Date: 08/31/2016 CLINICAL DATA:  Pain and nausea after Whipple procedure.  Hiccups. EXAM: CT ABDOMEN AND PELVIS WITH CONTRAST TECHNIQUE: Multidetector CT imaging of the abdomen and pelvis was performed using the standard protocol following bolus administration of intravenous contrast. CONTRAST:  21mL  ISOVUE-300 IOPAMIDOL (ISOVUE-300) INJECTION 61%, 1 ISOVUE-300 IOPAMIDOL (ISOVUE-300) INJECTION 61% COMPARISON:  06/05/2016 preoperative CT FINDINGS: Lower chest: Right basilar atelectasis. No effusion or pneumothorax. The visualized cardiac chambers are normal in size with coronary arteriosclerosis. No pericardial effusion. Hepatobiliary: Cholecystectomy with gas likely lucencies in the operative bed more often consistent with postoperative change. Pneumobilia consistent with recent Whipple procedure. Pancreas: Resected pancreatic head consistent with Whipple procedure with pancreatic duct stent is seen within the pancreatic duct extending into the anastomosed jejunum. Spleen: Intact without resection.  No splenomegaly. Adrenals/Urinary Tract: Normal bilateral adrenal glands. 4 mm right upper pole renal cyst too small to further characterize. No obstructive uropathy or nephrolithiasis on either side. Urinary bladder is physiologically distended. Stomach/Bowel: Resected gastric antrum and portions of the duodenum as part of a Whipple procedure. Moderate-to-marked gastric distention with fluid and food. No dehiscence identified at the hepaticojejunostomy, pancreaticojejunostomy nor gastro jejunostomy. Moderate fluid-filled jejunum which may represent a postoperative ileus. Distal small bowel appears contracted. No definite transition. Vascular/Lymphatic: Aortoiliac atherosclerosis. No aneurysm. No lymphadenopathy. Reproductive: Normal size prostate. Other: Two drains are identified, the more lateral adjacent to the choledochal jejunostomy site and the more medial in tibia and superior to the pancreaticojejunostomy. Left-sided feeding tube is seen within small bowel. Pancreatic duct stent is noted extending into the anastomosed jejunum. Musculoskeletal: Nonacute IMPRESSION: 1. Status post classic appearing Whipple procedure by dehiscence of suture sites. Marked gastric distention with fluid and  food may reflect delayed  gastric emptying. Fluid-filled jejunum has the appearance of postoperative ileus. No definite transition site is identified. 2. No sterile fluid collection or abscess. There are mottled gas like lucencies in the gallbladder fossa which may represent postop change. Electronically Signed   By: Ashley Royalty M.D.   On: 08/31/2016 19:21    Assessment: s/p Procedure(s): LAPAROSCOPY DIAGNOSTIC WHIPPLE PROCEDURE Patient Active Problem List   Diagnosis Date Noted  . Adenocarcinoma of head of pancreas ([place]) 08/21/2016  . Port catheter in place 04/08/2016  . 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 Epigastric pain 03/12/2016  . AKI (acute kidney injury) ([place]) 03/12/2016  . Diabetes mellitus with complication ([place]) 123XX123  . ETOH abuse 03/12/2016  . Sinus tachycardia 03/12/2016     Plan: S/p whipple  Delayed gastric emptying. Start erythromycin. Bariatric clears. Restart tube feeds.    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  DM - Fingersticks OK with lantus, SSI.  Increase lantus today.  Severe protein calorie malnutrition.  TNA.  Prealbumin is 8.7.  Oxycodone for pain control. Simethicone for gas pain.   D/c drains and staples.    LOS: 12 days      [facility], [place] [phone]   09/02/2016 11:41 AM

## 2016-09-03 LAB — BASIC METABOLIC PANEL
ANION GAP: 6 (ref 5–15)
BUN: 15 mg/dL (ref 6–20)
CALCIUM: 8.3 mg/dL — AB (ref 8.9–10.3)
CO2: 29 mmol/L (ref 22–32)
Chloride: 99 mmol/L — ABNORMAL LOW (ref 101–111)
Creatinine, Ser: 0.69 mg/dL (ref 0.61–1.24)
GFR calc Af Amer: >60 mL/min (ref >60)
GFR calc non Af Amer: >60 mL/min (ref >60)
GLUCOSE: 136 mg/dL — AB (ref 65–99)
Potassium: 4.3 mmol/L (ref 3.5–5.1)
Sodium: 134 mmol/L — ABNORMAL LOW (ref 135–145)

## 2016-09-03 LAB — GLUCOSE, CAPILLARY
GLUCOSE-CAPILLARY: 104 mg/dL — AB (ref 65–99)
GLUCOSE-CAPILLARY: 136 mg/dL — AB (ref 65–99)
GLUCOSE-CAPILLARY: 139 mg/dL — AB (ref 65–99)
GLUCOSE-CAPILLARY: 184 mg/dL — AB (ref 65–99)
GLUCOSE-CAPILLARY: 200 mg/dL — AB (ref 65–99)
Glucose-Capillary: 106 mg/dL — ABNORMAL HIGH (ref 65–99)
Glucose-Capillary: 129 mg/dL — ABNORMAL HIGH (ref 65–99)

## 2016-09-03 LAB — MAGNESIUM: Magnesium: 1.7 mg/dL (ref 1.7–2.4)

## 2016-09-03 LAB — PHOSPHORUS: Phosphorus: 3.9 mg/dL (ref 2.5–4.6)

## 2016-09-03 MED ORDER — METOCLOPRAMIDE HCL 5 MG/ML IJ SOLN
10.0000 mg | Freq: Three times a day (TID) | INTRAMUSCULAR | Status: DC
  Administered 2016-09-03 – 2016-09-04 (×3): 10 mg via INTRAVENOUS
  Filled 2016-09-03 (×4): qty 2

## 2016-09-03 MED ORDER — INSULIN REGULAR HUMAN 100 UNIT/ML IJ SOLN
INTRAVENOUS | Status: DC
  Administered 2016-09-03: 17:00:00 via INTRAVENOUS
  Filled 2016-09-03: qty 1920

## 2016-09-03 MED ORDER — VITAL AF 1.2 CAL PO LIQD
1000.0000 mL | ORAL | Status: DC
  Administered 2016-09-03: 1000 mL
  Filled 2016-09-03 (×2): qty 1000

## 2016-09-03 MED ORDER — BISACODYL 10 MG RE SUPP
10.0000 mg | Freq: Every day | RECTAL | Status: DC
  Administered 2016-09-03 – 2016-09-04 (×2): 10 mg via RECTAL
  Filled 2016-09-03 (×2): qty 1

## 2016-09-03 MED ORDER — PANCRELIPASE (LIP-PROT-AMYL) 12000-38000 UNITS PO CPEP
24000.0000 [IU] | ORAL_CAPSULE | Freq: Three times a day (TID) | ORAL | Status: DC
  Administered 2016-09-03 – 2016-09-04 (×3): 24000 [IU] via ORAL
  Filled 2016-09-03 (×3): qty 2

## 2016-09-03 MED ORDER — FAT EMULSION 20 % IV EMUL
240.0000 mL | INTRAVENOUS | Status: AC
  Administered 2016-09-03: 240 mL via INTRAVENOUS
  Filled 2016-09-03: qty 250

## 2016-09-03 NOTE — Progress Notes (Signed)
Nutrition Follow-up  DOCUMENTATION CODES:   Severe malnutrition in context of chronic illness  INTERVENTION:   Monitor magnesium, potassium, and phosphorus daily for at least 3 days, MD to replete as needed, as pt is at risk for refeeding syndrome given severe malnutrition, poor PO intake x >5 days, and low Mg/Phos levels.  TPN per Pharmacy   Continue Vital AF 1.2 @ 20 ml/hr via J-tube. 1/25: Advance to 30 ml/hr if patient is tolerating.  Goal rate: Vital 1.5 @ 70 ml/hr. Provides 2016 kcal (92% of needs), 126 g protein, and 1417 ml H2O. RD will continue to monitor  NUTRITION DIAGNOSIS:   Malnutrition related to chronic illness as evidenced by percent weight loss, energy intake < or equal to 75% for > or equal to 1 month, severe depletion of body fat, severe depletion of muscle mass.  Ongoing.  GOAL:   Patient will meet greater than or equal to 90% of their needs  Meeting with TPN.  MONITOR:   Labs, Weight trends, Skin, I & O's, Other (Comment) (TPN)  ASSESSMENT:   61 year old male who presents for a follow-up for Pancreatic cancer. Patient was diagnosed with pancreatic cancer in August 2017. Presenting symptoms were jaundice, pain, and weight loss. He has been getting neoadjuvant treatment and is here to discuss possible surgery. Follow up imaging was favorable. He has had a difficult time with chemotherapy. He has had significant fatigue and weakness, but is still able to get around the house.   Patient reports feeling "bloated", receiving scheduled IV Reglan. Pt has been drinking water only today and had some Miralax. TPN is goal rate: Clinimix E5/20to 49ml/hr and 20% fat emulsion at 54ml/hrover 12h (providing 2169 kcal (99% of needs) and 96g protein (87% of needs).) Recommend trickle feeds continue of Vital AF 1.2 @ 20 ml/hr. If patient's fullness improves would increase to 30 ml/hr.  Medications: IV Reglan every 8 hours, Multivitamin with minerals daily, Protonix  tablet daily, Miralax packet daily, K-DUR tablet daily, Senokot-S tablet BID, IV Zofran PRN Labs reviewed: Low Na Mg/Phos/ K WNL  Diet Order:  .TPN (CLINIMIX-E) Adult Diet bariatric clear liquid Room service appropriate? Yes; Fluid consistency: Thin TPN (CLINIMIX-E) Adult  Skin:  Wound (see comment) (1/11 abdominal incision)  Last BM:  1/18  Height:   Ht Readings from Last 1 Encounters:  08/21/16 5\' 11"  (1.803 m)    Weight:   Wt Readings from Last 1 Encounters:  08/24/16 170 lb 6.7 oz (77.3 kg)    Ideal Body Weight:  78.2 kg  BMI:  Body mass index is 23.77 kg/m.  Estimated Nutritional Needs:   Kcal:  2200-2400  Protein:  110-120g  Fluid:  2.2-2.4L/day  EDUCATION NEEDS:   Education needs addressed  Clayton Bibles, MS, RD, LDN Pager: [phone] After Hours Pager: [phone]

## 2016-09-03 NOTE — Progress Notes (Addendum)
[place] NOTE   Pharmacy Consult for TPN Indication: intolerance to TF  Patient Measurements: Height: 5\' 11"  (180.3 cm) Weight: 170 lb 6.7 oz (77.3 kg) IBW/kg (Calculated) : 75.3 TPN AdjBW (KG): 73.9 Body mass index is 23.77 kg/m. Usual Weight:    Insulin Requirements: 6 units SSI in the past 24 hrs + 25 units regular insulin/day in TPN - Lantus decreased to 15 units daily on 1/23.  Current Nutrition: Clinimix E 5/20 at goal 80 ml/hr. TF - held due to intolerance (resumed 1/23)  IVF: NS + 20 meq KCl at 10 ml/hr   Central access: PICC 1/20  TPN start date: 1/20  ASSESSMENT                                                                                                      HPI:  56 YOM presented 1/11 for pancreaticoduodenectomy d/t pancreatic cancer.  He was been placed on TF post-operatively as well as on soft diet.  He complains of abdominal pain and vomiting with resuming TF.  Orders to place PICC and start TPN 1/20.   Significant events:  1/23: delayed gastric emptying - started IV erythromycin (monitor for drug interactions). Note this is on national backorder and we currently have < 24h supply.   Today:    Glucose - history of DM on lantus prior to admission.  CBGs improved with increased insulin regimen; goal of < 150 mg/dl. CBgs acceptable, lowest value = mid 80's  1/21: Surgery increased lantus from 15 units to 18 units daily, TRH decreased back down to 15 units starting 1/23.  Electrolytes - Na and Cl slightly low, Mg at low-end of goal, Corrected Ca WNL  Renal - SCr WNL/stable  LFTs - WNL (AST low)  TGs - WNL 89 (1/20)  Prealbumin - low 8.7 (1/21)  NUTRITIONAL GOALS                                                                                             RD recs: 2200-2400 Kcals, 110-120gm protein  Clinimix 5/20 at a goal rate of 65ml/hr + 20% fat emulsion at 37ml/hr over 12h provide: 96g/day protein, 2169  Kcal/day. This is only providing ~90% minimum protein needs, but plan is for short-term TPN until TF can be resumed which will be able to provide 100% needs.  Once TF resumed, plan Vital AF 1.2 at goal 70 ml/hr to proved 2016 kcal, 126g protein per day.  PLAN  At 1800 today  Continue Clinimix E5/20 to 80 ml/hr (goal) as TF currently at 42ml/hr.  Await tolerance of TF at rate of at least 33ml/hr before reducing TPN  20% fat emulsion at 47ml/hr over 12hr.  Trace elements are currently on national backorder. TPN will NOT contain standard multivitamins and trace elements as patient is on multivitamin with minerals tab and has been able to take.  If he becomes unable to take this orally then will give MVI and trace every other day via TPN.   continue IVF to NS + 20 meq KCl  infuse at [place] regular insulin to 25 units based on SSI usage.  Continue Lantus 15 units SQ q24h per CCS.  TPN lab panels on Mondays & Thursdays, check BMET, Mg & Phos in AM.  F/u daily for TF initiation and tolerance, ability to wean and d/c TPN.  Doreene Eland, PharmD, BCPS.   Pager: [phone] 09/03/2016 7:35 AM

## 2016-09-03 NOTE — Progress Notes (Signed)
Post-Op Whipple Subjective: Felt full when tube feeds advanced this AM to 30 mL/hr.    Objective: Vital signs in last 24 hours: Temp:  [98.5 F (36.9 C)-99.2 F (37.3 C)] 99.2 F (37.3 C) (01/24 1400) Pulse Rate:  [89-95] 95 (01/24 1400) Resp:  [17-18] 17 (01/24 1400) BP: (116-127)/(72-79) 127/79 (01/24 1400) SpO2:  [100 %] 100 % (01/24 1400)   Intake/Output from previous day: 01/23 0701 - 01/24 0700 In: 2102.3 [P.O.:360; I.V.:1417.3; NG/GT:225; IV Piggyback:100] Out: 1265 [Urine:1200; Drains:65] Intake/Output this shift: Total I/O In: 480 [P.O.:480] Out: 400 [Urine:400]   General appearance: alert and cooperative GI: soft, nontender, drains out.   Breathing comfortably Incision: c/d/i.   Lab Results:   Recent Labs  09/01/16 0435  WBC 9.9  HGB 9.6*  HCT 30.3*  PLT 380   BMET  Recent Labs  09/02/16 0440 09/03/16 0500  NA 136 134*  K 4.1 4.3  CL 100* 99*  CO2 30 29  GLUCOSE 109* 136*  BUN 11 15  CREATININE 0.73 0.69  CALCIUM 8.3* 8.3*   PT/INR No results for input(s): LABPROT, INR in the last 72 hours. ABG No results for input(s): PHART, HCO3 in the last 72 hours.  Invalid input(s): PCO2, PO2  MEDS, Scheduled . enoxaparin (LOVENOX) injection  40 mg Subcutaneous Q24H  . insulin aspart  0-15 Units Subcutaneous Q4H  . insulin glargine  15 Units Subcutaneous Daily  . metoCLOPramide (REGLAN) injection  10 mg Intravenous Q8H  . multivitamin with minerals  1 tablet Oral Daily  . pantoprazole  40 mg Oral Daily  . polyethylene glycol  17 g Oral Daily  . potassium chloride  40 mEq Oral Daily  . senna-docusate  1 tablet Oral BID  . simethicone  80 mg Oral TID    Studies/Results: No results found.  Assessment: s/p Procedure(s): LAPAROSCOPY DIAGNOSTIC WHIPPLE PROCEDURE Patient Active Problem List   Diagnosis Date Noted  . Adenocarcinoma of head of pancreas ([place]) 08/21/2016  . Port catheter in place 04/08/2016  . cT2N1 pancreatic adenocarcinoma  of the pancreatic head s/p pancreaticoduodenectomy 08/21/2016 03/28/2016  . Epigastric pain 03/12/2016  . AKI (acute kidney injury) ([place]) 03/12/2016  . Diabetes mellitus with complication ([place]) 123XX123  . ETOH abuse 03/12/2016  . Sinus tachycardia 03/12/2016     Plan: S/p whipple  Delayed gastric emptying. Start erythromycin. Bariatric clears. Keep tube feeds where they are. Restart creon. Suppositories  Chronic anemia due to cancer, acute blood loss anemia, dilutional anemia; stable.  Recheck in AM  DM - Fingersticks OK with lantus, SSI.   Severe protein calorie malnutrition.  TNA.  Prealbumin is 8.7.  Oxycodone for pain control. Simethicone for gas pain.  See if home TNA is an option.      LOS: 13 days      [facility], [place] [phone]   09/03/2016 4:24 PMPatient ID: Craig [name], male   DOB: [birth year], 59 y.o.   MRN: [id number]

## 2016-09-04 LAB — CBC
HEMATOCRIT: 27 % — AB (ref 39.0–52.0)
HEMOGLOBIN: 8.7 g/dL — AB (ref 13.0–17.0)
MCH: 24.8 pg — ABNORMAL LOW (ref 26.0–34.0)
MCHC: 32.2 g/dL (ref 30.0–36.0)
MCV: 76.9 fL — AB (ref 78.0–100.0)
Platelets: 365 10*3/uL (ref 150–400)
RBC: 3.51 MIL/uL — AB (ref 4.22–5.81)
RDW: 14.9 % (ref 11.5–15.5)
WBC: 7.7 10*3/uL (ref 4.0–10.5)

## 2016-09-04 LAB — COMPREHENSIVE METABOLIC PANEL
ALBUMIN: 2.5 g/dL — AB (ref 3.5–5.0)
ALK PHOS: 72 U/L (ref 38–126)
ALT: 14 U/L — ABNORMAL LOW (ref 17–63)
ANION GAP: 5 (ref 5–15)
AST: 13 U/L — ABNORMAL LOW (ref 15–41)
BUN: 13 mg/dL (ref 6–20)
CALCIUM: 8.2 mg/dL — AB (ref 8.9–10.3)
CO2: 29 mmol/L (ref 22–32)
Chloride: 99 mmol/L — ABNORMAL LOW (ref 101–111)
Creatinine, Ser: 0.69 mg/dL (ref 0.61–1.24)
GFR calc Af Amer: >60 mL/min (ref >60)
GFR calc non Af Amer: >60 mL/min (ref >60)
Glucose, Bld: 142 mg/dL — ABNORMAL HIGH (ref 65–99)
Potassium: 4.2 mmol/L (ref 3.5–5.1)
SODIUM: 133 mmol/L — AB (ref 135–145)
Total Bilirubin: 0.4 mg/dL (ref 0.3–1.2)
Total Protein: 5.2 g/dL — ABNORMAL LOW (ref 6.5–8.1)

## 2016-09-04 LAB — PHOSPHORUS: Phosphorus: 3.7 mg/dL (ref 2.5–4.6)

## 2016-09-04 LAB — GLUCOSE, CAPILLARY
Glucose-Capillary: 116 mg/dL — ABNORMAL HIGH (ref 65–99)
Glucose-Capillary: 203 mg/dL — ABNORMAL HIGH (ref 65–99)
Glucose-Capillary: 119 mg/dL — ABNORMAL HIGH (ref 65–99)

## 2016-09-04 LAB — MAGNESIUM: Magnesium: 1.7 mg/dL (ref 1.7–2.4)

## 2016-09-04 MED ORDER — OXYCODONE HCL 5 MG PO TABS: 5.0000 mg | ORAL_TABLET | ORAL | 0 refills | Status: AC | PRN

## 2016-09-04 MED ORDER — VITAL AF 1.2 CAL PO LIQD: 1000.0000 mL | ORAL | 12 refills | Status: AC

## 2016-09-04 MED ORDER — METOCLOPRAMIDE HCL 10 MG PO TABS: 10.0000 mg | ORAL_TABLET | Freq: Four times a day (QID) | ORAL | 3 refills | Status: AC

## 2016-09-04 MED ORDER — INSULIN GLARGINE 100 UNIT/ML SOLOSTAR PEN: 16.0000 [IU] | PEN_INJECTOR | SUBCUTANEOUS | 11 refills | Status: AC

## 2016-09-04 MED ORDER — ONDANSETRON HCL 8 MG PO TABS: 8.0000 mg | ORAL_TABLET | Freq: Three times a day (TID) | ORAL | 1 refills | Status: AC | PRN

## 2016-09-04 MED ORDER — PANTOPRAZOLE SODIUM 40 MG PO TBEC: 40.0000 mg | DELAYED_RELEASE_TABLET | Freq: Every day | ORAL | 3 refills | Status: AC

## 2016-09-04 MED ORDER — HEPARIN SOD (PORK) LOCK FLUSH 100 UNIT/ML IV SOLN
250.0000 [IU] | INTRAVENOUS | Status: AC | PRN
  Administered 2016-09-04: 250 [IU]

## 2016-09-04 MED ORDER — OXYCODONE HCL 5 MG PO TABS: 5.0000 mg | ORAL_TABLET | ORAL | 0 refills | Status: DC | PRN

## 2016-09-04 MED ORDER — VITAL AF 1.2 CAL PO LIQD
1000.0000 mL | ORAL | Status: DC
  Filled 2016-09-04: qty 1000

## 2016-09-04 MED ORDER — METHOCARBAMOL 500 MG PO TABS: 500.0000 mg | ORAL_TABLET | Freq: Three times a day (TID) | ORAL | 2 refills | Status: AC | PRN

## 2016-09-04 MED ORDER — BISACODYL 10 MG RE SUPP: 10.0000 mg | Freq: Every day | RECTAL | 0 refills | Status: AC | PRN

## 2016-09-04 MED ORDER — SIMETHICONE 80 MG PO CHEW: 80.0000 mg | CHEWABLE_TABLET | Freq: Three times a day (TID) | ORAL | 0 refills | Status: AC

## 2016-09-04 MED ORDER — METOPROLOL TARTRATE 25 MG PO TABS: 12.5000 mg | ORAL_TABLET | Freq: Two times a day (BID) | ORAL | 1 refills | Status: AC

## 2016-09-04 NOTE — Progress Notes (Signed)
Patient was given discharge instructions and prescriptions. All questions were answered and patient was taken to main entrance in the wheelchair. [place]

## 2016-09-04 NOTE — Progress Notes (Signed)
Received a request to arrange [facility] [place] for TNA and enteral feedings. Contacted [facility] for TNA, patient and spouse chose [place] for [facility] services. Contacted [facility] to arrange. Anticipate d/c later today.

## 2016-09-04 NOTE — Progress Notes (Signed)
[place] NOTE   Pharmacy Consult for TPN Indication: intolerance to TF  Patient Measurements: Height: 5\' 11"  (180.3 cm) Weight: 170 lb 6.7 oz (77.3 kg) IBW/kg (Calculated) : 75.3 TPN AdjBW (KG): 73.9 Body mass index is 23.77 kg/m. Usual Weight:    Insulin Requirements: 15 units SSI in the past 24 hrs + 25 units regular insulin/day in TPN - Lantus decreased to 15 units daily on 1/23.  Current Nutrition: Clinimix E 5/20 at goal 80 ml/hr. TF - held due to intolerance (resumed 1/23) 1/24: TF resumed and orders to advance to goal of 93ml/hr but had discomfort when increased to 90ml/hr 1/25: TF orders changed to 88ml/hr and no orders to advance  IVF: D5 1/2NS + 59meq KCl at 23ml/hr   Central access: PICC 1/20  TPN start date: 1/20  ASSESSMENT                                                                                                      HPI:  9 YOM presented 1/11 for pancreaticoduodenectomy d/t pancreatic cancer.  He was been placed on TF post-operatively as well as on soft diet.  He complains of abdominal pain and vomiting with resuming TF.  Orders to place PICC and start TPN 1/20.   Significant events:  1/23: delayed gastric emptying - started IV erythromycin (monitor for drug interactions). Note this is on national backorder and we currently have < 24h supply.  1/24: erythromycin changed to metoclopramide IV 1/25: likely home with TPN today  Today:   Glucose - history of DM on lantus prior to admission.  CBGs elevated last 24h, suspect due to TF + TPN.    1/21: Surgery increased lantus from 15 units to 18 units daily, TRH decreased back down to 15 units starting 1/23.  Electrolytes - Na and Cl slightly low, Mg at low-end of goal, Corrected Ca WNL  Renal - SCr WNL/stable  LFTs - WNL (AST and ALT low)  TGs - WNL 89 (1/20)  Prealbumin - low 8.7 (1/21)  NUTRITIONAL GOALS                                                                                 RD recs: 2200-2400 Kcals, 110-120gm protein  Clinimix 5/20 at a goal rate of 36ml/hr + 20% fat emulsion at 31ml/hr over 12h provide: 96g/day protein, 2169 Kcal/day. This is only providing ~90% minimum protein needs, but plan is for short-term TPN until TF can be resumed which will be able to provide 100% needs.  Once TF resumed, plan Vital AF 1.2 at goal 70 ml/hr to proved 2016 kcal, 126g protein per day.  Vital AF 1.2 at 52ml/hr to provide 45gm protein and 720 kcals per day  PLAN  At 1800 today  Likely being discharged if TPN can be set up, d/w [facility] and they are able to make TPN for tonight so anticipating discharge  If patient to stay, would attempt to cycle TPN over 18hrs.  Adjust TPN macronutrients for TF due to HYPERglycemia.   Clinimix E5/20 (1457mL) over 18h cycle at following rates:  50 ml/hr 6p - 7p  85 ml/hr 7p - 11a  50 ml/hr 11a - 12p then off  20% fat emulsion at 67ml/hr over 12hr.  This formulation/rates would provide 73gm protein and 1644 Kcals  Trace elements are currently on national backorder. TPN will NOT contain standard multivitamins and trace elements as patient is on multivitamin with minerals tab and has been able to take.  If he becomes unable to take this orally then will give MVI and trace every other day via TPN.   IVF as per CCS  Continue regular insulin to 25 units based on SSI usage.  Continue Lantus 15 units SQ q24h per CCS.  Will need to watch CBGs closely and adjust insulin with cycling TPN   TPN lab panels on Mondays & Thursday  F/u daily for TF initiation and tolerance, ability to wean and d/c TPN.  Doreene Eland, PharmD, BCPS.   Pager: [phone] 09/04/2016 8:38 AM

## 2016-09-04 NOTE — Progress Notes (Signed)
Nutrition Follow-up  DOCUMENTATION CODES:   Severe malnutrition in context of chronic illness  INTERVENTION:   Monitor magnesium, potassium, and phosphorus daily for at least 3 days, MD to replete as needed, as pt is at risk for refeeding syndrome given severe malnutrition, poor PO intake x >5 days, and low Mg/Phos levels.  TPN per Pharmacy   Continue to advance Vital AF 1.2 every 24 hours to goal rate of 70 ml/hr via J-tube.  Goal rate: Vital 1.5 @ 70 ml/hr. Recommend additional free water flushes of 240 ml (8 oz) TID (720 ml total) via tube or PO. Provides 2016 kcal (92% of needs), 126 g protein, and 2137 ml H2O.  [facility] regarding tube feeding needs.  NUTRITION DIAGNOSIS:   Malnutrition related to chronic illness as evidenced by percent weight loss, energy intake < or equal to 75% for > or equal to 1 month, severe depletion of body fat, severe depletion of muscle mass.  Ongoing.  GOAL:   Patient will meet greater than or equal to 90% of their needs  Meeting with TPN.  MONITOR:   PO intake, Labs, Weight trends, TF tolerance, Skin, I & O's, Other (Comment) (TPN)  ASSESSMENT:   61 year old male who presents for a follow-up for Pancreatic cancer. Patient was diagnosed with pancreatic cancer in August 2017. Presenting symptoms were jaundice, pain, and weight loss. He has been getting neoadjuvant treatment and is here to discuss possible surgery. Follow up imaging was favorable. He has had a difficult time with chemotherapy. He has had significant fatigue and weakness, but is still able to get around the house.   Per discussion with [facility] nurse, pt expected to discharge home today on TPN and enteral feeds. RD recommends continuing Vital AF 1.2 at home given patient's failed attempts of tolerating both Glucerna and Osmolite formulations. Pt developed N/V and stomach distention following infusions of both formulas.  Currently pt is tolerating Vital AF 1.2 @ 20 ml/hr  (providing 576 kcal and 36g protein). [facility] to manage advancement at home.   Medications: Multivitamin with minerals daily, Protonix tablet daily, Miralax packet daily, K-DUR tablet daily, Senokot-S tablet BID, IV Zofran PRN, IV Phenergan PRN, CREON capsule TID, IV Reglan every 8 hours,  Labs reviewed: CBGs: 116-203 Low Na Mg/Phos/K  Diet Order:  Diet bariatric clear liquid Room service appropriate? Yes; Fluid consistency: Thin TPN (CLINIMIX-E) Adult  Skin:  Wound (see comment) (1/11 abdominal incision)  Last BM:  1/24  Height:   Ht Readings from Last 1 Encounters:  08/21/16 5\' 11"  (1.803 m)    Weight:   Wt Readings from Last 1 Encounters:  08/24/16 170 lb 6.7 oz (77.3 kg)    Ideal Body Weight:  78.2 kg  BMI:  Body mass index is 23.77 kg/m.  Estimated Nutritional Needs:   Kcal:  2200-2400  Protein:  110-120g  Fluid:  2.2-2.4L/day  EDUCATION NEEDS:   Education needs addressed  Clayton Bibles, MS, RD, LDN Pager: [phone] After Hours Pager: [phone]

## 2016-09-04 NOTE — Discharge Summary (Signed)
Physician Discharge Summary  Patient ID: Craig [name] MRN: [id number] DOB/AGE: [birth year] 61 y.o.  Admit date: 08/21/2016 Discharge date: 09/04/2016  Admission Diagnoses: Patient Active Problem List   Diagnosis Date Noted  . Adenocarcinoma of head of pancreas ([place]) 08/21/2016  . Port catheter in place 04/08/2016  . 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 Epigastric pain 03/12/2016  . AKI (acute kidney injury) ([place]) 03/12/2016  . Diabetes mellitus with complication ([place]) 123XX123  . ETOH abuse 03/12/2016  . 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  He went to the ICU post op per protocol.  He did well other than some tachycardia post operatively.  He also had to have some manipulation of pain medication.  He had NGT removed on POD 2.  He was started on clear liquids.  He had no nausea at that time.  Trophic tube feeds were started.  He was advanced to full liquids.  Once his tube feeds were advanced, he started having significant pain.  His tube feeds were on hold.    His drain output was relatively high, so although it looked serous, it was sent for lipase.  This was negative for pancreatic leak.  He remained afebrile and did not have leukocytosis.  A CT was obtained once his pain continued and he continued to have nausea and vomiting.  He was seen to have a very distended stomach.    He was made NPO.  An NGT was offered to offload the material in the stomach, but the patient refused.  Tube feeds were attempted to  readvance, but he continued to have difficulty advancing the volume.  He was left on small volume clear liquids.  He was restarted on Creon to potentially help with cramping.  He was on lantus throughout his stay and was on a lower dose than previous.  He required miralax and suppository to assist with constipation.    He was very ambulatory and did well with activity.  He was understandably frustrated with his delayed gastric emptying.  Once we had multiple unsuccessful attempts to advance feeds, he was left on TNA.  He was discharged to home on clears, TNA, and tube feeds at 25 mL/hr. Ideally we would get him on supplemental night feeds and oral nutrition and off TNA.  He was also placed on reglan to assist with gastroparesis.    Consults: nutrition.  Significant Diagnostic Studies: labs: see epic.  Cr 0.69, Albumin 2.5, WBC 7.7, HCT 27.0  Treatments: TPN and surgery: See above.   Discharge Exam: Blood pressure 132/84, pulse 76, temperature 98.2 F (36.8 C), temperature source Oral, resp. rate 20, height 5\' 11"  (1.803 m), weight 77.3 kg (170 lb 6.7 oz), SpO2 100 %. General appearance: alert and cooperative Resp: breathing comfortably Cardio: regular rate and rhythm GI: soft, non tender, non distended.  Some serous drainage at former JP sites.    Disposition: 01-Home or Self Care  Discharge Instructions    Ambulatory referral to Home Health    Complete by:  As directed    Please evaluate Craig [name] for admission to [facility].  Disciplines requested: Nursing  Services to provide: Double Lumen Central Venous Access, Hyperalimentation (TPN) , Evaluate and Other: tube feeds 25 mL/hr.  Attempt to advance VERY SLOWLY.  Diet - clear liquids as tolerated.    Physician to follow patient's care (the person listed here will be responsible for signing ongoing orders): Referring Provider  Requested Start of Care Date: today  I certify that this patient is under my care and that I, or  a Nurse Practitioner or Physician's Assistant working with me, had a face-to-face encounter that meets the physician face-to-face requirements with patient on 09/05/2015.   The encounter with the patient was in whole, or in part for the following medical condition(s) which is the primary reason for home health care (List medical condition). Pancreatic cancer, delayed gastric emptying, severe protein calorie malnutrition.    Special Instructions:   Does the patient have Medicare or Medicaid?:  No   The encounter with the patient was in whole, or in part, for the following medical condition, which is the primary reason for home health care:  pancreatic cancer, delayed gastric emptying, protein calorie malnutrition.   Reason for Medically Necessary Home Health Services:  Skilled Nursing- Assessment and Training for Infusion Therapy, Line Care, and Infection Control   My clinical findings support the need for the above services:  Cognitive impairments, dementia, or mental confusion  that make it unsafe to leave home   I certify that, based on my findings, the following services are medically necessary home health services:  Nursing   Further, I certify that my clinical findings support that this patient is homebound due to:  Unable to leave home safely without assistance   TPN per Pharmacy   Continue to advance Vital AF 1.2 every 24 hours to goal rate of 70 ml/hr via J-tube.  Goal rate: Vital 1.5 @ 70 ml/hr. Recommend additional free water flushes of 240 ml (8 oz) TID (720 ml total) via tube or PO. Provides 2016 kcal (92% of needs), 126 g protein, and 2137 ml H2O.  Spoke with [facility] regarding tube feeding needs.   Call MD for:  persistant nausea and vomiting    Complete by:  As directed    Call MD for:  redness, tenderness, or signs of infection (pain, swelling, redness, odor or green/yellow discharge around incision site)    Complete by:  As directed    Call MD for:  severe uncontrolled pain     Complete by:  As directed    Call MD for:  temperature >100.4    Complete by:  As directed    Change dressing (specify)    Complete by:  As directed    Dry dressing PRN to drain site and J tube site.   Increase activity slowly    Complete by:  As directed      Allergies as of 09/04/2016      Reactions   No Known Allergies       Medication List    TAKE these medications   ALEVE 220 MG tablet Generic drug:  naproxen sodium Take 440 mg by mouth daily as needed (pain).   bisacodyl 10 MG suppository Commonly known as:  DULCOLAX Place 1 suppository (10 mg total) rectally daily as needed for moderate constipation.   feeding supplement (VITAL AF 1.2 CAL) Liqd Place 1,000 mLs into feeding tube continuous.   folic acid 1 MG tablet Commonly known as:  FOLVITE Take 1 tablet (1 mg total) by mouth daily.   Insulin Glargine 100 UNIT/ML Solostar Pen  Commonly known as:  LANTUS SOLOSTAR Inject 16 Units into the skin every morning. What changed:  how much to take   lidocaine-prilocaine cream Commonly known as:  EMLA Apply to affected area once   methocarbamol 500 MG tablet Commonly known as:  ROBAXIN Take 1 tablet (500 mg total) by mouth every 8 (eight) hours as needed for muscle spasms.   metoCLOPramide 10 MG tablet Commonly known as:  REGLAN Take 1 tablet (10 mg total) by mouth 4 (four) times daily.   metoprolol tartrate 25 MG tablet Commonly known as:  LOPRESSOR Take 0.5 tablets (12.5 mg total) by mouth 2 (two) times daily.   ondansetron 8 MG tablet Commonly known as:  ZOFRAN Take 1 tablet (8 mg total) by mouth every 8 (eight) hours as needed (Nausea or vomiting).   oxyCODONE 5 MG immediate release tablet Commonly known as:  Oxy IR/ROXICODONE Take 1-2 tablets (5-10 mg total) by mouth every 4 (four) hours as needed for moderate pain, severe pain or breakthrough pain. What changed:  when to take this   pantoprazole 40 MG tablet Commonly known as:  PROTONIX Take 1 tablet  (40 mg total) by mouth daily.   potassium chloride SA 20 MEQ tablet Commonly known as:  K-DUR,KLOR-CON Take 1 tablet (20 mEq total) by mouth daily.   prochlorperazine 10 MG tablet Commonly known as:  COMPAZINE Take 1 tablet (10 mg total) by mouth every 8 (eight) hours as needed (Nausea or vomiting).   simethicone 80 MG chewable tablet Commonly known as:  MYLICON Chew 1 tablet (80 mg total) by mouth 3 (three) times daily.   thiamine 100 MG tablet Take 1 tablet (100 mg total) by mouth daily.   ZENPEP PO Take 2 capsules by mouth 3 (three) times daily.      Follow-up Information    [facility] Follow up.   Why:  TNA/enteral feeding Contact information: [place] [id number] [phone]           Signed: Stark Klein 09/04/2016, 5:32 PM

## 2016-09-04 NOTE — Progress Notes (Signed)
Post-Op [place] Subjective: Getting washed up.  Really wants to go home.  No n/v last night.    Objective: Vital signs in last 24 hours: Temp:  [98.6 F (37 C)-99.2 F (37.3 C)] 98.6 F (37 C) (01/25 0431) Pulse Rate:  [95-100] 100 (01/25 0431) Resp:  [17-18] 18 (01/25 0431) BP: (124-134)/(72-79) 124/72 (01/25 0431) SpO2:  [100 %] 100 % (01/25 0431)   Intake/Output from previous day: 01/24 0701 - 01/25 0700 In: 2821.3 [P.O.:720; I.V.:1863.3; NG/GT:238] Out: 1650 [Urine:1650] Intake/Output this shift: No intake/output data recorded.   General appearance: alert and cooperative.  Ambulating.   GI: soft, nontender, drains out.   Breathing comfortably    Lab Results:   Recent Labs  09/04/16 0430  WBC 7.7  HGB 8.7*  HCT 27.0*  PLT 365   BMET  Recent Labs  09/03/16 0500 09/04/16 0430  NA 134* 133*  K 4.3 4.2  CL 99* 99*  CO2 29 29  GLUCOSE 136* 142*  BUN 15 13  CREATININE 0.69 0.69  CALCIUM 8.3* 8.2*   PT/INR No results for input(s): LABPROT, INR in the last 72 hours. ABG No results for input(s): PHART, HCO3 in the last 72 hours.  Invalid input(s): PCO2, PO2  MEDS, Scheduled . bisacodyl  10 mg Rectal Daily  . enoxaparin (LOVENOX) injection  40 mg Subcutaneous Q24H  . insulin aspart  0-15 Units Subcutaneous Q4H  . insulin glargine  15 Units Subcutaneous Daily  . lipase/protease/amylase  24,000 Units Oral TID AC  . metoCLOPramide (REGLAN) injection  10 mg Intravenous Q8H  . multivitamin with minerals  1 tablet Oral Daily  . pantoprazole  40 mg Oral Daily  . polyethylene glycol  17 g Oral Daily  . potassium chloride  40 mEq Oral Daily  . senna-docusate  1 tablet Oral BID  . simethicone  80 mg Oral TID    Studies/Results: No results found.  Assessment: s/p Procedure(s): LAPAROSCOPY DIAGNOSTIC WHIPPLE PROCEDURE Patient Active Problem List   Diagnosis Date Noted  . Adenocarcinoma of head of pancreas ([place]) 08/21/2016  . Port catheter in place  04/08/2016  . 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 Epigastric pain 03/12/2016  . AKI (acute kidney injury) ([place]) 03/12/2016  . Diabetes mellitus with complication ([place]) 123XX123  . ETOH abuse 03/12/2016  . Sinus tachycardia 03/12/2016     Plan: S/p whipple  Delayed gastric emptying. Start erythromycin. Bariatric clears. Try slow increase on tube feeds again.  Not sure that we will be able to get these to goal before discharge.   Restart creon. Suppositories  Chronic anemia due to cancer, acute blood loss anemia, dilutional anemia; stable.  Recheck in AM  DM - Fingersticks OK with lantus, SSI.   Severe protein calorie malnutrition.  TNA.  Prealbumin is 8.7.  Oxycodone for pain control. Simethicone for gas pain.  See if home TNA is an option.      LOS: 14 days      [facility], [place] [phone]   09/04/2016 8:12 AM

## 2016-09-05 ENCOUNTER — Emergency Department (HOSPITAL_COMMUNITY): Payer: BLUE CROSS/BLUE SHIELD

## 2016-09-05 ENCOUNTER — Emergency Department (HOSPITAL_COMMUNITY)
Admission: EM | Admit: 2016-09-05 | Discharge: 2016-09-05 | Disposition: A | Discharge status: Home / Self Care | Payer: BLUE CROSS/BLUE SHIELD | Attending: Emergency Medicine | Admitting: Emergency Medicine

## 2016-09-05 ENCOUNTER — Encounter (HOSPITAL_COMMUNITY): Payer: Self-pay | Admitting: Emergency Medicine

## 2016-09-05 DIAGNOSIS — Z87891 Personal history of nicotine dependence: Secondary | ICD-10-CM | POA: Insufficient documentation

## 2016-09-05 DIAGNOSIS — R1084 Generalized abdominal pain: Secondary | ICD-10-CM | POA: Insufficient documentation

## 2016-09-05 DIAGNOSIS — Z794 Long term (current) use of insulin: Secondary | ICD-10-CM | POA: Insufficient documentation

## 2016-09-05 DIAGNOSIS — I10 Essential (primary) hypertension: Secondary | ICD-10-CM | POA: Insufficient documentation

## 2016-09-05 DIAGNOSIS — E119 Type 2 diabetes mellitus without complications: Secondary | ICD-10-CM | POA: Insufficient documentation

## 2016-09-05 DIAGNOSIS — R109 Unspecified abdominal pain: Secondary | ICD-10-CM | POA: Diagnosis present

## 2016-09-05 LAB — COMPREHENSIVE METABOLIC PANEL
ALT: 19 U/L (ref 17–63)
ANION GAP: 4 — AB (ref 5–15)
AST: 18 U/L (ref 15–41)
Albumin: 2.6 g/dL — ABNORMAL LOW (ref 3.5–5.0)
Alkaline Phosphatase: 73 U/L (ref 38–126)
BUN: 10 mg/dL (ref 6–20)
CHLORIDE: 101 mmol/L (ref 101–111)
CO2: 29 mmol/L (ref 22–32)
CREATININE: 0.58 mg/dL — AB (ref 0.61–1.24)
Calcium: 8.4 mg/dL — ABNORMAL LOW (ref 8.9–10.3)
GFR calc non Af Amer: >60 mL/min (ref >60)
Glucose, Bld: 182 mg/dL — ABNORMAL HIGH (ref 65–99)
POTASSIUM: 4.1 mmol/L (ref 3.5–5.1)
Sodium: 134 mmol/L — ABNORMAL LOW (ref 135–145)
Total Bilirubin: 0.2 mg/dL — ABNORMAL LOW (ref 0.3–1.2)
Total Protein: 5.7 g/dL — ABNORMAL LOW (ref 6.5–8.1)

## 2016-09-05 LAB — CBC WITH DIFFERENTIAL/PLATELET
Basophils Absolute: 0 10*3/uL (ref 0.0–0.1)
Basophils Relative: 1 %
EOS ABS: 0.5 10*3/uL (ref 0.0–0.7)
Eosinophils Relative: 6 %
HCT: 27.3 % — ABNORMAL LOW (ref 39.0–52.0)
HEMOGLOBIN: 8.9 g/dL — AB (ref 13.0–17.0)
LYMPHS ABS: 0.8 10*3/uL (ref 0.7–4.0)
LYMPHS PCT: 11 %
MCH: 25.4 pg — ABNORMAL LOW (ref 26.0–34.0)
MCHC: 32.6 g/dL (ref 30.0–36.0)
MCV: 78 fL (ref 78.0–100.0)
Monocytes Absolute: 0.6 10*3/uL (ref 0.1–1.0)
Monocytes Relative: 9 %
NEUTROS PCT: 73 %
Neutro Abs: 5.3 10*3/uL (ref 1.7–7.7)
Platelets: 370 10*3/uL (ref 150–400)
RBC: 3.5 MIL/uL — AB (ref 4.22–5.81)
RDW: 15.2 % (ref 11.5–15.5)
WBC: 7.2 10*3/uL (ref 4.0–10.5)

## 2016-09-05 LAB — LIPASE, BLOOD: Lipase: 15 U/L (ref 11–51)

## 2016-09-05 MED ORDER — HYDROMORPHONE HCL 1 MG/ML IJ SOLN
1.0000 mg | Freq: Once | INTRAMUSCULAR | Status: AC
  Administered 2016-09-05: 1 mg via INTRAVENOUS
  Filled 2016-09-05: qty 1

## 2016-09-05 MED ORDER — LORAZEPAM 1 MG PO TABS: 0.5000 mg | ORAL_TABLET | Freq: Three times a day (TID) | ORAL | 0 refills | Status: AC | PRN

## 2016-09-05 MED ORDER — SODIUM CHLORIDE 0.9% FLUSH: 10.0000 mL | INTRAVENOUS | Status: DC | PRN

## 2016-09-05 NOTE — ED Notes (Signed)
ED Provider at bedside. 

## 2016-09-05 NOTE — ED Notes (Signed)
Surgery at bedside.

## 2016-09-05 NOTE — Discharge Instructions (Addendum)
-   Make sure that you are taking Reglan as prescribed, it will help to facilitate gut motility. - Limit oral intake. You are getting plenty of nutrition from TPN and tube feeds, and do not need to eat/drink to get nutrition. The more you eat/drink could make your pain worse. - You are likely going to have intermittent left sided abdominal pain for a couple weeks to a month. Unfortunately this is part of the healing process from your surgery.  - If the tube feeds make your pain worse it is ok to take a break from those and get all your nutrition from TPN instead. - Try using the robaxin (muscle relaxer) for cramping abdominal pain. You could also apply a heating pad to your abdomen. - Please call our office if you have any concerns, there is always a doctor on call that can hel

## 2016-09-05 NOTE — ED Provider Notes (Signed)
[facility] DEPT Provider Note   CSN: [id number] Arrival date & time: 09/05/16  [id number]     History   Chief Complaint Chief Complaint  Patient presents with  . Abdominal Pain    HPI Craig [name] is a 61 y.o. male.  The history is provided by the patient. No language interpreter was used.  Abdominal Pain     Craig [name] is a 62 y.o. male who presents to the Emergency Department complaining of abdominal pain.  He has a history of pancreatic cancer and was discharged home from the hospital yesterday following pancreaticoduodenectomy. He was discharged home on TPN via PICC line. He also has a PEG tube in the left upper quadrant. He was doing well at home when this morning he took his oral medications and about 30 minutes later developed severe left-sided abdominal pain. No fevers, vomiting, diarrhea. His pain has improved overall since it began this morning.  Past Medical History:  Diagnosis Date  . Diabetes mellitus without complication ([place])   . ETOH abuse   . Hypertension    off bp meds 3 4- months ago  . Primary pancreatic adenocarcinoma ([place]) 03/28/2016  . Type 2 diabetes mellitus [facility])     Patient Active Problem List   Diagnosis Date Noted  . Adenocarcinoma of head of pancreas ([place]) 08/21/2016  . Port catheter in place 04/08/2016  . 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 Epigastric pain 03/12/2016  . AKI (acute kidney injury) ([place]) 03/12/2016  . Diabetes mellitus with complication ([place]) [id number]  . ETOH abuse 03/12/2016  . Sinus tachycardia 03/12/2016    Past Surgical History:  Procedure Laterality Date  . ERCP Left 03/13/2016   Procedure: ENDOSCOPIC RETROGRADE CHOLANGIOPANCREATOGRAPHY (ERCP);  Surgeon: Carol Ada, MD;  Location: [facility] ENDOSCOPY;  Service: Endoscopy;  Laterality: Left;  . EUS N/A 03/20/2016   Procedure: UPPER ENDOSCOPIC ULTRASOUND (EUS) LINEAR;  Surgeon: Carol Ada, MD;   Location: [facility] ENDOSCOPY;  Service: Endoscopy;  Laterality: N/A;  . HERNIA REPAIR    . KNEE ARTHROSCOPY     left and right  . LAPAROSCOPY N/A 08/21/2016   Procedure: LAPAROSCOPY DIAGNOSTIC;  Surgeon: Stark Klein, MD;  Location: [facility] ORS;  Service: General;  Laterality: N/A;  . PORTACATH PLACEMENT N/A 04/07/2016   Procedure: INSERTION PORT-A-CATH;  Surgeon: Stark Klein, MD;  Location: [facility];  Service: General;  Laterality: N/A;  . WHIPPLE PROCEDURE N/A 08/21/2016   Procedure: WHIPPLE PROCEDURE;  Surgeon: Stark Klein, MD;  Location: [facility] ORS;  Service: General;  Laterality: N/A;       Home Medications    Prior to Admission medications   Medication Sig Start Date End Date Taking? Authorizing Provider  bisacodyl (DULCOLAX) 10 MG suppository Place 1 suppository (10 mg total) rectally daily as needed for moderate constipation. 09/04/16  Yes Stark Klein, MD  folic acid (FOLVITE) 1 MG tablet Take 1 tablet (1 mg total) by mouth daily. 04/25/16  Yes Truitt Merle, MD  Insulin Glargine (LANTUS SOLOSTAR) 100 UNIT/ML Solostar Pen Inject 16 Units into the skin every morning. 09/04/16  Yes Stark Klein, MD  lidocaine-prilocaine (EMLA) cream Apply to affected area once 04/04/16  Yes Truitt Merle, MD  methocarbamol (ROBAXIN) 500 MG tablet Take 1 tablet (500 mg total) by mouth every 8 (eight) hours as needed for muscle spasms. 09/04/16  Yes Stark Klein, MD  metoCLOPramide (REGLAN) 10 MG tablet Take 1 tablet (10 mg total) by mouth 4 (four) times daily. 09/04/16  Yes Stark Klein, MD  metoprolol tartrate (LOPRESSOR) 25 MG tablet Take 0.5 tablets (12.5 mg total) by mouth 2 (two) times daily. 09/04/16  Yes Stark Klein, MD  naproxen sodium (ALEVE) 220 MG tablet Take 440 mg by mouth daily as needed (pain).    Yes Historical Provider, MD  Nutritional Supplements (FEEDING SUPPLEMENT, VITAL AF 1.2 CAL,) LIQD Place 1,000 mLs into feeding tube continuous. 09/04/16  Yes Stark Klein, MD  ondansetron (ZOFRAN) 8 MG tablet Take 1 tablet (8  mg total) by mouth every 8 (eight) hours as needed (Nausea or vomiting). 09/04/16  Yes Stark Klein, MD  oxyCODONE (OXY IR/ROXICODONE) 5 MG immediate release tablet Take 1-2 tablets (5-10 mg total) by mouth every 4 (four) hours as needed for moderate pain, severe pain or breakthrough pain. 09/04/16  Yes Brooke A Miller, PA-C  Pancrelipase, Lip-Prot-Amyl, (ZENPEP PO) Take 2 capsules by mouth 3 (three) times daily.   Yes Historical Provider, MD  pantoprazole (PROTONIX) 40 MG tablet Take 1 tablet (40 mg total) by mouth daily. 09/04/16  Yes Stark Klein, MD  potassium chloride SA (K-DUR,KLOR-CON) 20 MEQ tablet Take 1 tablet (20 mEq total) by mouth daily. 04/25/16  Yes Truitt Merle, MD  prochlorperazine (COMPAZINE) 10 MG tablet Take 1 tablet (10 mg total) by mouth every 8 (eight) hours as needed (Nausea or vomiting). 04/04/16  Yes Truitt Merle, MD  simethicone (MYLICON) 80 MG chewable tablet Chew 1 tablet (80 mg total) by mouth 3 (three) times daily. 09/04/16  Yes Stark Klein, MD  thiamine 100 MG tablet Take 1 tablet (100 mg total) by mouth daily. 04/25/16  Yes Truitt Merle, MD  LORazepam (ATIVAN) 1 MG tablet Take 0.5-1 tablets (0.5-1 mg total) by mouth 3 (three) times daily as needed for anxiety. 09/05/16   Quintella Reichert, MD    Family History Family History  Problem Relation Age of Onset  . Heart attack Mother     Social History Social History  Substance Use Topics  . Smoking status: Former Smoker    Packs/day: 1.00    Years: 15.00    Quit date: 08/12/1995  . Smokeless tobacco: Never Used  . Alcohol use Yes     Comment: he used to drink 6 pack beer a day for 40 years, quit in 02/2016      Allergies   No known allergies   Review of Systems Review of Systems  Gastrointestinal: Positive for abdominal pain.  All other systems reviewed and are negative.    Physical Exam Updated Vital Signs BP 121/76 (BP Location: Left Arm)   Pulse 89   Temp 99.1 F (37.3 C) (Oral)   Resp 18   SpO2 99%   Physical  Exam  Constitutional: He is oriented to person, place, and time. He appears well-developed and well-nourished.  HENT:  Head: Normocephalic and atraumatic.  Cardiovascular: Normal rate and regular rhythm.   No murmur heard. Pulmonary/Chest: Effort normal and breath sounds normal. No respiratory distress.  Abdominal: Soft.  Healing midline surgical incision. Gastrostomy tube in the left upper quadrant. Mild diffuse abdominal tenderness, greatest in the left hemiabdomen. No guarding or rebound tenderness.  Musculoskeletal: He exhibits no edema or tenderness.  PICC line in the right upper arm with dressing clean, dry, intact  Neurological: He is alert and oriented to person, place, and time.  Skin: Skin is warm and dry.  Psychiatric: He has a normal mood and affect. His behavior is normal.  Nursing note and vitals reviewed.    ED  Treatments / Results  Labs (all labs ordered are listed, but only abnormal results are displayed) Labs Reviewed  COMPREHENSIVE METABOLIC PANEL - Abnormal; Notable for the following:       Result Value   Sodium 134 (*)    Glucose, Bld 182 (*)    Creatinine, Ser 0.58 (*)    Calcium 8.4 (*)    Total Protein 5.7 (*)    Albumin 2.6 (*)    Total Bilirubin 0.2 (*)    Anion gap 4 (*)    All other components within normal limits  CBC WITH DIFFERENTIAL/PLATELET - Abnormal; Notable for the following:    RBC 3.50 (*)    Hemoglobin 8.9 (*)    HCT 27.3 (*)    MCH 25.4 (*)    All other components within normal limits  LIPASE, BLOOD    EKG  EKG Interpretation None       Radiology Dg Abd Acute W/chest  Result Date: 09/05/2016 CLINICAL DATA:  LLQ pain that started after 30 minutes taking medications by peg tube rtoday. Pt reports he has not had a good BM in 3 weeks. Pt also reports he was discharged yesterday from hospital after getting an enema.; diabetic; HTN; hx pancreatic CA; ex smoker; EXAM: DG ABDOMEN ACUTE W/ 1V CHEST COMPARISON:  CT, 08/31/2016  FINDINGS: There is no bowel dilation to suggest obstruction or significant generalized adynamic ileus. There are scattered nonspecific air-fluid levels. There is no free air. Several surgical vascular clips are noted in the right upper quadrant. There is a all small bowel catheter projecting in the lower abdomen, stable from the prior CT. Soft tissues are otherwise unremarkable. Normal heart, mediastinum and hila.  The lungs are clear. Right PICC tip projects in the lower superior vena cava. Left Port-A-Cath catheter tip projects in the mid to lower superior vena cava. No acute skeletal abnormality. IMPRESSION: 1. No evidence of bowel obstruction, significant generalized adynamic ileus or free air. 2. Few nonspecific scattered air-fluid levels. 3. No active cardiopulmonary disease. Electronically Signed   By: Lajean Manes M.D.   On: 09/05/2016 12:07    Procedures Procedures (including critical care time)  Medications Ordered in ED Medications  HYDROmorphone (DILAUDID) injection 1 mg (1 mg Intravenous Given 09/05/16 1208)     Initial Impression / Assessment and Plan / ED Course  I have reviewed the triage vital signs and the nursing notes.  Pertinent labs & imaging results that were available during my care of the patient were reviewed by me and considered in my medical decision making (see chart for details).     Patient with history of recent pancreatic surgery here with episode of abdominal pain after taking his home medications. He has mild tenderness on examination with no peritoneal findings. Labs are near his baseline. There is no evidence of perforation or bowel obstruction based on history or examination. He has been evaluated by the general surgery service in the emergency department. Plan to continue home care with current medications, outpatient follow-up and return precautions. Will provide prescription for Ativan for anxiety as patient is having difficulty with sleep and increased  anxiety and stress given his recent diagnosis.  Final Clinical Impressions(s) / ED Diagnoses   Final diagnoses:  Generalized abdominal pain    New Prescriptions Discharge Medication List as of 09/05/2016  1:07 PM    START taking these medications   Details  LORazepam (ATIVAN) 1 MG tablet Take 0.5-1 tablets (0.5-1 mg total) by mouth 3 (three) times daily  as needed for anxiety., Starting Fri 09/05/2016, Print         Quintella Reichert, MD 09/05/16 [phone]

## 2016-09-05 NOTE — ED Notes (Signed)
Bed: [id number] Expected date:  Expected time:  Means of arrival:  Comments: EMS-61 yo male, abd pain, constipation

## 2016-09-05 NOTE — ED Triage Notes (Signed)
Per [facility]. Pt from home reports LLQ pain that started after 30 minutes taking medications by peg tube. Pt reports he has not had a good BM in 3 weeks. Pt also reports he was discharged yesterday from hospital after getting an enema.

## 2016-09-05 NOTE — Progress Notes (Signed)
ED CM was contacted by [place] infusion coordinator [facility] about pt return to ED for increased abdominal pain at 0855 Pt monitored  Pt ready  For discharge and ED CM updated P [place] and left CM mobile #

## 2016-09-09 ENCOUNTER — Telehealth: Payer: Self-pay | Admitting: *Deleted

## 2016-09-11 ENCOUNTER — Other Ambulatory Visit: Payer: Self-pay | Admitting: Hematology

## 2016-09-11 ENCOUNTER — Other Ambulatory Visit: Payer: Self-pay | Admitting: *Deleted

## 2016-09-11 ENCOUNTER — Telehealth: Payer: Self-pay | Admitting: *Deleted

## 2016-09-11 NOTE — Telephone Encounter (Signed)
Received vm call from [facility] asking when port last flushed & If OK to flush .  Called & spoke to [facility] & informed OK to flush port per Dr Burr Medico.  Last flushed here in Dec, & flush q 4-6 weeks with 10 cc NS & 500 units Heparin.

## 2016-09-17 NOTE — Progress Notes (Signed)
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 Question loss of fat plane between the posterior aspect of mass and IVC. Mildly dilated CBD 10 mm.      03/13/2016 Procedure    ERCP and medical stem placement in CBD      03/20/2016 Initial Diagnosis    Primary pancreatic adenocarcinoma ([place])      03/20/2016 Initial Biopsy    FNA of the pancreatic head mass from EUS showed a malignant cells consistent with adenocarcinoma      03/20/2016 Procedure    EUS showed an irregular mass in the pancreatic head, measuring 3.9 x 3.2 cm, no invading of the celiac trunk or portal vein. The mass was biopsied.       04/09/2016 -  Neo-Adjuvant Chemotherapy    Gemcitabine and Abraxane on Day 1, 8 every 21 days       06/05/2016 Imaging    CT CAP w Contrast 06/05/16 IMPRESSION: Mildly decreased size of mass within pancreatic head and uncinateprocess, with common bile duct stent in appropriate position. No evidence of metastatic disease or other acute findings within thechest, abdomen, or  pelvis.      08/31/2016 Imaging    CT CAP w Contrast 08/31/16 IMPRESSION: 1. Status post classic appearing Whipple procedure by dehiscence of suture sites. Marked gastric distention with fluid and food may reflect delayed gastric emptying. Fluid-filled jejunum has the appearance of postoperative ileus. No definite transition site is identified. 2. No sterile fluid collection or abscess. There are mottled gas like lucencies in the gallbladder fossa which may represent postop change.        HISTORY OF PRESENTING ILLNESS:  Craig [name] 61 y.o. male is here because of His newly diagnosed pancreatic cancer. He is coming up by his wife to our multidisciplinary check clinic today.  He has had epigastric pain for the last 4-6 weeks. He describes the pain is intermittent, last a few hours and a few times a day, located in the low mid chest and upper abdomen, not related to eating or position. He denies nausea, cough, or dyspnea. His appetite has decreased lately, and he complains about moderate fatigue, he was not able to tolerate his physically demanding job, and he has been out of week for 3 weeks. He lost about 30-40 lbs in the past 2 months.   He was seen by his primary care physician a few times, and he developed jaundice, and abnormal liver function. He was sent to Hospital by his primary care physician. During his hospital stay, CT scan reviewed a pancreatic head mass, and  mild dilatation of bile duct. He was seen by GI Dr. Jacelyn Grip, underwent ERCP and metal stent placement in CBD. He subsequently underwent EUS which showed an irregular mass in the pancreatic head, measuring 3.9 cm, no invading of the celiac trunk or portal vein. The mass was biopsied, which showed adenocarcinoma.  He is married, lives with his wife. His abdominal pain, fatigue has not changed much since the CBD stent placement. He is able to tolerate routine activities, no other new complaints. He has been drinking beer moderately  for the past 40 years, but stopped a few weeks ago.   CURRENT THERAPY: Pending adjuvant chemotherapy with Gemcitabine and Xeloda  INTERIM HISTORY: Craig [name] returns for follow-up and treatment. The patient has a TPN in place. He has a PEG tube, but states when he was getting more food through it, he felt sick. He is not using his PEG at this time. He drinks soup PO. He does not take Glucerna or Ensure due to it running his sugar up. He will see Dr. Barry Dienes on Wednesday. He denies pain. He had constipation and started using suppositories. He reports having a good bowel movement recently, but reports weight loss after using the suppositories. Reports fatigue, but able to perform everyday activities. His wife report he sleeps poorly. The patient's wife states he is depressed.  The patient underwent a Whipple procedure/resection of the pancreas, liver biopsy, and lymph node biopsy on 08/21/16 by Dr. Barry Dienes. Liver biopsy was benign. Whipple procedure revealed moderately differentiated ampullary invasive adenocarcinoma measuring 2.8 cm. The tumor invaded the peripancreatic tissue and the pancreas > 0.5 cm. The resection margins were negative. 1/19 biopsied lymph nodes were positive.  MEDICAL HISTORY:  Past Medical History:  Diagnosis Date  . Diabetes mellitus without complication ([place])   . ETOH abuse   . Hypertension    off bp meds 3 4- months ago  . Primary pancreatic adenocarcinoma ([place]) 03/28/2016  . Type 2 diabetes mellitus ([place])     SURGICAL HISTORY: Past Surgical History:  Procedure Laterality Date  . ERCP Left 03/13/2016   Procedure: ENDOSCOPIC RETROGRADE CHOLANGIOPANCREATOGRAPHY (ERCP);  Surgeon: Carol Ada, MD;  Location: [facility] ENDOSCOPY;  Service: Endoscopy;  Laterality: Left;  . EUS N/A 03/20/2016   Procedure: UPPER ENDOSCOPIC ULTRASOUND (EUS) LINEAR;  Surgeon: Carol Ada, MD;  Location: [facility] ENDOSCOPY;  Service: Endoscopy;  Laterality: N/A;  . HERNIA REPAIR    . KNEE ARTHROSCOPY      left and right  . LAPAROSCOPY N/A 08/21/2016   Procedure: LAPAROSCOPY DIAGNOSTIC;  Surgeon: Stark Klein, MD;  Location: [facility] ORS;  Service: General;  Laterality: N/A;  . PORTACATH PLACEMENT N/A 04/07/2016   Procedure: INSERTION PORT-A-CATH;  Surgeon: Stark Klein, MD;  Location: [facility];  Service: General;  Laterality: N/A;  . WHIPPLE PROCEDURE N/A 08/21/2016   Procedure: WHIPPLE PROCEDURE;  Surgeon: Stark Klein, MD;  Location: [facility] ORS;  Service: General;  Laterality: N/A;    SOCIAL HISTORY: Social History   Social History  . Marital status: Married    Spouse name: N/A  . Number of children: N/A  . Years of education: N/A   Occupational History  . Not on file.   Social History Main Topics  . Smoking status: Former Smoker    Packs/day: 1.00    Years: 15.00    Quit date: 08/12/1995  . Smokeless tobacco: Never Used  . Alcohol use Yes     Comment: he used to drink 6 pack beer a day for 40 years,  quit in 02/2016   . Drug use: No  . Sexual activity: Not on file   Other Topics Concern  . Not on file   Social History Narrative  . No narrative on file    FAMILY HISTORY: Family History  Problem Relation Age of Onset  . Heart attack Mother     ALLERGIES:  is allergic to no known allergies.  MEDICATIONS:  Current Outpatient Prescriptions  Medication Sig Dispense Refill  . bisacodyl (DULCOLAX) 10 MG suppository Place 1 suppository (10 mg total) rectally daily as needed for moderate constipation. 12 suppository 0  . folic acid (FOLVITE) 1 MG tablet TAKE ONE TABLET BY MOUTH DAILY 30 tablet 0  . Insulin Glargine (LANTUS SOLOSTAR) 100 UNIT/ML Solostar Pen Inject 16 Units into the skin every morning. 15 mL 11  . lidocaine-prilocaine (EMLA) cream Apply to affected area once 30 g 3  . LORazepam (ATIVAN) 1 MG tablet Take 0.5-1 tablets (0.5-1 mg total) by mouth 3 (three) times daily as needed for anxiety. 15 tablet 0  . methocarbamol (ROBAXIN) 500 MG tablet Take 1 tablet (500 mg total) by  mouth every 8 (eight) hours as needed for muscle spasms. 90 tablet 2  . metoCLOPramide (REGLAN) 10 MG tablet Take 1 tablet (10 mg total) by mouth 4 (four) times daily. 120 tablet 3  . metoprolol tartrate (LOPRESSOR) 25 MG tablet Take 0.5 tablets (12.5 mg total) by mouth 2 (two) times daily. 60 tablet 1  . naproxen sodium (ALEVE) 220 MG tablet Take 440 mg by mouth daily as needed (pain).     . Nutritional Supplements (FEEDING SUPPLEMENT, VITAL AF 1.2 CAL,) LIQD Place 1,000 mLs into feeding tube continuous. 5000 mL 12  . ondansetron (ZOFRAN) 8 MG tablet Take 1 tablet (8 mg total) by mouth every 8 (eight) hours as needed (Nausea or vomiting). 30 tablet 1  . oxyCODONE (OXY IR/ROXICODONE) 5 MG immediate release tablet Take 1-2 tablets (5-10 mg total) by mouth every 4 (four) hours as needed for moderate pain, severe pain or breakthrough pain. 60 tablet 0  . Pancrelipase, Lip-Prot-Amyl, (ZENPEP PO) Take 2 capsules by mouth 3 (three) times daily.    . pantoprazole (PROTONIX) 40 MG tablet Take 1 tablet (40 mg total) by mouth daily. 30 tablet 3  . potassium chloride SA (K-DUR,KLOR-CON) 20 MEQ tablet Take 1 tablet (20 mEq total) by mouth daily. 30 tablet 1  . prochlorperazine (COMPAZINE) 10 MG tablet Take 1 tablet (10 mg total) by mouth every 8 (eight) hours as needed (Nausea or vomiting). 30 tablet 1  . simethicone (MYLICON) 80 MG chewable tablet Chew 1 tablet (80 mg total) by mouth 3 (three) times daily. 30 tablet 0  . thiamine 100 MG tablet Take 1 tablet (100 mg total) by mouth daily. 30 tablet 0  . mirtazapine (REMERON) 15 MG tablet Take 1 tablet (15 mg total) by mouth at bedtime. 30 tablet 1   No current facility-administered medications for this visit.     REVIEW OF SYSTEMS:   Constitutional: Denies fevers, chills or abnormal night sweats (+) fatigue (+) Poor appetite Eyes: Denies blurriness of vision, double vision or watery eyes Ears, nose, mouth, throat, and face: Denies mucositis or sore  throat Respiratory: Denies cough, dyspnea or wheezes Cardiovascular: Denies palpitation, chest discomfort or lower extremity swelling Gastrointestinal:  Denies nausea, heartburn or change in bowel habits Skin: Denies abnormal skin rashes Lymphatics: Denies new lymphadenopathy or easy bruising Neurological:Denies numbness, tingling or new weaknesses Behavioral/Psych: Mood is stable, no  new changes  All other systems were reviewed with the patient and are negative.  PHYSICAL EXAMINATION: ECOG PERFORMANCE STATUS: 1 - Symptomatic but completely ambulatory Vitals:   09/19/16 1510  BP: 120/76  Pulse: 96  Resp: 18  Temp: 98.5 F (36.9 C)  TempSrc: Oral  SpO2: 100%  Weight: 165 lb 12.8 oz (75.2 kg)  Height: 5\' 11"  (1.803 m)   GENERAL:alert, no distress and comfortable. In treatment chair. SKIN: skin color, texture, turgor are normal, no rashes or significant lesions EYES: normal, conjunctiva are pink and non-injected, sclera clear OROPHARYNX:no exudate, no erythema and lips, buccal mucosa, and tongue normal  NECK: supple, thyroid normal size, non-tender, without nodularity LYMPH:  no palpable lymphadenopathy in the cervical, axillary or inguinal LUNGS: clear to auscultation and percussion with normal breathing effort HEART: regular rate & rhythm and no murmurs and no lower extremity edema ABDOMEN: (+) Feeding tube in the left lower abdomen. Musculoskeletal:no cyanosis of digits and no clubbing  Extremities: (+) Picc line for TPN on the right side. PSYCH: alert & oriented x 3 with fluent speech NEURO: no focal motor/sensory deficits  LABORATORY DATA:  I have reviewed the data as listed CBC Latest Ref Rng & Units 09/05/2016 09/04/2016 09/01/2016  WBC 4.0 - 10.5 K/uL 7.2 7.7 9.9  Hemoglobin 13.0 - 17.0 g/dL 8.9(L) 8.7(L) 9.6(L)  Hematocrit 39.0 - 52.0 % 27.3(L) 27.0(L) 30.3(L)  Platelets 150 - 400 K/uL 370 365 380   CMP Latest Ref Rng & Units 09/05/2016 09/04/2016 09/03/2016  Glucose 65  - 99 mg/dL 182(H) 142(H) 136(H)  BUN 6 - 20 mg/dL 10 13 15   Creatinine 0.61 - 1.24 mg/dL 0.58(L) 0.69 0.69  Sodium 135 - 145 mmol/L 134(L) 133(L) 134(L)  Potassium 3.5 - 5.1 mmol/L 4.1 4.2 4.3  Chloride 101 - 111 mmol/L 101 99(L) 99(L)  CO2 22 - 32 mmol/L 29 29 29   Calcium 8.9 - 10.3 mg/dL 8.4(L) 8.2(L) 8.3(L)  Total Protein 6.5 - 8.1 g/dL 5.7(L) 5.2(L) -  Total Bilirubin 0.3 - 1.2 mg/dL 0.2(L) 0.4 -  Alkaline Phos 38 - 126 U/L 73 72 -  AST 15 - 41 U/L 18 13(L) -  ALT 17 - 63 U/L 19 14(L) -   PATHOLOGY REPORT  Diagnosis 08/21/16 1. Liver, biopsy - BENIGN VASCULAR PROLIFERATION. - NO MALIGNANCY IDENTIFIED. 2. Whipple procedure/resection - AMPULLARY INVASIVE ADENOCARCINOMA, MODERATELY DIFFERENTIATED, SPANNING 2.8 CM. - TUMOR INVADES PERIPANCREATIC TISSUE AND PANCREAS >0.5 CM. - RESECTION MARGINS ARE NEGATIVE. - METASTATIC CARCINOMA IN ONE OF SIXTEEN LYMPH NODES (1/16). - CHRONIC GASTRITIS. - CHRONIC CHOLECYSTITIS. - SEE ONCOLOGY TABLE. 3. Lymph node, biopsy, portal - ONE OF ONE LYMPH NODE NEGATIVE FOR CARCINOMA (0/1). - LIPOGRANULOMATOUS CHANGE. 4. Lymph node, biopsy, common hepatic - ONE OF ONE LYMPH NODE NEGATIVE FOR CARCINOMA (0/1). - LIPOGRANULOMATOUS CHANGE. 5. Lymph node, biopsy, Peripancreatic - ONE OF ONE LYMPH NODE NEGATIVE FOR CARCINOMA (0/1). Microscopic Comment 1. There is a collection of large bland vessels and this is favored to represent a hemangioma. 2. AMPULLA OF VATER: Specimen: Pancreaticoduodenectomy. Other organs received: None. Procedure: Whipple resection with lymph node biopsies. Tumor Site: Ampulla of Vater, can not further specify. Tumor Size: 2.8 cm. Histologic Type: Adenocarcinoma. Histologic Grade: Moderately differentiated, G2 Microscopic Tumor Extension: Involves peripancreatic tissue and invades pancreas more than 0.5 cm. 1 of 3 FINAL for RONIK, MACGILLIVRAY [id number]) Microscopic Comment(continued) Margins: Negative. Pancreaticoduodenal  Resection Specimen Proximal Margin: Negative. Distal Margin: Negative. Pancreatic Retroperitoneal (Uncinate) Margin: Negative. Bile Duct Margin: Negative. Distal Pancreatic  Resection Margin: Negative. Closest margin and distance to margin (if all margins negative): 0.3 cm (blue inked superior mesenteric vein margin). Lymph-Vascular Invasion: Not identified. Perineural Invasion: Present. Additional Pathologic: None. Ancillary Studies: Can be performed upon request. Lymph nodes: number examined 1; number positive: 16 Pathologic Staging: ypT3b, ypN1 Comment(s): There is one lymph node containing tumor that is directly adjacent to the main mass, however, tumor is within the lymphatics of the lymph node and is therefore considered a metastasis for staging purposes. Vicente Males MD Pathologist, Electronic Signature (Case signed 08/22/2016) Intraoperative Diagnosis 1. FROZEN SECTION DIAGNOSIS, LIVER BIOPSY: BENIGN. ([facility]) 2. FROZEN SECTION DIAGNOSIS, BILE DUCT MARGIN: NEGATIVE FOR TUMOR. PANCREATIC MARGIN: FIBROSIS, INFLAMMATION, AND ATROPHY. NO DIAGNOSTIC TUMOR. (B[facility])  Diagnosis 03/20/2016 FINE NEEDLE ASPIRATION, ENDOSCOPIC, PANCREAS HEAD (SPECIMEN 1 OF 1 COLLECTED 03/20/16): MALIGNANT CELLS CONSISTENT WITH ADENOCARCINOMA. Preliminary Diagnosis Intraoperative Diagnosis: Adequate. ([facility])  Diagnosis 03/13/2016 Duodenum, Biopsy, Duodenal mass - BENIGN ULCERATED AND INFLAMED SMALL BOWEL-TYPE MUCOSA. - THERE IS NO EVIDENCE OF MALIGNANCY. - SEE COMMENT. Microscopic Comment Multiple tissue levels have been examined. Dr. Claudette Laws has reviewed the case and concurs with this interpretation. Dr. Benson Norway was paged on 03/14/2016. ([facility]:kh 03/14/16)  RADIOGRAPHIC STUDIES: I have personally reviewed the radiological images as listed and agreed with the findings in the report. Ct Abdomen Pelvis W Contrast  Result Date: 08/31/2016 CLINICAL DATA:  Pain and nausea after Whipple procedure.  Hiccups. EXAM: CT  ABDOMEN AND PELVIS WITH CONTRAST TECHNIQUE: Multidetector CT imaging of the abdomen and pelvis was performed using the standard protocol following bolus administration of intravenous contrast. CONTRAST:  43mL ISOVUE-300 IOPAMIDOL (ISOVUE-300) INJECTION 61%, 1 ISOVUE-300 IOPAMIDOL (ISOVUE-300) INJECTION 61% COMPARISON:  06/05/2016 preoperative CT FINDINGS: Lower chest: Right basilar atelectasis. No effusion or pneumothorax. The visualized cardiac chambers are normal in size with coronary arteriosclerosis. No pericardial effusion. Hepatobiliary: Cholecystectomy with gas likely lucencies in the operative bed more often consistent with postoperative change. Pneumobilia consistent with recent Whipple procedure. Pancreas: Resected pancreatic head consistent with Whipple procedure with pancreatic duct stent is seen within the pancreatic duct extending into the anastomosed jejunum. Spleen: Intact without resection.  No splenomegaly. Adrenals/Urinary Tract: Normal bilateral adrenal glands. 4 mm right upper pole renal cyst too small to further characterize. No obstructive uropathy or nephrolithiasis on either side. Urinary bladder is physiologically distended. Stomach/Bowel: Resected gastric antrum and portions of the duodenum as part of a Whipple procedure. Moderate-to-marked gastric distention with fluid and food. No dehiscence identified at the hepaticojejunostomy, pancreaticojejunostomy nor gastro jejunostomy. Moderate fluid-filled jejunum which may represent a postoperative ileus. Distal small bowel appears contracted. No definite transition. Vascular/Lymphatic: Aortoiliac atherosclerosis. No aneurysm. No lymphadenopathy. Reproductive: Normal size prostate. Other: Two drains are identified, the more lateral adjacent to the choledochal jejunostomy site and the more medial in tibia and superior to the pancreaticojejunostomy. Left-sided feeding tube is seen within small bowel. Pancreatic duct stent is noted extending into  the anastomosed jejunum. Musculoskeletal: Nonacute IMPRESSION: 1. Status post classic appearing Whipple procedure by dehiscence of suture sites. Marked gastric distention with fluid and food may reflect delayed gastric emptying. Fluid-filled jejunum has the appearance of postoperative ileus. No definite transition site is identified. 2. No sterile fluid collection or abscess. There are mottled gas like lucencies in the gallbladder fossa which may represent postop change. Electronically Signed   By: Ashley Royalty M.D.   On: 08/31/2016 19:21   Dg Abd Acute W/chest  Result Date: 09/05/2016 CLINICAL DATA:  LLQ pain that started after 30 minutes taking  medications by peg tube rtoday. Pt reports he has not had a good BM in 3 weeks. Pt also reports he was discharged yesterday from hospital after getting an enema.; diabetic; HTN; hx pancreatic CA; ex smoker; EXAM: DG ABDOMEN ACUTE W/ 1V CHEST COMPARISON:  CT, 08/31/2016 FINDINGS: There is no bowel dilation to suggest obstruction or significant generalized adynamic ileus. There are scattered nonspecific air-fluid levels. There is no free air. Several surgical vascular clips are noted in the right upper quadrant. There is a all small bowel catheter projecting in the lower abdomen, stable from the prior CT. Soft tissues are otherwise unremarkable. Normal heart, mediastinum and hila.  The lungs are clear. Right PICC tip projects in the lower superior vena cava. Left Port-A-Cath catheter tip projects in the mid to lower superior vena cava. No acute skeletal abnormality. IMPRESSION: 1. No evidence of bowel obstruction, significant generalized adynamic ileus or free air. 2. Few nonspecific scattered air-fluid levels. 3. No active cardiopulmonary disease. Electronically Signed   By: Lajean Manes M.D.   On: 09/05/2016 12:07   Dg Abd Portable 1v  Result Date: 08/21/2016 CLINICAL DATA:  61 y/o  M; intraoperative needle miscount. EXAM: PORTABLE ABDOMEN - 1 VIEW COMPARISON:   None. FINDINGS: Multiple drains, skin staples, and surgical clips are present. No needle is identified. Postoperative pneumoperitoneum. Unremarkable bowel gas pattern. No acute osseous abnormality identified. IMPRESSION: Multiple drains, skin staples, and surgical clips are present. No needle is identified. These results were called by telephone at the time of interpretation on 08/21/2016 at 2:20 pm to Dr. Stark Klein , who verbally acknowledged these results. Electronically Signed   By: Kristine Garbe M.D.   On: 08/21/2016 14:20   EUS Dr. Benson Norway  Endosonographic Finding 03/20/2016 Findings: An irregular mass was identified in the pancreatic head. The mass was hypoechoic. The mass measured 39 mm by 32 mm in maximal cross-sectional diameter. The outer margins were irregular. An intact interface was seen between the mass and the celiac trunk and portal vein suggesting a lack of invasion. Fine needle aspiration for cytology was performed. Color Doppler imaging was utilized prior to needle puncture to confirm a lack of significant vascular structures within the needle path. Five passes were made with the 25 gauge needle using a transduodenal approach. A stylet was used. A cytotechnologist was present to evaluate the adequacy of the specimen. The cellularity of the specimen was adequate. Final cytology results are pending. - A mass was identified in the pancreatic head. Tissue was obtained from this exam, and results are pending. However, the endosonographic appearance is highly suspicious for adenocarcinoma. Fine needle aspiration performed.  ERCP 03/13/2016 Impression: - One covered metal stent was placed into the common bile duct.  CT chest, abdomen, pelvis w contrast 06/05/2016 IMPRESSION: Mildly decreased size of mass within pancreatic head and uncinate process, with common bile duct stent in appropriate position.  No evidence of metastatic disease or other acute findings within  the chest, abdomen, or pelvis.  ASSESSMENT & PLAN: 61 y.o. African-American male, with past medical history of diabetes and hypertension, presented with epigastric pain, weight loss, and jaundice.  1. 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 -The patient underwent surgery with Dr. Barry Dienes on 08/21/2016 with Whipple procedure/resection of the pancreas, liver biopsy, and lymph node biopsy. Liver biopsy was benign. Whipple procedure revealed moderately differentiated ampullary invasive adenocarcinoma measuring 2.8 cm. The tumor invaded the  peripancreatic tissue and the pancreas > 0.5 cm. The resection margins were negative. 1/19 biopsied lymph nodes were positive. -We discussed the patient's pathology and high risk of recurrence after surgery disease locally advanced disease even after neoadjuvant therapy.  -I recommend adjuvant chemo with gemcitabine and Xeloda, to reduce his risk of recurrence. I will likely give him 4 more months. The benefit and risks were discussed with him. -We discussed the role of adjuvant radiation, and possible chemoradiation and I will get input from Dr. Lisbeth Renshaw. -He has been recovering from surgery slowly, especially with depression and malnutrition. He is still on TPN, which will be high risk for infection when he is on chemotherapy. -I strongly encouraged him to follow up with dietitian, and increase by mouth intake, hopefully we can take him off TPN in 2-3 weeks.  2. HTN and DM - he will continue medication and follow up with his primary care physician  -We reviewed that his blood pressure and blood glucose will need to be monitored closely during the chemotherapy, and his medication may need to be adjusted  -His hypoglycemia has been better controlled lately. I again discussed diabetic diet, and I strongly encouraged him to  exercise  3. Malnutrition -He has lost significant amount weight, albumin was low, he was seen by our dietitian Pamala Hurry and will follow up  -We'll watch his weight and nutrition status closely when he is on chemotherapy -He is on TPN and has a PEG tube. The patient states he is not using his PEG tube and is not taking Glucerna due to fear of spiking his sugar. Only PO intake is soup.  4. Depression -The patient is depressed about his diagnosis. -We discussed the patient speaking with social work and the chaplain for which he has their numbers. -I recommend him to try mirtazapine for his depression and anorexia. I'll start him on low-dose 15 mg at bedtime  Plan -He is scheduled to see Dr. Barry Dienes on 09/24/16 for a follow up. -I will contact Dr. Lisbeth Renshaw to see if he needs adjuvant radiation or adjuvant chemoradiation.  -He will start mirtazapine for depression, insomnia and anorexia. I also encouraged him to follow-up with dietitian and our social worker. -Labs, flush, and f/u in 2 weeks to finalize his adjuvant therapy   All questions were answered. The patient knows to call the clinic with any problems, questions or concerns.  I spent 25 minutes counseling the patient face to face. The total time spent in the appointment was 30 minutes and more than 50% was on counseling.     Truitt Merle, MD 09/19/2016  This document serves as a record of services personally performed by Truitt Merle, MD. It was created on her behalf by Darcus Austin, a trained medical scribe. The creation of this record is based on the scribe's personal observations and the provider's statements to them. This document has been checked and approved by the attending provider.

## 2016-09-19 ENCOUNTER — Telehealth: Payer: Self-pay | Admitting: Hematology

## 2016-09-19 ENCOUNTER — Ambulatory Visit (HOSPITAL_BASED_OUTPATIENT_CLINIC_OR_DEPARTMENT_OTHER): Payer: BLUE CROSS/BLUE SHIELD | Admitting: Hematology

## 2016-09-19 VITALS — BP 120/76 | HR 96 | Temp 98.5°F | Resp 18 | Ht 71.0 in | Wt 165.8 lb

## 2016-09-19 DIAGNOSIS — C250 Malignant neoplasm of head of pancreas: Secondary | ICD-10-CM

## 2016-09-19 DIAGNOSIS — E46 Unspecified protein-calorie malnutrition: Secondary | ICD-10-CM

## 2016-09-19 DIAGNOSIS — C259 Malignant neoplasm of pancreas, unspecified: Secondary | ICD-10-CM

## 2016-09-19 MED ORDER — MIRTAZAPINE 15 MG PO TABS: 15.0000 mg | ORAL_TABLET | Freq: Every day | ORAL | 1 refills | Status: AC

## 2016-09-19 NOTE — Telephone Encounter (Signed)
Appointments scheduled per 2/9 LOS. Patient given AVS report and calendars with future scheduled appointments.  °

## 2016-09-20 ENCOUNTER — Encounter: Payer: Self-pay | Admitting: Hematology

## 2016-09-22 ENCOUNTER — Telehealth: Payer: Self-pay | Admitting: Nutrition

## 2016-09-22 NOTE — Telephone Encounter (Signed)
I received a call from patient reporting he continues on parenteral nutrition. Reports he does not tolerate the tube feeding via jejunostomy tube. Reports he is tolerating small amounts of chicken noodle soup, mashed potatoes and Glucerna. Patient has many questions about what he can eat. Provided education over the telephone regarding ways for patient to increase oral intake. I have mail patient fact sheets. I have scheduled a follow-up with patient on Friday February 23rd.

## 2016-09-26 ENCOUNTER — Ambulatory Visit: Payer: BLUE CROSS/BLUE SHIELD | Admitting: Hematology

## 2016-09-28 ENCOUNTER — Telehealth: Payer: Self-pay | Admitting: General Surgery

## 2016-09-28 NOTE — Telephone Encounter (Addendum)
Pt having pain at picc site.  Denies redness, fever/chills.   Advised to loop the tubing twice and tape it down.    Also sent order for cycling TNA. This has not happened yet.  Given number for [facility] [phone].    Calling to inquire about process of cycling.  Discussed with pharmacist to move to 2/3 nutrition at 14 hours per day.  She is giving to the [facility] pharmacist to correct tomorrow.

## 2016-10-02 NOTE — Progress Notes (Signed)
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 Question loss of fat plane between the posterior aspect of mass and IVC. Mildly dilated CBD 10 mm.      03/13/2016 Procedure    ERCP and medical stem placement in CBD      03/20/2016 Initial Diagnosis    Primary pancreatic adenocarcinoma ([place])      03/20/2016 Initial Biopsy    FNA of the pancreatic head mass from EUS showed a malignant cells consistent with adenocarcinoma      03/20/2016 Procedure    EUS showed an irregular mass in the pancreatic head, measuring 3.9 x 3.2 cm, no invading of the celiac trunk or portal vein. The mass was biopsied.       04/09/2016 -  Neo-Adjuvant Chemotherapy    Gemcitabine and Abraxane on Day 1, 8 every 21 days       06/05/2016 Imaging    CT CAP w Contrast 06/05/16 IMPRESSION: Mildly decreased size of mass within pancreatic head and uncinateprocess, with common bile duct stent in appropriate position. No evidence of metastatic disease or other acute findings within thechest, abdomen, or  pelvis.      08/21/2016 Surgery    Whipple surgery by Dr. Barry Dienes       08/21/2016 Pathology Results    Whipple surgery showed ampullary invasive adenocarcinoma, moderately differentiated, 2.8 cm, tumor invades peripancreatic tissue and pancreas more than 0.5 cm, resection margins are negative, metastatic carcinoma in 1 of 16 lymph nodes. Biopsy of the liver and portal lymph nodes are negative.       08/31/2016 Imaging    CT CAP w Contrast 08/31/16 IMPRESSION: 1. Status post classic appearing Whipple procedure by dehiscence of suture sites. Marked gastric distention with fluid and food may reflect delayed gastric emptying. Fluid-filled jejunum has the appearance of postoperative ileus. No definite transition site is identified. 2. No sterile fluid collection or abscess. There are mottled gas like lucencies in the gallbladder fossa which may represent postop change.        HISTORY OF PRESENTING ILLNESS:  Craig [name] 61 y.o. male is here because of His newly diagnosed pancreatic cancer. He is coming up by his wife to our multidisciplinary check clinic today.  He has had epigastric pain for the last 4-6 weeks. He describes the pain is intermittent, last a few hours and a few times a day, located in the low mid chest and upper abdomen, not related to eating or position. He denies nausea, cough, or dyspnea. His appetite has decreased lately, and he complains about moderate fatigue, he was  not able to tolerate his physically demanding job, and he has been out of week for 3 weeks. He lost about 30-40 lbs in the past 2 months.   He was seen by his primary care physician a few times, and he developed jaundice, and abnormal liver function. He was sent to Hospital by his primary care physician. During his hospital stay, CT scan reviewed a pancreatic head mass, and mild dilatation of bile duct. He was seen by GI Dr. Jacelyn Grip, underwent ERCP and metal stent placement in CBD. He subsequently underwent EUS  which showed an irregular mass in the pancreatic head, measuring 3.9 cm, no invading of the celiac trunk or portal vein. The mass was biopsied, which showed adenocarcinoma.  He is married, lives with his wife. His abdominal pain, fatigue has not changed much since the CBD stent placement. He is able to tolerate routine activities, no other new complaints. He has been drinking beer moderately for the past 40 years, but stopped a few weeks ago.   CURRENT THERAPY: Pending adjuvant chemotherapy with Gemcitabine and Xeloda  INTERIM HISTORY: [name] returns for follow-up and treatment. He has been doing well. He hasn't been eating much better, but still gained some weight. He is eating about a can of soup a day, and that's it. He has intravenous nutrients all day, which he says fills him up. He does not want any medication to help his appetite. He is still very tired and depressed. Pt saw Dr. Barry Dienes last month, and had his feeding tube removed. Denies any other concerns.   MEDICAL HISTORY:  Past Medical History:  Diagnosis Date  . Diabetes mellitus without complication ([place])   . ETOH abuse   . Hypertension    off bp meds 3 4- months ago  . Primary pancreatic adenocarcinoma ([place]) 03/28/2016  . Type 2 diabetes mellitus ([place])     SURGICAL HISTORY: Past Surgical History:  Procedure Laterality Date  . ERCP Left 03/13/2016   Procedure: ENDOSCOPIC RETROGRADE CHOLANGIOPANCREATOGRAPHY (ERCP);  Surgeon: Carol Ada, MD;  Location: [facility] ENDOSCOPY;  Service: Endoscopy;  Laterality: Left;  . EUS N/A 03/20/2016   Procedure: UPPER ENDOSCOPIC ULTRASOUND (EUS) LINEAR;  Surgeon: Carol Ada, MD;  Location: [facility] ENDOSCOPY;  Service: Endoscopy;  Laterality: N/A;  . HERNIA REPAIR    . KNEE ARTHROSCOPY     left and right  . LAPAROSCOPY N/A 08/21/2016   Procedure: LAPAROSCOPY DIAGNOSTIC;  Surgeon: Stark Klein, MD;  Location: [facility] ORS;  Service: General;  Laterality: N/A;  . PORTACATH PLACEMENT N/A 04/07/2016    Procedure: INSERTION PORT-A-CATH;  Surgeon: Stark Klein, MD;  Location: [place];  Service: General;  Laterality: N/A;  . WHIPPLE PROCEDURE N/A 08/21/2016   Procedure: WHIPPLE PROCEDURE;  Surgeon: Stark Klein, MD;  Location: [facility] ORS;  Service: General;  Laterality: N/A;    SOCIAL HISTORY: Social History   Social History  . Marital status: Married    Spouse name: N/A  . Number of children: N/A  . Years of education: N/A   Occupational History  . Not on file.   Social History Main Topics  . Smoking status: Former Smoker    Packs/day: 1.00    Years: 15.00    Quit date: 08/12/1995  . Smokeless tobacco: Never Used  . Alcohol use Yes     Comment: he used to drink 6 pack beer a day for 40 years, quit in 02/2016   . Drug use: No  . Sexual activity: Not on file   Other  Topics Concern  . Not on file   Social History Narrative  . No narrative on file    FAMILY HISTORY: Family History  Problem Relation Age of Onset  . Heart attack Mother     ALLERGIES:  is allergic to no known allergies.  MEDICATIONS:  Current Outpatient Prescriptions  Medication Sig Dispense Refill  . bisacodyl (DULCOLAX) 10 MG suppository Place 1 suppository (10 mg total) rectally daily as needed for moderate constipation. 12 suppository 0  . folic acid (FOLVITE) 1 MG tablet TAKE ONE TABLET BY MOUTH DAILY 30 tablet 0  . Insulin Glargine (LANTUS SOLOSTAR) 100 UNIT/ML Solostar Pen Inject 16 Units into the skin every morning. 15 mL 11  . lidocaine-prilocaine (EMLA) cream Apply to affected area once 30 g 3  . LORazepam (ATIVAN) 1 MG tablet Take 0.5-1 tablets (0.5-1 mg total) by mouth 3 (three) times daily as needed for anxiety. 15 tablet 0  . methocarbamol (ROBAXIN) 500 MG tablet Take 1 tablet (500 mg total) by mouth every 8 (eight) hours as needed for muscle spasms. 90 tablet 2  . metoCLOPramide (REGLAN) 10 MG tablet Take 1 tablet (10 mg total) by mouth 4 (four) times daily. 120 tablet 3  . metoprolol tartrate  (LOPRESSOR) 25 MG tablet Take 0.5 tablets (12.5 mg total) by mouth 2 (two) times daily. 60 tablet 1  . mirtazapine (REMERON) 15 MG tablet Take 1 tablet (15 mg total) by mouth at bedtime. 30 tablet 1  . naproxen sodium (ALEVE) 220 MG tablet Take 440 mg by mouth daily as needed (pain).     . Nutritional Supplements (FEEDING SUPPLEMENT, VITAL AF 1.2 CAL,) LIQD Place 1,000 mLs into feeding tube continuous. 5000 mL 12  . ondansetron (ZOFRAN) 8 MG tablet Take 1 tablet (8 mg total) by mouth every 8 (eight) hours as needed (Nausea or vomiting). 30 tablet 1  . oxyCODONE (OXY IR/ROXICODONE) 5 MG immediate release tablet Take 1-2 tablets (5-10 mg total) by mouth every 4 (four) hours as needed for moderate pain, severe pain or breakthrough pain. 60 tablet 0  . Pancrelipase, Lip-Prot-Amyl, (ZENPEP PO) Take 2 capsules by mouth 3 (three) times daily.    . pantoprazole (PROTONIX) 40 MG tablet Take 1 tablet (40 mg total) by mouth daily. 30 tablet 3  . potassium chloride SA (K-DUR,KLOR-CON) 20 MEQ tablet Take 1 tablet (20 mEq total) by mouth daily. 30 tablet 1  . prochlorperazine (COMPAZINE) 10 MG tablet Take 1 tablet (10 mg total) by mouth every 8 (eight) hours as needed (Nausea or vomiting). 30 tablet 1  . simethicone (MYLICON) 80 MG chewable tablet Chew 1 tablet (80 mg total) by mouth 3 (three) times daily. 30 tablet 0  . thiamine 100 MG tablet Take 1 tablet (100 mg total) by mouth daily. 30 tablet 0   No current facility-administered medications for this visit.     REVIEW OF SYSTEMS:   Constitutional: Denies fevers, chills or abnormal night sweats (+) fatigue (+) Poor appetite Eyes: Denies blurriness of vision, double vision or watery eyes Ears, nose, mouth, throat, and face: Denies mucositis or sore throat Respiratory: Denies cough, dyspnea or wheezes Cardiovascular: Denies palpitation, chest discomfort or lower extremity swelling Gastrointestinal:  Denies nausea, heartburn or change in bowel habits Skin:  Denies abnormal skin rashes Lymphatics: Denies new lymphadenopathy or easy bruising Neurological:Denies numbness, tingling or new weaknesses Behavioral/Psych: Mood is stable, no new changes  All other systems were reviewed with the patient and are negative.  PHYSICAL EXAMINATION: ECOG PERFORMANCE  STATUS: 1 - Symptomatic but completely ambulatory   Vitals:   10/03/16 1152  BP: (!) 141/71  Pulse: 92  Resp: 18  Temp: 98.3 F (36.8 C)  TempSrc: Oral  SpO2: 100%  Weight: 170 lb 3.2 oz (77.2 kg)  Height: 5\' 11"  (1.803 m)    GENERAL:alert, no distress and comfortable. In treatment chair. SKIN: skin color, texture, turgor are normal, no rashes or significant lesions EYES: normal, conjunctiva are pink and non-injected, sclera clear OROPHARYNX:no exudate, no erythema and lips, buccal mucosa, and tongue normal  NECK: supple, thyroid normal size, non-tender, without nodularity LYMPH:  no palpable lymphadenopathy in the cervical, axillary or inguinal LUNGS: clear to auscultation and percussion with normal breathing effort HEART: regular rate & rhythm and no murmurs and no lower extremity edema ABDOMEN: (+) Feeding tube in the left lower abdomen. Musculoskeletal:no cyanosis of digits and no clubbing  Extremities: (+) Picc line for TPN on the right side. PSYCH: alert & oriented x 3 with fluent speech NEURO: no focal motor/sensory deficits  LABORATORY DATA:  I have reviewed the data as listed CBC Latest Ref Rng & Units 10/03/2016 09/05/2016 09/04/2016  WBC 4.0 - 10.3 10e3/uL 4.6 7.2 7.7  Hemoglobin 13.0 - 17.1 g/dL 10.2(L) 8.9(L) 8.7(L)  Hematocrit 38.4 - 49.9 % 31.8(L) 27.3(L) 27.0(L)  Platelets 140 - 400 10e3/uL 226 370 365   CMP Latest Ref Rng & Units 10/03/2016 09/05/2016 09/04/2016  Glucose 70 - 140 mg/dl 231(H) 182(H) 142(H)  BUN 7.0 - 26.0 mg/dL 10.6 10 13   Creatinine 0.7 - 1.3 mg/dL 0.8 0.58(L) 0.69  Sodium 136 - 145 mEq/L 136 134(L) 133(L)  Potassium 3.5 - 5.1 mEq/L 4.3 4.1 4.2    Chloride 101 - 111 mmol/L - 101 99(L)  CO2 22 - 29 mEq/L 26 29 29   Calcium 8.4 - 10.4 mg/dL 9.4 8.4(L) 8.2(L)  Total Protein 6.4 - 8.3 g/dL 6.4 5.7(L) 5.2(L)  Total Bilirubin 0.20 - 1.20 mg/dL 0.32 0.2(L) 0.4  Alkaline Phos 40 - 150 U/L 85 73 72  AST 5 - 34 U/L 15 18 13(L)  ALT 0 - 55 U/L 29 19 14(L)   PATHOLOGY REPORT   Diagnosis 08/21/16 1. Liver, biopsy - BENIGN VASCULAR PROLIFERATION. - NO MALIGNANCY IDENTIFIED. 2. Whipple procedure/resection - AMPULLARY INVASIVE ADENOCARCINOMA, MODERATELY DIFFERENTIATED, SPANNING 2.8 CM. - TUMOR INVADES PERIPANCREATIC TISSUE AND PANCREAS >0.5 CM. - RESECTION MARGINS ARE NEGATIVE. - METASTATIC CARCINOMA IN ONE OF SIXTEEN LYMPH NODES (1/16). - CHRONIC GASTRITIS. - CHRONIC CHOLECYSTITIS. - SEE ONCOLOGY TABLE. 3. Lymph node, biopsy, portal - ONE OF ONE LYMPH NODE NEGATIVE FOR CARCINOMA (0/1). - LIPOGRANULOMATOUS CHANGE. 4. Lymph node, biopsy, common hepatic - ONE OF ONE LYMPH NODE NEGATIVE FOR CARCINOMA (0/1). - LIPOGRANULOMATOUS CHANGE. 5. Lymph node, biopsy, Peripancreatic - ONE OF ONE LYMPH NODE NEGATIVE FOR CARCINOMA (0/1). Microscopic Comment 1. There is a collection of large bland vessels and this is favored to represent a hemangioma. 2. AMPULLA OF VATER: Specimen: Pancreaticoduodenectomy. Other organs received: None. Procedure: Whipple resection with lymph node biopsies. Tumor Site: Ampulla of Vater, can not further specify. Tumor Size: 2.8 cm. Histologic Type: Adenocarcinoma. Histologic Grade: Moderately differentiated, G2 Microscopic Tumor Extension: Involves peripancreatic tissue and invades pancreas more than 0.5 cm. 1 of 3 FINAL for TOBEY, CAPELLA [id number]) Microscopic Comment(continued) Margins: Negative. Pancreaticoduodenal Resection Specimen Proximal Margin: Negative. Distal Margin: Negative. Pancreatic Retroperitoneal (Uncinate) Margin: Negative. Bile Duct Margin: Negative. Distal Pancreatic Resection Margin:  Negative. Closest margin and distance to margin (if all margins negative):  0.3 cm (blue inked superior mesenteric vein margin). Lymph-Vascular Invasion: Not identified. Perineural Invasion: Present. Additional Pathologic: None. Ancillary Studies: Can be performed upon request. Lymph nodes: number examined 1; number positive: 16 Pathologic Staging: ypT3b, ypN1 Comment(s): There is one lymph node containing tumor that is directly adjacent to the main mass, however, tumor is within the lymphatics of the lymph node and is therefore considered a metastasis for staging purposes. Vicente Males MD Pathologist, Electronic Signature (Case signed 08/22/2016) Intraoperative Diagnosis 1. FROZEN SECTION DIAGNOSIS, LIVER BIOPSY: BENIGN. ([facility]) 2. FROZEN SECTION DIAGNOSIS, BILE DUCT MARGIN: NEGATIVE FOR TUMOR. PANCREATIC MARGIN: FIBROSIS, INFLAMMATION, AND ATROPHY. NO DIAGNOSTIC TUMOR. (BNS)  Diagnosis 03/20/2016 FINE NEEDLE ASPIRATION, ENDOSCOPIC, PANCREAS HEAD (SPECIMEN 1 OF 1 COLLECTED 03/20/16): MALIGNANT CELLS CONSISTENT WITH ADENOCARCINOMA. Preliminary Diagnosis Intraoperative Diagnosis: Adequate. ([facility])  Diagnosis 03/13/2016 Duodenum, Biopsy, Duodenal mass - BENIGN ULCERATED AND INFLAMED SMALL BOWEL-TYPE MUCOSA. - THERE IS NO EVIDENCE OF MALIGNANCY. - SEE COMMENT. Microscopic Comment Multiple tissue levels have been examined. Dr. Claudette Laws has reviewed the case and concurs with this interpretation. Dr. Benson Norway was paged on 03/14/2016. ([facility]K:kh 03/14/16)  RADIOGRAPHIC STUDIES: I have personally reviewed the radiological images as listed and agreed with the findings in the report. Dg Abd Acute W/chest  Result Date: 09/05/2016 CLINICAL DATA:  LLQ pain that started after 30 minutes taking medications by peg tube rtoday. Pt reports he has not had a good BM in 3 weeks. Pt also reports he was discharged yesterday from hospital after getting an enema.; diabetic; HTN; hx pancreatic CA; ex smoker; EXAM: DG  ABDOMEN ACUTE W/ 1V CHEST COMPARISON:  CT, 08/31/2016 FINDINGS: There is no bowel dilation to suggest obstruction or significant generalized adynamic ileus. There are scattered nonspecific air-fluid levels. There is no free air. Several surgical vascular clips are noted in the right upper quadrant. There is a all small bowel catheter projecting in the lower abdomen, stable from the prior CT. Soft tissues are otherwise unremarkable. Normal heart, mediastinum and hila.  The lungs are clear. Right PICC tip projects in the lower superior vena cava. Left Port-A-Cath catheter tip projects in the mid to lower superior vena cava. No acute skeletal abnormality. IMPRESSION: 1. No evidence of bowel obstruction, significant generalized adynamic ileus or free air. 2. Few nonspecific scattered air-fluid levels. 3. No active cardiopulmonary disease. Electronically Signed   By: Lajean Manes M.D.   On: 09/05/2016 12:07   CT Abdomen Pelvis w/ Contrast 08/31/2016 IMPRESSION: 1. Status post classic appearing Whipple procedure by dehiscence of suture sites. Marked gastric distention with fluid and food may reflect delayed gastric emptying. Fluid-filled jejunum has the appearance of postoperative ileus. No definite transition site is identified. 2. No sterile fluid collection or abscess. There are mottled gas like lucencies in the gallbladder fossa which may represent postop change.  EUS Dr. Benson Norway  Endosonographic Finding 03/20/2016 Findings: An irregular mass was identified in the pancreatic head. The mass was hypoechoic. The mass measured 39 mm by 32 mm in maximal cross-sectional diameter. The outer margins were irregular. An intact interface was seen between the mass and the celiac trunk and portal vein suggesting a lack of invasion. Fine needle aspiration for cytology was performed. Color Doppler imaging was utilized prior to needle puncture to confirm a lack of significant vascular structures within the needle  path. Five passes were made with the 25 gauge needle using a transduodenal approach. A stylet was used. A cytotechnologist was present to evaluate the adequacy of the specimen. The cellularity of  the specimen was adequate. Final cytology results are pending. - A mass was identified in the pancreatic head. Tissue was obtained from this exam, and results are pending. However, the endosonographic appearance is highly suspicious for adenocarcinoma. Fine needle aspiration performed.  ERCP 03/13/2016 Impression: - One covered metal stent was placed into the common bile duct.  CT chest, abdomen, pelvis w contrast 06/05/2016 IMPRESSION: Mildly decreased size of mass within pancreatic head and uncinate process, with common bile duct stent in appropriate position.  No evidence of metastatic disease or other acute findings within the chest, abdomen, or pelvis.  ASSESSMENT & PLAN:  61 y.o. African-American male, with past medical history of diabetes and hypertension, presented with epigastric pain, weight loss, and jaundice.  1. 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 -The patient underwent surgery with Dr. Barry Dienes on 08/21/2016 with Whipple procedure/resection of the pancreas, liver biopsy, and lymph node biopsy. Liver biopsy was benign. Whipple procedure revealed moderately differentiated ampullary invasive adenocarcinoma measuring 2.8 cm. The tumor invaded the peripancreatic tissue and the pancreas > 0.5 cm. The resection margins were negative. 1/19 biopsied lymph nodes were positive. -We previously discussed the patient's pathology and high risk of recurrence after surgery disease locally advanced disease even after neoadjuvant therapy.  -I recommend adjuvant chemo with gemcitabine and Xeloda, to reduce his risk of recurrence. I will likely give him 2  more months (he had neoadjuvant chemo for 4 months). The benefit and risks were discussed with him. He agrees to proceed. Chemotherapy consent was obtained. -The goal of therapy is curative.  -I have discussed with radiation oncologist Dr. Lisbeth Renshaw and he does not recommend adjuvant radiation   -He has been recovering from surgery slowly, especially with depression and malnutrition. He is still on TPN, which will be high risk for infection when he is on chemotherapy. -I strongly encouraged him to follow up with dietitian, and increase by mouth intake, hopefully we can take him off TPN in 2-3 weeks. -His surgery was 6 weeks ago. I suggested starting chemo next week, but he says he is not ready for that yet. He will start the oral Xeloda pill in 2 weeks.  -Restart Gemcitabine in 3 weeks for 2 months.   2. HTN and DM - he will continue medication and follow up with his primary care physician  -We reviewed that his blood pressure and blood glucose will need to be monitored closely during the chemotherapy, and his medication may need to be adjusted  -His hypoglycemia has been better controlled lately. I again discussed diabetic diet, and I strongly encouraged him to exercise -Sugar is 231 today.   3. Malnutrition -He has lost significant amount weight, albumin was low, he was seen by our dietitian Pamala Hurry and will follow up  -We'll watch his weight and nutrition status closely when he is on chemotherapy -He is on TPN and has a PEG tube. The patient states he is not using his PEG tube and is not taking Glucerna due to fear of spiking his sugar. Only PO intake is soup. -He only eats 1 can of soup a day. I have encouraged him to start eating more -He will talk to our nutritionist again.  -I'll discuss with Dr. Barry Dienes, and try to wean him off TPN in the next 2 weeks  4. Depression -The patient is depressed about his diagnosis. -We again discussed the patient speaking with social work  and the chaplain for  which he has their numbers. -He tried mirtazapine, but did not like it and has stopped   Plan -Start oral Xeloda 1500mg  (825mg /m2) bid, 3 weeks on, one week off, in 2 weeks. Depending on how he does on this, start Gemcitabine the week after.  -Labs, flush, and f/u in 2 weeks -will speak Dr. Barry Dienes to see if he can wean off TPN   All questions were answered. The patient knows to call the clinic with any problems, questions or concerns.  I spent 25 minutes counseling the patient face to face. The total time spent in the appointment was 30 minutes and more than 50% was on counseling.  This document serves as a record of services personally performed by Truitt Merle, MD. It was created on her behalf by Martinique Casey, a trained medical scribe. The creation of this record is based on the scribe's personal observations and the provider's statements to them. This document has been checked and approved by the attending provider.  I have reviewed the above documentation for accuracy and completeness and I agree with the above.   Truitt Merle, MD 10/03/2016

## 2016-10-03 ENCOUNTER — Ambulatory Visit: Payer: BLUE CROSS/BLUE SHIELD | Admitting: Nutrition

## 2016-10-03 ENCOUNTER — Ambulatory Visit (HOSPITAL_BASED_OUTPATIENT_CLINIC_OR_DEPARTMENT_OTHER): Payer: BLUE CROSS/BLUE SHIELD | Admitting: Hematology

## 2016-10-03 ENCOUNTER — Ambulatory Visit: Payer: BLUE CROSS/BLUE SHIELD

## 2016-10-03 ENCOUNTER — Other Ambulatory Visit (HOSPITAL_BASED_OUTPATIENT_CLINIC_OR_DEPARTMENT_OTHER): Payer: BLUE CROSS/BLUE SHIELD

## 2016-10-03 ENCOUNTER — Encounter: Payer: Self-pay | Admitting: Hematology

## 2016-10-03 VITALS — BP 141/71 | HR 92 | Temp 98.3°F | Resp 18 | Ht 71.0 in | Wt 170.2 lb

## 2016-10-03 DIAGNOSIS — C779 Secondary and unspecified malignant neoplasm of lymph node, unspecified: Secondary | ICD-10-CM

## 2016-10-03 DIAGNOSIS — F329 Major depressive disorder, single episode, unspecified: Secondary | ICD-10-CM | POA: Diagnosis not present

## 2016-10-03 DIAGNOSIS — C250 Malignant neoplasm of head of pancreas: Secondary | ICD-10-CM

## 2016-10-03 DIAGNOSIS — C259 Malignant neoplasm of pancreas, unspecified: Secondary | ICD-10-CM

## 2016-10-03 DIAGNOSIS — I10 Essential (primary) hypertension: Secondary | ICD-10-CM

## 2016-10-03 DIAGNOSIS — Z95828 Presence of other vascular implants and grafts: Secondary | ICD-10-CM

## 2016-10-03 DIAGNOSIS — E46 Unspecified protein-calorie malnutrition: Secondary | ICD-10-CM

## 2016-10-03 DIAGNOSIS — E119 Type 2 diabetes mellitus without complications: Secondary | ICD-10-CM | POA: Diagnosis not present

## 2016-10-03 LAB — COMPREHENSIVE METABOLIC PANEL
ALBUMIN: 3.3 g/dL — AB (ref 3.5–5.0)
ALT: 29 U/L (ref 0–55)
AST: 15 U/L (ref 5–34)
Alkaline Phosphatase: 85 U/L (ref 40–150)
Anion Gap: 6 mEq/L (ref 3–11)
BILIRUBIN TOTAL: 0.32 mg/dL (ref 0.20–1.20)
BUN: 10.6 mg/dL (ref 7.0–26.0)
CALCIUM: 9.4 mg/dL (ref 8.4–10.4)
CO2: 26 mEq/L (ref 22–29)
CREATININE: 0.8 mg/dL (ref 0.7–1.3)
Chloride: 104 mEq/L (ref 98–109)
EGFR: >90 mL/min/{1.73_m2} (ref >90)
Glucose: 231 mg/dl — ABNORMAL HIGH (ref 70–140)
Potassium: 4.3 mEq/L (ref 3.5–5.1)
Sodium: 136 mEq/L (ref 136–145)
TOTAL PROTEIN: 6.4 g/dL (ref 6.4–8.3)

## 2016-10-03 LAB — CBC WITH DIFFERENTIAL/PLATELET
BASO%: 0.2 % (ref 0.0–2.0)
BASOS ABS: 0 10*3/uL (ref 0.0–0.1)
EOS%: 5 % (ref 0.0–7.0)
Eosinophils Absolute: 0.2 10*3/uL (ref 0.0–0.5)
HEMATOCRIT: 31.8 % — AB (ref 38.4–49.9)
HGB: 10.2 g/dL — ABNORMAL LOW (ref 13.0–17.1)
LYMPH#: 1 10*3/uL (ref 0.9–3.3)
LYMPH%: 21 % (ref 14.0–49.0)
MCH: 25.6 pg — AB (ref 27.2–33.4)
MCHC: 32.1 g/dL (ref 32.0–36.0)
MCV: 79.7 fL (ref 79.3–98.0)
MONO#: 0.4 10*3/uL (ref 0.1–0.9)
MONO%: 8 % (ref 0.0–14.0)
NEUT#: 3 10*3/uL (ref 1.5–6.5)
NEUT%: 65.8 % (ref 39.0–75.0)
PLATELETS: 226 10*3/uL (ref 140–400)
RBC: 3.99 10*6/uL — ABNORMAL LOW (ref 4.20–5.82)
RDW: 16.1 % — ABNORMAL HIGH (ref 11.0–14.6)
WBC: 4.6 10*3/uL (ref 4.0–10.3)

## 2016-10-03 MED ORDER — SODIUM CHLORIDE 0.9 % IJ SOLN
10.0000 mL | INTRAMUSCULAR | Status: DC | PRN
  Administered 2016-10-03: 10 mL via INTRAVENOUS
  Filled 2016-10-03: qty 10

## 2016-10-03 MED ORDER — CAPECITABINE 500 MG PO TABS: 825.0000 mg/m2 | ORAL_TABLET | Freq: Two times a day (BID) | ORAL | 1 refills | Status: DC

## 2016-10-03 MED ORDER — HEPARIN SOD (PORK) LOCK FLUSH 100 UNIT/ML IV SOLN
500.0000 [IU] | Freq: Once | INTRAVENOUS | Status: AC | PRN
  Administered 2016-10-03: 500 [IU] via INTRAVENOUS
  Filled 2016-10-03: qty 5

## 2016-10-03 NOTE — Patient Instructions (Signed)

## 2016-10-03 NOTE — Progress Notes (Signed)
OFF PATHWAY REGIMEN - Pancreatic  No Change  Continue With Treatment as Ordered.   [id number]:FOLFIRINOX (q14d):   A cycle is every 14 days:     Oxaliplatin        Dose Mod: None     Leucovorin        Dose Mod: None     Irinotecan        Dose Mod: None     5-Fluorouracil        Dose Mod: None     5-Fluorouracil        Dose Mod: None  **Always confirm dose/schedule in your pharmacy ordering system**    Patient Characteristics: Adenocarcinoma, Resectable, Neoadjuvant Current evidence of distant metastases? No AJCC T Stage: 2 AJCC N Stage: 0 AJCC Stage Grouping: IB AJCC M Stage: 0 Histology: Adenocarcinoma  Intent of Therapy: Curative Intent, Discussed with Patient

## 2016-10-03 NOTE — Progress Notes (Signed)
DISCONTINUE ON PATHWAY REGIMEN - Pancreatic   [id number]:FOLFIRINOX (q14d):   A cycle is every 14 days:     Oxaliplatin        Dose Mod: None     Leucovorin        Dose Mod: None     Irinotecan        Dose Mod: None     5-Fluorouracil        Dose Mod: None     5-Fluorouracil        Dose Mod: None  **Always confirm dose/schedule in your pharmacy ordering system**    REASON: Other Reason PRIOR TREATMENT: Off Pathway: FOLFIRINOX (q14d) TREATMENT RESPONSE: Partial Response (PR)  START ON PATHWAY REGIMEN - Pancreatic     A cycle is every 28 days:     Gemcitabine        Dose Mod: None     Capecitabine        Dose Mod: None  **Always confirm dose/schedule in your pharmacy ordering system**    Patient Characteristics: Adenocarcinoma, Resectable, Adjuvant, Negative Margins (Includes Positive Lymph Nodes) Current evidence of distant metastases? No AJCC T Stage: 3 AJCC N Stage: 1 AJCC Stage Grouping: IIB AJCC M Stage: 0 Histology: Adenocarcinoma  Intent of Therapy: Curative Intent, Discussed with Patient

## 2016-10-03 NOTE — Progress Notes (Signed)
Nutrition follow-up completed with patient diagnosed with pancreas cancer status post Whipple.  Patient was started on tube feeding via jejunostomy feeding tube after surgery.  However, he did not tolerate enteral feeding.  Patient is currently on TNA but I am not sure how much nutrition this is providing currently. Hospital TNA was as follows:  Clinimix E5/20 (1474mL) over 18h cycle at following rates: ? 50 ml/hr 6p - 7p ? 85 ml/hr 7p - 11a ? 50 ml/hr 11a - 12p then off  20% fat emulsion at 62ml/hr over 12hr. ? This formulation/rates would provide 73gm protein and [place] was to reduce total nutrition provided by TNA and increase oral intake. Feeding tube has been removed. Patient reports he tolerates soup and glucerna. Blood sugars have been eradicate. Weight improved and documented as 170 pounds.  Nutrition Diagnosis: Unintended weight loss improved.  Intervention: Educated patient to consume 3 meals with oral nutrition supplements BID between meals. Reviewed high calorie, high protein fact sheets. Provided samples of oral nutrition supplements. Encouraged him to monitor CBGs and contact MD regarding insulin.  Monitoring,Evaluation, Goals: Patient will increase oral intake so TNA can be weaned and discontinued.  Next Visit:To be scheduled.  **Disclaimer: This note was dictated with voice recognition software. Similar sounding words can inadvertently be transcribed and this note may contain transcription errors which may not have been corrected upon publication of note.**

## 2016-10-04 LAB — CANCER ANTIGEN 19-9: CAN 19-9: 17 U/mL (ref 0–35)

## 2016-10-07 ENCOUNTER — Telehealth: Payer: Self-pay | Admitting: Pharmacist

## 2016-10-07 DIAGNOSIS — C259 Malignant neoplasm of pancreas, unspecified: Secondary | ICD-10-CM

## 2016-10-07 NOTE — Telephone Encounter (Signed)
Oral Chemotherapy Pharmacist Encounter  Received notification from [facility] that patient's Xeloda (capecitabine) prescription would require prior authorization Noted patient to use Xeloda in conjunction with gemcitabine for pancreatic adenocarcinoma, Xeloda to be taken twice daily for 3 weeks on, 1 week off, repeated every 4 weeks  Labs from 10/03/14 reviewed, OK for treatment  Current medication list in Epic assessed, no significant DDIs with Xeloda identified  Prior authorization submitted on CoverMyMeds Key [id number] Status is pending  Oral Oncology Clinic will continue to follow.  Johny Drilling, PharmD, BCPS, BCOP 10/07/2016  2:30 PM Oral Oncology Clinic [phone]

## 2016-10-09 ENCOUNTER — Telehealth: Payer: Self-pay | Admitting: Nutrition

## 2016-10-09 NOTE — Telephone Encounter (Signed)
Called patient to inform him of next scheduled appointment for 3/8 at 12PM. LVM Nutrition appointment scheduled per 2/27 message. Patient notified.

## 2016-10-13 NOTE — Telephone Encounter (Signed)
Oral Chemotherapy Pharmacist Encounter  Received notification from patient's insurance, BCBS provided by [employer], that prior authorization for Xeloda has been DENIED. I called insurance plan at [phone] to inquire about appeal process. They will fax new medication request form and other pertinent documents to the appeal process to me. I will start a letter of medical necessity.  Oral Oncology Clinic will continue to follow.  Johny Drilling, PharmD, BCPS, BCOP 10/13/2016  3:02 PM Oral Oncology Clinic [phone]

## 2016-10-14 ENCOUNTER — Telehealth: Payer: Self-pay | Admitting: Hematology

## 2016-10-14 NOTE — Telephone Encounter (Signed)
Confirmed appointments with patient.

## 2016-10-14 NOTE — Telephone Encounter (Signed)
Oral Chemotherapy Pharmacist Encounter  Appeal packet with MD letter of appeal, [employer] medication request form, last MD office note, excerpt from NCCN guidelines for pancreatic adenocarcinoma, and ESPAC-4 trial faxed to [employer] appeal team at [phone]  [facility] [place] [facility] will continue to follow.  Johny Drilling, PharmD, BCPS, BCOP 10/14/2016  1:06 PM Oral Oncology Clinic [phone]

## 2016-10-15 NOTE — Progress Notes (Signed)
[place]  [phone] [phone]  Clinic Follow Up Note   Patient Care Team: Jilda Panda, MD as PCP - General (Internal Medicine) Tania Ade, RN as Registered Nurse Stark Klein, MD as Consulting Physician (General Surgery) 10/16/2016  CHIEF COMPLAINTS:  Follow up pancreatic cancer   Oncology History   Cancer Staging cT2N1 pancreatic adenocarcinoma of the pancreatic head s/p pancreaticoduodenectomy 08/21/2016 Staging form: Pancreas, AJCC 7th Edition - Clinical stage from 03/20/2016: Stage IB (T2, N0, M0) - Signed by Truitt Merle, MD on 03/28/2016 - Pathologic stage from 08/21/2016: Stage IIB (yT3, N1, cM0) - Signed by Truitt Merle, MD on 09/19/2016       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 Question loss of fat plane between the posterior aspect of mass and IVC. Mildly dilated CBD 10 mm.      03/13/2016 Procedure    ERCP and medical stem placement in CBD      03/20/2016 Initial Diagnosis    Primary pancreatic adenocarcinoma ([place])      03/20/2016 Initial Biopsy    FNA of the pancreatic head mass from EUS showed a malignant cells consistent with adenocarcinoma      03/20/2016 Procedure    EUS showed an irregular mass in the pancreatic head, measuring 3.9 x 3.2 cm, no invading of the celiac trunk or portal vein. The mass was biopsied.       04/09/2016 -  Neo-Adjuvant Chemotherapy    Gemcitabine and Abraxane on Day 1, 8 every 21 days       06/05/2016 Imaging    CT CAP w Contrast 06/05/16 IMPRESSION: Mildly decreased size of mass within pancreatic head and uncinateprocess, with common bile duct stent in appropriate position. No evidence of metastatic disease or other acute findings within thechest, abdomen, or  pelvis.      08/21/2016 Surgery    Whipple surgery by Dr. Barry Dienes       08/21/2016 Pathology Results    Whipple surgery showed ampullary invasive adenocarcinoma, moderately differentiated, 2.8 cm, tumor invades peripancreatic tissue and pancreas more than 0.5 cm, resection margins are negative, metastatic carcinoma in 1 of 16 lymph nodes. Biopsy of the liver and portal lymph nodes are negative.       08/31/2016 Imaging    CT CAP w Contrast 08/31/16 IMPRESSION: 1. Status post classic appearing Whipple procedure by dehiscence of suture sites. Marked gastric distention with fluid and food may reflect delayed gastric emptying. Fluid-filled jejunum has the appearance of postoperative ileus. No definite transition site is identified. 2. No sterile fluid collection or abscess. There are mottled gas like lucencies in the gallbladder fossa which may represent postop change.        HISTORY OF PRESENTING ILLNESS:  Craig [name] 61 y.o. male is here because of His newly diagnosed pancreatic cancer. He is coming up by his wife to our multidisciplinary check clinic today.  He has had epigastric pain for the last 4-6 weeks. He describes the pain is intermittent, last a few hours and a few times a day, located in the low mid chest and upper abdomen, not related to eating or position. He denies nausea, cough, or dyspnea. His appetite has decreased lately, and he complains about moderate fatigue, he was not  able to tolerate his physically demanding job, and he has been out of week for 3 weeks. He lost about 30-40 lbs in the past 2 months.   He was seen by his primary care physician a few times, and he developed jaundice, and abnormal liver function. He was sent to Hospital by his primary care physician. During his hospital stay, CT scan reviewed a pancreatic head mass, and mild dilatation of bile duct. He was seen by GI Dr. Jacelyn Grip, underwent ERCP and metal stent placement in CBD. He subsequently underwent EUS  which showed an irregular mass in the pancreatic head, measuring 3.9 cm, no invading of the celiac trunk or portal vein. The mass was biopsied, which showed adenocarcinoma.  He is married, lives with his wife. His abdominal pain, fatigue has not changed much since the CBD stent placement. He is able to tolerate routine activities, no other new complaints. He has been drinking beer moderately for the past 40 years, but stopped a few weeks ago.   CURRENT THERAPY: Pending adjuvant chemotherapy with Gemcitabine and Xeloda  INTERIM HISTORY: Mr. Kotch returns for follow-up and treatment. He reports feeling well today. His energy level is good. He is not eating much at this time, 2 small meals a day, and he reports he is drinking 2 cans of Ensure a day. He plans to meet with Ernestene Kiel in Nutrition soon.  MEDICAL HISTORY:  Past Medical History:  Diagnosis Date  . Diabetes mellitus without complication ([place])   . ETOH abuse   . Hypertension    off bp meds 3 4- months ago  . Primary pancreatic adenocarcinoma ([place]) 03/28/2016  . Type 2 diabetes mellitus ([place])     SURGICAL HISTORY: Past Surgical History:  Procedure Laterality Date  . ERCP Left 03/13/2016   Procedure: ENDOSCOPIC RETROGRADE CHOLANGIOPANCREATOGRAPHY (ERCP);  Surgeon: Carol Ada, MD;  Location[facility] ENDOSCOPY;  Service: Endoscopy;  Laterality: Left;  . EUS N/A 03/20/2016   Procedure: UPPER ENDOSCOPIC ULTRASOUND (EUS) LINEAR;  Surgeon: Carol Ada, MD;  Location: [facility] ENDOSCOPY;  Service: Endoscopy;  Laterality: N/A;  . HERNIA REPAIR    . KNEE ARTHROSCOPY     left and right  . LAPAROSCOPY N/A 08/21/2016   Procedure: LAPAROSCOPY DIAGNOSTIC;  Surgeon: Stark Klein, MD;  Location: [facility];  Service: General;  Laterality: N/A;  . PORTACATH PLACEMENT N/A 04/07/2016   Procedure: INSERTION PORT-A-CATH;  Surgeon: Stark Klein, MD;  Location: [place];  Service: General;  Laterality: N/A;  . WHIPPLE PROCEDURE N/A 08/21/2016   Procedure: WHIPPLE  PROCEDURE;  Surgeon: Stark Klein, MD;  Location: [facility];  Service: General;  Laterality: N/A;    SOCIAL HISTORY: Social History   Social History  . Marital status: Married    Spouse name: N/A  . Number of children: N/A  . Years of education: N/A   Occupational History  . Not on file.   Social History Main Topics  . Smoking status: Former Smoker    Packs/day: 1.00    Years: 15.00    Quit date: 08/12/1995  . Smokeless tobacco: Never Used  . Alcohol use Yes     Comment: he used to drink 6 pack beer a day for 40 years, quit in 02/2016   . Drug use: No  . Sexual activity: Not on file   Other Topics Concern  . Not on file   Social History Narrative  . No narrative on file    FAMILY HISTORY: Family History  Problem Relation Age of Onset  .  Heart attack Mother     ALLERGIES:  is allergic to no known allergies.  MEDICATIONS:  Current Outpatient Prescriptions  Medication Sig Dispense Refill  . bisacodyl (DULCOLAX) 10 MG suppository Place 1 suppository (10 mg total) rectally daily as needed for moderate constipation. 12 suppository 0  . capecitabine (XELODA) 500 MG tablet Take 3 tablets (1,500 mg total) by mouth 2 (two) times daily after a meal. For 21 days on, 7 days off 696 tablet 1  . folic acid (FOLVITE) 1 MG tablet TAKE ONE TABLET BY MOUTH DAILY 30 tablet 0  . Insulin Glargine (LANTUS SOLOSTAR) 100 UNIT/ML Solostar Pen Inject 16 Units into the skin every morning. 15 mL 11  . lidocaine-prilocaine (EMLA) cream Apply to affected area once 30 g 3  . LORazepam (ATIVAN) 1 MG tablet Take 0.5-1 tablets (0.5-1 mg total) by mouth 3 (three) times daily as needed for anxiety. 15 tablet 0  . methocarbamol (ROBAXIN) 500 MG tablet Take 1 tablet (500 mg total) by mouth every 8 (eight) hours as needed for muscle spasms. 90 tablet 2  . metoCLOPramide (REGLAN) 10 MG tablet Take 1 tablet (10 mg total) by mouth 4 (four) times daily. 120 tablet 3  . metoprolol tartrate (LOPRESSOR) 25 MG tablet  Take 0.5 tablets (12.5 mg total) by mouth 2 (two) times daily. 60 tablet 1  . mirtazapine (REMERON) 15 MG tablet Take 1 tablet (15 mg total) by mouth at bedtime. 30 tablet 1  . naproxen sodium (ALEVE) 220 MG tablet Take 440 mg by mouth daily as needed (pain).     . Nutritional Supplements (FEEDING SUPPLEMENT, VITAL AF 1.2 CAL,) LIQD Place 1,000 mLs into feeding tube continuous. 5000 mL 12  . ondansetron (ZOFRAN) 8 MG tablet Take 1 tablet (8 mg total) by mouth every 8 (eight) hours as needed (Nausea or vomiting). 30 tablet 1  . oxyCODONE (OXY IR/ROXICODONE) 5 MG immediate release tablet Take 1-2 tablets (5-10 mg total) by mouth every 4 (four) hours as needed for moderate pain, severe pain or breakthrough pain. 60 tablet 0  . Pancrelipase, Lip-Prot-Amyl, (ZENPEP PO) Take 2 capsules by mouth 3 (three) times daily.    . pantoprazole (PROTONIX) 40 MG tablet Take 1 tablet (40 mg total) by mouth daily. 30 tablet 3  . potassium chloride SA (K-DUR,KLOR-CON) 20 MEQ tablet Take 1 tablet (20 mEq total) by mouth daily. 30 tablet 1  . prochlorperazine (COMPAZINE) 10 MG tablet Take 1 tablet (10 mg total) by mouth every 8 (eight) hours as needed (Nausea or vomiting). 30 tablet 1  . simethicone (MYLICON) 80 MG chewable tablet Chew 1 tablet (80 mg total) by mouth 3 (three) times daily. 30 tablet 0  . thiamine 100 MG tablet Take 1 tablet (100 mg total) by mouth daily. 30 tablet 0   No current facility-administered medications for this visit.     REVIEW OF SYSTEMS:   Constitutional: Denies fevers, chills or abnormal night sweats (+) fatigue (+) Poor appetite Eyes: Denies blurriness of vision, double vision or watery eyes Ears, nose, mouth, throat, and face: Denies mucositis or sore throat Respiratory: Denies cough, dyspnea or wheezes Cardiovascular: Denies palpitation, chest discomfort or lower extremity swelling Gastrointestinal:  Denies nausea, heartburn or change in bowel habits Skin: Denies abnormal skin  rashes Lymphatics: Denies new lymphadenopathy or easy bruising Neurological:Denies numbness, tingling or new weaknesses Behavioral/Psych: Mood is stable, no new changes  All other systems were reviewed with the patient and are negative.  PHYSICAL EXAMINATION: ECOG PERFORMANCE STATUS:  1 - Symptomatic but completely ambulatory   Vitals:   10/16/16 1130  BP: 122/78  Pulse: 65  Resp: 18  Temp: 98.5 F (36.9 C)  TempSrc: Oral  SpO2: 100%  Weight: 167 lb 3.2 oz (75.8 kg)  Height: 5\' 11"  (1.803 m)    GENERAL:alert, no distress and comfortable. In treatment chair. SKIN: skin color, texture, turgor are normal, no rashes or significant lesions EYES: normal, conjunctiva are pink and non-injected, sclera clear OROPHARYNX:no exudate, no erythema and lips, buccal mucosa, and tongue normal  NECK: supple, thyroid normal size, non-tender, without nodularity LYMPH:  no palpable lymphadenopathy in the cervical, axillary or inguinal LUNGS: clear to auscultation and percussion with normal breathing effort HEART: regular rate & rhythm and no murmurs and no lower extremity edema ABDOMEN: (+) Feeding tube in the left lower abdomen. Musculoskeletal:no cyanosis of digits and no clubbing  Extremities: no edema, PICC line has been removed  PSYCH: alert & oriented x 3 with fluent speech NEURO: no focal motor/sensory deficits  LABORATORY DATA:  I have reviewed the data as listed CBC Latest Ref Rng & Units 10/16/2016 10/03/2016 09/05/2016  WBC 4.0 - 10.3 10e3/uL 5.1 4.6 7.2  Hemoglobin 13.0 - 17.1 g/dL 10.9(L) 10.2(L) 8.9(L)  Hematocrit 38.4 - 49.9 % 33.9(L) 31.8(L) 27.3(L)  Platelets 140 - 400 10e3/uL 194 226 370   CMP Latest Ref Rng & Units 10/16/2016 10/03/2016 09/05/2016  Glucose 70 - 140 mg/dl 88 231(H) 182(H)  BUN 7.0 - 26.0 mg/dL 8.7 10.6 10  Creatinine 0.7 - 1.3 mg/dL 0.8 0.8 0.58(L)  Sodium 136 - 145 mEq/L 141 136 134(L)  Potassium 3.5 - 5.1 mEq/L 4.0 4.3 4.1  Chloride 101 - 111 mmol/L - - 101    CO2 22 - 29 mEq/L 28 26 29   Calcium 8.4 - 10.4 mg/dL 9.7 9.4 8.4(L)  Total Protein 6.4 - 8.3 g/dL 6.6 6.4 5.7(L)  Total Bilirubin 0.20 - 1.20 mg/dL 0.41 0.32 0.2(L)  Alkaline Phos 40 - 150 U/L 93 85 73  AST 5 - 34 U/L 18 15 18   ALT 0 - 55 U/L 23 29 19    PATHOLOGY REPORT   Diagnosis 08/21/16 1. Liver, biopsy - BENIGN VASCULAR PROLIFERATION. - NO MALIGNANCY IDENTIFIED. 2. Whipple procedure/resection - AMPULLARY INVASIVE ADENOCARCINOMA, MODERATELY DIFFERENTIATED, SPANNING 2.8 CM. - TUMOR INVADES PERIPANCREATIC TISSUE AND PANCREAS >0.5 CM. - RESECTION MARGINS ARE NEGATIVE. - METASTATIC CARCINOMA IN ONE OF SIXTEEN LYMPH NODES (1/16). - CHRONIC GASTRITIS. - CHRONIC CHOLECYSTITIS. - SEE ONCOLOGY TABLE. 3. Lymph node, biopsy, portal - ONE OF ONE LYMPH NODE NEGATIVE FOR CARCINOMA (0/1). - LIPOGRANULOMATOUS CHANGE. 4. Lymph node, biopsy, common hepatic - ONE OF ONE LYMPH NODE NEGATIVE FOR CARCINOMA (0/1). - LIPOGRANULOMATOUS CHANGE. 5. Lymph node, biopsy, Peripancreatic - ONE OF ONE LYMPH NODE NEGATIVE FOR CARCINOMA (0/1).  Diagnosis 03/20/2016 FINE NEEDLE ASPIRATION, ENDOSCOPIC, PANCREAS HEAD (SPECIMEN 1 OF 1 COLLECTED 03/20/16): MALIGNANT CELLS CONSISTENT WITH ADENOCARCINOMA. Preliminary Diagnosis Intraoperative Diagnosis: Adequate. ([facility])  Diagnosis 03/13/2016 Duodenum, Biopsy, Duodenal mass - BENIGN ULCERATED AND INFLAMED SMALL BOWEL-TYPE MUCOSA. - THERE IS NO EVIDENCE OF MALIGNANCY. - SEE COMMENT.  RADIOGRAPHIC STUDIES: I have personally reviewed the radiological images as listed and agreed with the findings in the report. No results found.   CT Abdomen Pelvis w/ Contrast 08/31/2016 IMPRESSION: 1. Status post classic appearing Whipple procedure by dehiscence of suture sites. Marked gastric distention with fluid and food may reflect delayed gastric emptying. Fluid-filled jejunum has the appearance of postoperative ileus. No definite transition site is  identified. 2. No  sterile fluid collection or abscess. There are mottled gas like lucencies in the gallbladder fossa which may represent postop change.  EUS Dr. Benson [place]  Endosonographic Finding 03/20/2016 Findings: An irregular mass was identified in the pancreatic head. The mass was hypoechoic. The mass measured 39 mm by 32 mm in maximal cross-sectional diameter. The outer margins were irregular. An intact interface was seen between the mass and the celiac trunk and portal vein suggesting a lack of invasion. Fine needle aspiration for cytology was performed. Color Doppler imaging was utilized prior to needle puncture to confirm a lack of significant vascular structures within the needle path. Five passes were made with the 25 gauge needle using a transduodenal approach. A stylet was used. A cytotechnologist was present to evaluate the adequacy of the specimen. The cellularity of the specimen was adequate. Final cytology results are pending. - A mass was identified in the pancreatic head. Tissue was obtained from this exam, and results are pending. However, the endosonographic appearance is highly suspicious for adenocarcinoma. Fine needle aspiration performed.  ERCP 03/13/2016 Impression: - One covered metal stent was placed into the common bile duct.  CT chest, abdomen, pelvis w contrast 06/05/2016 IMPRESSION: Mildly decreased size of mass within pancreatic head and uncinate process, with common bile duct stent in appropriate position.  No evidence of metastatic disease or other acute findings within the chest, abdomen, or pelvis.  ASSESSMENT & PLAN:  61 y.o. African-American male, with past medical history of diabetes and hypertension, presented with epigastric pain, weight loss, and jaundice.  1. 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 -The patient  underwent surgery with Dr. Barry Dienes on 08/21/2016 with Whipple procedure/resection of the pancreas, liver biopsy, and lymph node biopsy. Liver biopsy was benign. Whipple procedure revealed moderately differentiated ampullary invasive adenocarcinoma measuring 2.8 cm. The tumor invaded the peripancreatic tissue and the pancreas > 0.5 cm. The resection margins were negative. 1/19 biopsied lymph nodes were positive. -We previously discussed the patient's pathology and high risk of recurrence after surgery disease locally advanced disease even after neoadjuvant therapy.  -I recommend adjuvant chemo with gemcitabine and Xeloda, to reduce his risk of recurrence. I will likely give him 2 more months (he had neoadjuvant chemo for 4 months). The benefit and risks were discussed with him. He agrees to proceed. Chemotherapy consent was obtained. -The goal of therapy is curative.  -I have discussed with radiation oncologist Dr. Lisbeth Renshaw and he does not recommend adjuvant radiation   -He has recovered better from surgery, he is off TPN now. I encouraged him to eat and drink more. -Lab reviewed, normal CBC and CMP except a mild anemia. His Xeloda was initially did not up by his insurance, etc. in the process of appeal now. I'll start him on first cycle Xelod and gemcitabine next week  2. HTN and DM - he will continue medication and follow up with his primary care physician  -We reviewed that his blood pressure and blood glucose will need to be monitored closely during the chemotherapy, and his medication may need to be adjusted  -His hypoglycemia has been better controlled lately. I again discussed diabetic diet, and I strongly encouraged him to exercise.  -Sugar is 88 today.   3. Malnutrition -He has previously lost significant amount weight, albumin was previously low, he was seen by our dietitian Pamala Hurry and will follow up. -We'll watch his weight  and nutrition status closely when he is on chemotherapy. -He is on TPN  and has a PEG tube. The patient states he is not using his PEG tube and is not taking Glucerna due to fear of spiking his sugar. -He only eats 2 small meals a day with 2 cans of Ensure. I encouraged him to eat more, or increase his Ensure intake. -He will talk to our nutritionist again.  -he is off TPN now   4. Depression -The patient is depressed about his diagnosis. -We again discussed the patient speaking with social work and the chaplain for which he has their numbers. -He tried mirtazapine, but did not like it and has stopped .  Plan -Patient will start intravenous gemcitabine in one week. He will start Xeloda at same time if it's approved  -Second round Gemcitabine in 2 weeks. -f/u in one week   All questions were answered. The patient knows to call the clinic with any problems, questions or concerns.  I spent 15 minutes counseling the patient face to face. The total time spent in the appointment was 20 minutes and more than 50% was on counseling.  This document serves as a record of services personally performed by Truitt Merle, MD. It was created on her behalf by Maryla Morrow, a trained medical scribe. The creation of this record is based on the scribe's personal observations and the provider's statements to them. This document has been checked and approved by the attending provider.  I have reviewed the above documentation for accuracy and completeness and I agree with the above.   Truitt Merle, MD 10/18/2016

## 2016-10-16 ENCOUNTER — Ambulatory Visit: Payer: BLUE CROSS/BLUE SHIELD | Admitting: Nutrition

## 2016-10-16 ENCOUNTER — Other Ambulatory Visit (HOSPITAL_BASED_OUTPATIENT_CLINIC_OR_DEPARTMENT_OTHER): Payer: BLUE CROSS/BLUE SHIELD

## 2016-10-16 ENCOUNTER — Telehealth: Payer: Self-pay | Admitting: Hematology

## 2016-10-16 ENCOUNTER — Ambulatory Visit (HOSPITAL_BASED_OUTPATIENT_CLINIC_OR_DEPARTMENT_OTHER): Payer: BLUE CROSS/BLUE SHIELD | Admitting: Hematology

## 2016-10-16 VITALS — BP 122/78 | HR 65 | Temp 98.5°F | Resp 18 | Ht 71.0 in | Wt 167.2 lb

## 2016-10-16 DIAGNOSIS — F329 Major depressive disorder, single episode, unspecified: Secondary | ICD-10-CM

## 2016-10-16 DIAGNOSIS — E46 Unspecified protein-calorie malnutrition: Secondary | ICD-10-CM | POA: Diagnosis not present

## 2016-10-16 DIAGNOSIS — C250 Malignant neoplasm of head of pancreas: Secondary | ICD-10-CM | POA: Diagnosis not present

## 2016-10-16 DIAGNOSIS — C259 Malignant neoplasm of pancreas, unspecified: Secondary | ICD-10-CM

## 2016-10-16 LAB — COMPREHENSIVE METABOLIC PANEL
ALT: 23 U/L (ref 0–55)
AST: 18 U/L (ref 5–34)
Albumin: 3.5 g/dL (ref 3.5–5.0)
Alkaline Phosphatase: 93 U/L (ref 40–150)
Anion Gap: 9 mEq/L (ref 3–11)
BUN: 8.7 mg/dL (ref 7.0–26.0)
CHLORIDE: 105 meq/L (ref 98–109)
CO2: 28 meq/L (ref 22–29)
CREATININE: 0.8 mg/dL (ref 0.7–1.3)
Calcium: 9.7 mg/dL (ref 8.4–10.4)
EGFR: >90 mL/min/{1.73_m2} (ref >90)
GLUCOSE: 88 mg/dL (ref 70–140)
POTASSIUM: 4 meq/L (ref 3.5–5.1)
SODIUM: 141 meq/L (ref 136–145)
Total Bilirubin: 0.41 mg/dL (ref 0.20–1.20)
Total Protein: 6.6 g/dL (ref 6.4–8.3)

## 2016-10-16 LAB — CBC WITH DIFFERENTIAL/PLATELET
BASO%: 0.2 % (ref 0.0–2.0)
BASOS ABS: 0 10*3/uL (ref 0.0–0.1)
EOS%: 3.4 % (ref 0.0–7.0)
Eosinophils Absolute: 0.2 10*3/uL (ref 0.0–0.5)
HCT: 33.9 % — ABNORMAL LOW (ref 38.4–49.9)
HGB: 10.9 g/dL — ABNORMAL LOW (ref 13.0–17.1)
LYMPH%: 25.9 % (ref 14.0–49.0)
MCH: 25.5 pg — AB (ref 27.2–33.4)
MCHC: 32.2 g/dL (ref 32.0–36.0)
MCV: 79.2 fL — ABNORMAL LOW (ref 79.3–98.0)
MONO#: 0.5 10*3/uL (ref 0.1–0.9)
MONO%: 9.7 % (ref 0.0–14.0)
NEUT#: 3.1 10*3/uL (ref 1.5–6.5)
NEUT%: 60.8 % (ref 39.0–75.0)
Platelets: 194 10*3/uL (ref 140–400)
RBC: 4.28 10*6/uL (ref 4.20–5.82)
RDW: 15.8 % — ABNORMAL HIGH (ref 11.0–14.6)
WBC: 5.1 10*3/uL (ref 4.0–10.3)
lymph#: 1.3 10*3/uL (ref 0.9–3.3)

## 2016-10-16 NOTE — Progress Notes (Signed)
Nutrition follow-up completed with patient after M.D. visit. Patient is going to receive chemotherapy for pancreas cancer status post Whipple. Patient reports that TNA is now discontinued. He does not have a feeding tube. Weight has decreased and was documented as 167.2 pounds March 8, down from 170.4 pounds February 23. Patient continues to eat soup and drink 2 Glucerna daily. Patient denies nausea, vomiting, diarrhea or constipation. Patient states he thinks he can eat anything he wants.  He does not have a good appetite.  Nutrition diagnosis: Unintended weight loss continues.  Intervention: Educated patient to DC Glucerna and change to boost +4 times daily between meals as tolerated. Encouraged him to eat small amounts of food at mealtimes. Patient reports that he will try to work up to 4 shakes daily. Questions were answered.  Teach back method used.  Monitoring, evaluation, goals: Patient will work to increase oral intake to promote weight maintenance.  Next visit: Friday, March 23, during infusion.  **Disclaimer: This note was dictated with voice recognition software. Similar sounding words can inadvertently be transcribed and this note may contain transcription errors which may not have been corrected upon publication of note.**

## 2016-10-16 NOTE — Telephone Encounter (Signed)
Appointments scheduled per 3/8 LOS. Scheduled on Fridays instead of Thursday according to treatment plan. Patient given AVS report and calendars with future scheduled appointments.

## 2016-10-17 MED ORDER — CAPECITABINE 500 MG PO TABS: 825.0000 mg/m2 | ORAL_TABLET | Freq: Two times a day (BID) | ORAL | 1 refills | Status: DC

## 2016-10-17 NOTE — Telephone Encounter (Signed)
Oral Chemotherapy Pharmacist Encounter  Called [employer] [employer] at [phone] to follow-up on appeal for Xeloda prior authorization denial sent 10/14/16 late afternoon. I LVM for plan to call Oral Oncology Clinic with update on determination.  Oral Oncology Clinic will continue to follow.  Johny Drilling, PharmD, BCPS, BCOP 10/17/2016  12:04 PM Oral Oncology Clinic [phone]

## 2016-10-17 NOTE — Telephone Encounter (Signed)
Oral Chemotherapy Pharmacist Encounter  Received call back from [place] that PA denial for Xeloda has been overturned, PA is now approved. Per patient's prescription insurance coverage, prescription must be filled at a [place] e-scribed to [facility] in [place], [place] (ph: [phone])  Oral Oncology Clinic will continue to follow.  Johny Drilling, PharmD, BCPS, BCOP 10/17/2016  4:09 PM Oral Oncology Clinic [phone]

## 2016-10-18 ENCOUNTER — Encounter: Payer: Self-pay | Admitting: Hematology

## 2016-10-20 NOTE — Progress Notes (Signed)
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 Question loss of fat plane between the posterior aspect of mass and IVC. Mildly dilated CBD 10 mm.      03/13/2016 Procedure    ERCP and medical stem placement in CBD      03/20/2016 Initial Diagnosis    Primary pancreatic adenocarcinoma ([place])      03/20/2016 Initial Biopsy    FNA of the pancreatic head mass from EUS showed a malignant cells consistent with adenocarcinoma      03/20/2016 Procedure    EUS showed an irregular mass in the pancreatic head, measuring 3.9 x 3.2 cm, no invading of the celiac trunk or portal vein. The mass was biopsied.       04/09/2016 -  Neo-Adjuvant Chemotherapy    Gemcitabine and Abraxane on Day 1, 8 every 21 days       06/05/2016 Imaging    CT CAP w Contrast 06/05/16 IMPRESSION: Mildly decreased size of mass within pancreatic head and uncinateprocess, with common bile duct stent in appropriate position. No evidence of metastatic disease or other acute findings within thechest, abdomen, or  pelvis.      08/21/2016 Surgery    Whipple surgery by Dr. Barry Dienes       08/21/2016 Pathology Results    Whipple surgery showed ampullary invasive adenocarcinoma, moderately differentiated, 2.8 cm, tumor invades peripancreatic tissue and pancreas more than 0.5 cm, resection margins are negative, metastatic carcinoma in 1 of 16 lymph nodes. Biopsy of the liver and portal lymph nodes are negative.       08/31/2016 Imaging    CT CAP w Contrast 08/31/16 IMPRESSION: 1. Status post classic appearing Whipple procedure by dehiscence of suture sites. Marked gastric distention with fluid and food may reflect delayed gastric emptying. Fluid-filled jejunum has the appearance of postoperative ileus. No definite transition site is identified. 2. No sterile fluid collection or abscess. There are mottled gas like lucencies in the gallbladder fossa which may represent postop change.      HISTORY OF PRESENTING ILLNESS:  Craig [name] 61 y.o. male is here because of His newly diagnosed pancreatic cancer. He is coming up by his wife to our multidisciplinary check clinic today.  He has had epigastric pain for the last 4-6 weeks. He describes the pain is intermittent, last a few hours and a few times a day, located in the low mid chest and upper abdomen, not related to eating or position. He denies nausea, cough, or dyspnea. His appetite has decreased lately, and he complains about moderate fatigue, he was not able  to tolerate his physically demanding job, and he has been out of week for 3 weeks. He lost about 30-40 lbs in the past 2 months.   He was seen by his primary care physician a few times, and he developed jaundice, and abnormal liver function. He was sent to Hospital by his primary care physician. During his hospital stay, CT scan reviewed a pancreatic head mass, and mild dilatation of bile duct. He was seen by GI Dr. Jacelyn Grip, underwent ERCP and metal stent placement in CBD. He subsequently underwent EUS  which showed an irregular mass in the pancreatic head, measuring 3.9 cm, no invading of the celiac trunk or portal vein. The mass was biopsied, which showed adenocarcinoma.  He is married, lives with his wife. His abdominal pain, fatigue has not changed much since the CBD stent placement. He is able to tolerate routine activities, no other new complaints. He has been drinking beer moderately for the past 40 years, but stopped a few weeks ago.   CURRENT THERAPY: adjuvant chemotherapy with Gemcitabine and Xeloda, starting today   INTERIM HISTORY: Mr. Efferson returns for follow-up and treatment. He has been doing well. He is still waiting on financial assistance for the Xeloda and has not yet received the medication. He lost 6 lbs in the last week. His appetite hasn't been good. He can only eat 2 small meals a day; typically chicken, fish, or soup. He drinks 2 boost a day. Denies fatigue, diarrhea, constipation, or any other concerns.   MEDICAL HISTORY:  Past Medical History:  Diagnosis Date  . Diabetes mellitus without complication ([place])   . ETOH abuse   . Hypertension    off bp meds 3 4- months ago  . Primary pancreatic adenocarcinoma ([place]) 03/28/2016  . Type 2 diabetes mellitus ([place])     SURGICAL HISTORY: Past Surgical History:  Procedure Laterality Date  . ERCP Left 03/13/2016   Procedure: ENDOSCOPIC RETROGRADE CHOLANGIOPANCREATOGRAPHY (ERCP);  Surgeon: Carol Ada, MD;  Location: [facility] ENDOSCOPY;  Service: Endoscopy;  Laterality: Left;  . EUS N/A 03/20/2016   Procedure: UPPER ENDOSCOPIC ULTRASOUND (EUS) LINEAR;  Surgeon: Carol Ada, MD;  Location: [facility] ENDOSCOPY;  Service: Endoscopy;  Laterality: N/A;  . HERNIA REPAIR    . KNEE ARTHROSCOPY     left and right  . LAPAROSCOPY N/A 08/21/2016   Procedure: LAPAROSCOPY DIAGNOSTIC;  Surgeon: Stark Klein, MD;  Location: [facility] ORS;  Service: General;  Laterality: N/A;  . PORTACATH PLACEMENT N/A 04/07/2016   Procedure: INSERTION PORT-A-CATH;  Surgeon:  Stark Klein, MD;  Location: [facility];  Service: General;  Laterality: N/A;  . WHIPPLE PROCEDURE N/A 08/21/2016   Procedure: WHIPPLE PROCEDURE;  Surgeon: Stark Klein, MD;  Location: [facility] ORS;  Service: General;  Laterality: N/A;    SOCIAL HISTORY: Social History   Social History  . Marital status: Married    Spouse name: N/A  . Number of children: N/A  . Years of education: N/A   Occupational History  . Not on file.   Social History Main Topics  . Smoking status: Former Smoker    Packs/day: 1.00    Years: 15.00    Quit date: 08/12/1995  . Smokeless tobacco: Never Used  . Alcohol use Yes     Comment: he used to drink 6 pack beer a day for 40 years, quit in 02/2016   . Drug use: No  . Sexual activity: Not on file   Other Topics Concern  . Not on file   Social History  Narrative  . No narrative on file    FAMILY HISTORY: Family History  Problem Relation Age of Onset  . Heart attack Mother     ALLERGIES:  is allergic to no known allergies.  MEDICATIONS:  Current Outpatient Prescriptions  Medication Sig Dispense Refill  . bisacodyl (DULCOLAX) 10 MG suppository Place 1 suppository (10 mg total) rectally daily as needed for moderate constipation. 12 suppository 0  . folic acid (FOLVITE) 1 MG tablet TAKE ONE TABLET BY MOUTH DAILY 30 tablet 0  . Insulin Glargine (LANTUS SOLOSTAR) 100 UNIT/ML Solostar Pen Inject 16 Units into the skin every morning. 15 mL 11  . lidocaine-prilocaine (EMLA) cream Apply to affected area once 30 g 3  . LORazepam (ATIVAN) 1 MG tablet Take 0.5-1 tablets (0.5-1 mg total) by mouth 3 (three) times daily as needed for anxiety. 15 tablet 0  . methocarbamol (ROBAXIN) 500 MG tablet Take 1 tablet (500 mg total) by mouth every 8 (eight) hours as needed for muscle spasms. 90 tablet 2  . metoCLOPramide (REGLAN) 10 MG tablet Take 1 tablet (10 mg total) by mouth 4 (four) times daily. 120 tablet 3  . metoprolol tartrate (LOPRESSOR) 25 MG tablet Take 0.5 tablets (12.5  mg total) by mouth 2 (two) times daily. 60 tablet 1  . mirtazapine (REMERON) 15 MG tablet Take 1 tablet (15 mg total) by mouth at bedtime. 30 tablet 1  . naproxen sodium (ALEVE) 220 MG tablet Take 440 mg by mouth daily as needed (pain).     . Nutritional Supplements (FEEDING SUPPLEMENT, VITAL AF 1.2 CAL,) LIQD Place 1,000 mLs into feeding tube continuous. 5000 mL 12  . ondansetron (ZOFRAN) 8 MG tablet Take 1 tablet (8 mg total) by mouth every 8 (eight) hours as needed (Nausea or vomiting). 30 tablet 1  . oxyCODONE (OXY IR/ROXICODONE) 5 MG immediate release tablet Take 1-2 tablets (5-10 mg total) by mouth every 4 (four) hours as needed for moderate pain, severe pain or breakthrough pain. 60 tablet 0  . Pancrelipase, Lip-Prot-Amyl, (ZENPEP PO) Take 2 capsules by mouth 3 (three) times daily.    . pantoprazole (PROTONIX) 40 MG tablet Take 1 tablet (40 mg total) by mouth daily. 30 tablet 3  . potassium chloride SA (K-DUR,KLOR-CON) 20 MEQ tablet Take 1 tablet (20 mEq total) by mouth daily. 30 tablet 1  . prochlorperazine (COMPAZINE) 10 MG tablet Take 1 tablet (10 mg total) by mouth every 8 (eight) hours as needed (Nausea or vomiting). 30 tablet 1  . simethicone (MYLICON) 80 MG chewable tablet Chew 1 tablet (80 mg total) by mouth 3 (three) times daily. 30 tablet 0  . thiamine 100 MG tablet Take 1 tablet (100 mg total) by mouth daily. 30 tablet 0  . capecitabine (XELODA) 500 MG tablet Take 3 tablets (1,500 mg total) by mouth 2 (two) times daily after a meal. For 21 days on, 7 days off (Patient not taking: Reported on 10/24/2016) 126 tablet 1   No current facility-administered medications for this visit.     REVIEW OF SYSTEMS:   Constitutional: Denies fevers, chills or abnormal night sweats (+) Poor appetite (+) weight loss, 6 lbs in 1 week Eyes: Denies blurriness of vision, double vision or watery eyes Ears, nose, mouth, throat, and face: Denies mucositis or sore throat Respiratory: Denies cough, dyspnea  or wheezes Cardiovascular: Denies palpitation, chest discomfort or lower extremity swelling Gastrointestinal:  Denies nausea, heartburn or change in bowel habits Skin: Denies abnormal skin rashes Lymphatics: Denies new lymphadenopathy  or easy bruising Neurological:Denies numbness, tingling or new weaknesses Behavioral/Psych: Mood is stable, no new changes  All other systems were reviewed with the patient and are negative.  PHYSICAL EXAMINATION: ECOG PERFORMANCE STATUS: 1 - Symptomatic but completely ambulatory   Vitals:   10/24/16 0843  BP: 117/87  Pulse: 83  Resp: 18  Temp: 98.6 F (37 C)  TempSrc: Oral  SpO2: 100%  Weight: 161 lb 12.8 oz (73.4 kg)  Height: 5\' 11"  (1.803 m)    GENERAL:alert, no distress and comfortable. In treatment chair. SKIN: skin color, texture, turgor are normal, no rashes or significant lesions EYES: normal, conjunctiva are pink and non-injected, sclera clear OROPHARYNX:no exudate, no erythema and lips, buccal mucosa, and tongue normal  NECK: supple, thyroid normal size, non-tender, without nodularity LYMPH:  no palpable lymphadenopathy in the cervical, axillary or inguinal LUNGS: clear to auscultation and percussion with normal breathing effort HEART: regular rate & rhythm and no murmurs and no lower extremity edema ABDOMEN: (+) Feeding tube in the left lower abdomen. Musculoskeletal:no cyanosis of digits and no clubbing  Extremities: no edema, PICC line has been removed  PSYCH: alert & oriented x 3 with fluent speech NEURO: no focal motor/sensory deficits  LABORATORY DATA:  I have reviewed the data as listed CBC Latest Ref Rng & Units 10/24/2016 10/16/2016 10/03/2016  WBC 4.0 - 10.3 10e3/uL 4.0 5.1 4.6  Hemoglobin 13.0 - 17.1 g/dL 11.6(L) 10.9(L) 10.2(L)  Hematocrit 38.4 - 49.9 % 36.2(L) 33.9(L) 31.8(L)  Platelets 140 - 400 10e3/uL 241 194 226   CMP Latest Ref Rng & Units 10/16/2016 10/03/2016 09/05/2016  Glucose 70 - 140 mg/dl 88 231(H) 182(H)  BUN  7.0 - 26.0 mg/dL 8.7 10.6 10  Creatinine 0.7 - 1.3 mg/dL 0.8 0.8 0.58(L)  Sodium 136 - 145 mEq/L 141 136 134(L)  Potassium 3.5 - 5.1 mEq/L 4.0 4.3 4.1  Chloride 101 - 111 mmol/L - - 101  CO2 22 - 29 mEq/L 28 26 29   Calcium 8.4 - 10.4 mg/dL 9.7 9.4 8.4(L)  Total Protein 6.4 - 8.3 g/dL 6.6 6.4 5.7(L)  Total Bilirubin 0.20 - 1.20 mg/dL 0.41 0.32 0.2(L)  Alkaline Phos 40 - 150 U/L 93 85 73  AST 5 - 34 U/L 18 15 18   ALT 0 - 55 U/L 23 29 19    PATHOLOGY REPORT   Diagnosis 08/21/16 1. Liver, biopsy - BENIGN VASCULAR PROLIFERATION. - NO MALIGNANCY IDENTIFIED. 2. Whipple procedure/resection - AMPULLARY INVASIVE ADENOCARCINOMA, MODERATELY DIFFERENTIATED, SPANNING 2.8 CM. - TUMOR INVADES PERIPANCREATIC TISSUE AND PANCREAS >0.5 CM. - RESECTION MARGINS ARE NEGATIVE. - METASTATIC CARCINOMA IN ONE OF SIXTEEN LYMPH NODES (1/16). - CHRONIC GASTRITIS. - CHRONIC CHOLECYSTITIS. - SEE ONCOLOGY TABLE. 3. Lymph node, biopsy, portal - ONE OF ONE LYMPH NODE NEGATIVE FOR CARCINOMA (0/1). - LIPOGRANULOMATOUS CHANGE. 4. Lymph node, biopsy, common hepatic - ONE OF ONE LYMPH NODE NEGATIVE FOR CARCINOMA (0/1). - LIPOGRANULOMATOUS CHANGE. 5. Lymph node, biopsy, Peripancreatic - ONE OF ONE LYMPH NODE NEGATIVE FOR CARCINOMA (0/1).  Diagnosis 03/20/2016 FINE NEEDLE ASPIRATION, ENDOSCOPIC, PANCREAS HEAD (SPECIMEN 1 OF 1 COLLECTED 03/20/16): MALIGNANT CELLS CONSISTENT WITH ADENOCARCINOMA. Preliminary Diagnosis Intraoperative Diagnosis: Adequate. ([facility])  Diagnosis 03/13/2016 Duodenum, Biopsy, Duodenal mass - BENIGN ULCERATED AND INFLAMED SMALL BOWEL-TYPE MUCOSA. - THERE IS NO EVIDENCE OF MALIGNANCY. - SEE COMMENT.  RADIOGRAPHIC STUDIES: I have personally reviewed the radiological images as listed and agreed with the findings in the report. No results found.   CT Abdomen Pelvis w/ Contrast 08/31/2016 IMPRESSION: 1. Status post classic  appearing Whipple procedure by dehiscence of suture sites. Marked gastric  distention with fluid and food may reflect delayed gastric emptying. Fluid-filled jejunum has the appearance of postoperative ileus. No definite transition site is identified. 2. No sterile fluid collection or abscess. There are mottled gas like lucencies in the gallbladder fossa which may represent postop change.  EUS Dr. Benson Norway  Endosonographic Finding 03/20/2016 Findings: An irregular mass was identified in the pancreatic head. The mass was hypoechoic. The mass measured 39 mm by 32 mm in maximal cross-sectional diameter. The outer margins were irregular. An intact interface was seen between the mass and the celiac trunk and portal vein suggesting a lack of invasion. Fine needle aspiration for cytology was performed. Color Doppler imaging was utilized prior to needle puncture to confirm a lack of significant vascular structures within the needle path. Five passes were made with the 25 gauge needle using a transduodenal approach. A stylet was used. A cytotechnologist was present to evaluate the adequacy of the specimen. The cellularity of the specimen was adequate. Final cytology results are pending. - A mass was identified in the pancreatic head. Tissue was obtained from this exam, and results are pending. However, the endosonographic appearance is highly suspicious for adenocarcinoma. Fine needle aspiration performed.  ERCP 03/13/2016 Impression: - One covered metal stent was placed into the common bile duct.  CT chest, abdomen, pelvis w contrast 06/05/2016 IMPRESSION: Mildly decreased size of mass within pancreatic head and uncinate process, with common bile duct stent in appropriate position.  No evidence of metastatic disease or other acute findings within the chest, abdomen, or pelvis.  ASSESSMENT & PLAN:  61 y.o. African-American male, with past medical history of diabetes and hypertension, presented with epigastric pain, weight loss, and jaundice.  1. 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 -The patient underwent surgery with Dr. Barry Dienes on 08/21/2016 with Whipple procedure/resection of the pancreas, liver biopsy, and lymph node biopsy. Liver biopsy was benign. Whipple procedure revealed moderately differentiated ampullary invasive adenocarcinoma measuring 2.8 cm. The tumor invaded the peripancreatic tissue and the pancreas > 0.5 cm. The resection margins were negative. 1/19 biopsied lymph nodes were positive. -We previously discussed the patient's pathology and high risk of recurrence after surgery disease locally advanced disease even after neoadjuvant therapy.  -I recommend adjuvant chemo with gemcitabine and Xeloda, to reduce his risk of recurrence. I will likely give him 2 more months (he had neoadjuvant chemo for 4 months). The benefit and risks were discussed with him. He agrees to proceed. Chemotherapy consent was obtained. -The goal of therapy is curative.  -I previously discussed with radiation oncologist Dr. Lisbeth Renshaw and he does not recommend adjuvant radiation   -He has recovered better from surgery, he is off TPN now. But he has lost 6lbs since he stopped TPN, I strongly encouraged him to eat and drink more. -Lab reviewed, normal CBC and CMP, anemia continues to improve.  -He has not received the xeloda yet, still in the process for copay assistance, will start gemcitabine first today  -Patient will call me when she he receives Xeloda. Due to his weight loss, start on lower dose: 2 in the morning, 2 in the evening  2. HTN and DM - he will continue medication and follow up with his primary care physician  -We again reviewed that his blood pressure and blood glucose will need to be monitored closely during the chemotherapy,  and his medication may need to be adjusted  -His hypoglycemia has been better controlled  lately. I again discussed diabetic diet, and I strongly encouraged him to exercise.   3. Malnutrition and weight loss -He has lost 6 lbs in the past week -he was seen by our dietitian Pamala Hurry and will follow up. -We'll watch his weight and nutrition status closely when he is on chemotherapy. -He is on TPN and has a PEG tube. The patient states he is not using his PEG tube and is not taking Glucerna due to fear of spiking his sugar. -He only eats 2 small meals a day with 2 cans of Ensure. I encouraged him to eat more, or increase his Ensure intake. -He will talk to our nutritionist again.  -he is off TPN now  -I encouraged him to eat 4-5 small meals a day.   4. Depression -The patient is depressed about his diagnosis. -We again discussed the patient speaking with social work and the chaplain for which he has their numbers. -He tried mirtazapine, but did not like it and has stopped .  Plan -Lab reviewed, adequate for treatment, we'll start gemcitabine 1000 mg/m weekly for 2 weeks on, 1 week off, today -He will call me when he receives Xeloda, we'll start him on reduced dose 1000mg  bid first  -f/u in one week for C1D8 treatment    All questions were answered. The patient knows to call the clinic with any problems, questions or concerns.  I spent 15 minutes counseling the patient face to face. The total time spent in the appointment was 20 minutes and more than 50% was on counseling.  This document serves as a record of services personally performed by Truitt Merle, MD. It was created on her behalf by Martinique Casey, a trained medical scribe. The creation of this record is based on the scribe's personal observations and the provider's statements to them. This document has been checked and approved by the attending provider.  I have reviewed the above documentation for accuracy and completeness and I agree with the above.   Truitt Merle, MD 10/24/2016

## 2016-10-20 NOTE — Telephone Encounter (Signed)
Oral Chemotherapy Pharmacist Encounter  Received notification from [facility] with request for ICD-10 code, current medication list, and chart notes for patient's Xeloda prescription sent to [facility] on 10/17/16. Requested information, plus demographic info sheet and insurance information faxed to [place] at [facility] will continue to follow.  Johny Drilling, PharmD, BCPS, BCOP 10/20/2016  4:17 PM Oral Oncology Clinic [phone]

## 2016-10-21 NOTE — Telephone Encounter (Signed)
Oral Chemotherapy Pharmacist Encounter  I called [place] at [phone] for update on patient's Xeloda prescription since the office had received a PA request from [facility] [facility] today (10/21/16) despite receiving PA approval after overturning a PA denial last week. Rx still in process. Pharmacist will call me back later this afternoon with an update.   Johny Drilling, PharmD, BCPS, BCOP 10/21/2016  3:20 PM Oral Oncology Clinic [phone]

## 2016-10-22 NOTE — Telephone Encounter (Signed)
Oral Chemotherapy Pharmacist Encounter  Received update from [facility] that Xeloda prescription has been processed. Copayment ~ $300 and patient has requested [employer] refer patient to their social work team to identify copayment support.  Oral Oncology Clinic will continue to follow.  Johny Drilling, PharmD, BCPS, BCOP 10/22/2016  10:16 AM Oral Oncology Clinic [phone]

## 2016-10-23 NOTE — Telephone Encounter (Signed)
Oral Chemotherapy Pharmacist Encounter  Received message from Dr. Ernestina Penna collaborative practice RN that patient had called yesterday (10/22/16) with concerns about Xeloda copayment. Copayment for Xeloda for 1 cycle = $310.52  I called [facility] [place] to inquire about copayment assistance that they had offered to patient. They stated they had offered to enroll patient for copayment funds from [facility] which currently has open funds for pancreatic cancer that would cover all of the Xeloda copayment costs for patient's entire length of Gemzar/Xeloda therapy. They stated patient declined for them to enroll him for these funds until he spoke with our office.  I called and s/w patient about copayment foundation funds and urged patient to call [place] at [phone] and let them help find copayment assistance.   Patient then questioned why he had to drive to [facility] to pick up his medications. Upon further questioning of this statement I discovered that patient assumed this was the case because that is the only [employer] that he is aware of. I explained that his prescription insurance required his medication be filled at a [facility] and that the pharmacy will mail him his medication wherever he would like; he will not have to drive to [facility] to pick it up.  Patient reluctantly agreed to reach out to patient assistance team at [facility] to obtain his Xeloda.  Patient scheduled to start treatment tomorrow (10/24/16) and will let us know in the morning if he had any issues with the pharmacy.  Oral Oncology Clinic will continue to follow.  Craig [name], PharmD, BCPS, BCOP 10/23/2016  2:45 PM Oral Oncology Clinic [phone]

## 2016-10-24 ENCOUNTER — Encounter: Payer: Self-pay | Admitting: Hematology

## 2016-10-24 ENCOUNTER — Other Ambulatory Visit (HOSPITAL_BASED_OUTPATIENT_CLINIC_OR_DEPARTMENT_OTHER): Payer: BLUE CROSS/BLUE SHIELD

## 2016-10-24 ENCOUNTER — Ambulatory Visit (HOSPITAL_BASED_OUTPATIENT_CLINIC_OR_DEPARTMENT_OTHER): Payer: BLUE CROSS/BLUE SHIELD | Admitting: Hematology

## 2016-10-24 ENCOUNTER — Ambulatory Visit: Payer: BLUE CROSS/BLUE SHIELD

## 2016-10-24 ENCOUNTER — Ambulatory Visit (HOSPITAL_BASED_OUTPATIENT_CLINIC_OR_DEPARTMENT_OTHER): Payer: BLUE CROSS/BLUE SHIELD

## 2016-10-24 VITALS — BP 117/87 | HR 83 | Temp 98.6°F | Resp 18 | Ht 71.0 in | Wt 161.8 lb

## 2016-10-24 DIAGNOSIS — C259 Malignant neoplasm of pancreas, unspecified: Secondary | ICD-10-CM

## 2016-10-24 DIAGNOSIS — Z5111 Encounter for antineoplastic chemotherapy: Secondary | ICD-10-CM | POA: Diagnosis not present

## 2016-10-24 DIAGNOSIS — E46 Unspecified protein-calorie malnutrition: Secondary | ICD-10-CM

## 2016-10-24 DIAGNOSIS — C250 Malignant neoplasm of head of pancreas: Secondary | ICD-10-CM

## 2016-10-24 DIAGNOSIS — R634 Abnormal weight loss: Secondary | ICD-10-CM | POA: Diagnosis not present

## 2016-10-24 DIAGNOSIS — F329 Major depressive disorder, single episode, unspecified: Secondary | ICD-10-CM | POA: Diagnosis not present

## 2016-10-24 DIAGNOSIS — Z95828 Presence of other vascular implants and grafts: Secondary | ICD-10-CM

## 2016-10-24 LAB — CBC WITH DIFFERENTIAL/PLATELET
BASO%: 0.4 % (ref 0.0–2.0)
BASOS ABS: 0 10*3/uL (ref 0.0–0.1)
EOS ABS: 0.2 10*3/uL (ref 0.0–0.5)
EOS%: 4.6 % (ref 0.0–7.0)
HCT: 36.2 % — ABNORMAL LOW (ref 38.4–49.9)
HGB: 11.6 g/dL — ABNORMAL LOW (ref 13.0–17.1)
LYMPH%: 28.7 % (ref 14.0–49.0)
MCH: 25.9 pg — ABNORMAL LOW (ref 27.2–33.4)
MCHC: 32.1 g/dL (ref 32.0–36.0)
MCV: 80.6 fL (ref 79.3–98.0)
MONO#: 0.5 10*3/uL (ref 0.1–0.9)
MONO%: 11.7 % (ref 0.0–14.0)
NEUT#: 2.2 10*3/uL (ref 1.5–6.5)
NEUT%: 54.6 % (ref 39.0–75.0)
Platelets: 241 10*3/uL (ref 140–400)
RBC: 4.5 10*6/uL (ref 4.20–5.82)
RDW: 16 % — ABNORMAL HIGH (ref 11.0–14.6)
WBC: 4 10*3/uL (ref 4.0–10.3)
lymph#: 1.2 10*3/uL (ref 0.9–3.3)

## 2016-10-24 LAB — COMPREHENSIVE METABOLIC PANEL
ALBUMIN: 3.5 g/dL (ref 3.5–5.0)
ALK PHOS: 94 U/L (ref 40–150)
ALT: 33 U/L (ref 0–55)
AST: 22 U/L (ref 5–34)
Anion Gap: 8 mEq/L (ref 3–11)
BUN: 6.2 mg/dL — ABNORMAL LOW (ref 7.0–26.0)
CHLORIDE: 106 meq/L (ref 98–109)
CO2: 27 mEq/L (ref 22–29)
Calcium: 9.5 mg/dL (ref 8.4–10.4)
Creatinine: 0.8 mg/dL (ref 0.7–1.3)
GLUCOSE: 116 mg/dL (ref 70–140)
POTASSIUM: 3.9 meq/L (ref 3.5–5.1)
SODIUM: 142 meq/L (ref 136–145)
Total Bilirubin: 0.51 mg/dL (ref 0.20–1.20)
Total Protein: 6.6 g/dL (ref 6.4–8.3)

## 2016-10-24 MED ORDER — SODIUM CHLORIDE 0.9 % IV SOLN
Freq: Once | INTRAVENOUS | Status: AC
  Administered 2016-10-24: 10:00:00 via INTRAVENOUS

## 2016-10-24 MED ORDER — HEPARIN SOD (PORK) LOCK FLUSH 100 UNIT/ML IV SOLN
500.0000 [IU] | Freq: Once | INTRAVENOUS | Status: AC | PRN
  Administered 2016-10-24: 500 [IU]
  Filled 2016-10-24: qty 5

## 2016-10-24 MED ORDER — SODIUM CHLORIDE 0.9 % IJ SOLN
10.0000 mL | INTRAMUSCULAR | Status: DC | PRN
  Administered 2016-10-24: 10 mL via INTRAVENOUS
  Filled 2016-10-24: qty 10

## 2016-10-24 MED ORDER — PROCHLORPERAZINE MALEATE 10 MG PO TABS
10.0000 mg | ORAL_TABLET | Freq: Once | ORAL | Status: AC
  Administered 2016-10-24: 10 mg via ORAL

## 2016-10-24 MED ORDER — SODIUM CHLORIDE 0.9% FLUSH
10.0000 mL | INTRAVENOUS | Status: DC | PRN
  Administered 2016-10-24: 10 mL
  Filled 2016-10-24: qty 10

## 2016-10-24 MED ORDER — PROCHLORPERAZINE MALEATE 10 MG PO TABS
ORAL_TABLET | ORAL | Status: AC
  Filled 2016-10-24: qty 1

## 2016-10-24 MED ORDER — SODIUM CHLORIDE 0.9 % IV SOLN
1000.0000 mg/m2 | Freq: Once | INTRAVENOUS | Status: AC
  Administered 2016-10-24: 1976 mg via INTRAVENOUS
  Filled 2016-10-24: qty 51.97

## 2016-10-24 NOTE — Patient Instructions (Signed)
[facility] Discharge Instructions for Patients Receiving Chemotherapy  Today you received the following chemotherapy agents Gemzar  To help prevent nausea and vomiting after your treatment, we encourage you to take your nausea medication as prescribed   If you develop nausea and vomiting that is not controlled by your nausea medication, call the clinic.   BELOW ARE SYMPTOMS THAT SHOULD BE REPORTED IMMEDIATELY:  *FEVER GREATER THAN 100.5 F  *CHILLS WITH OR WITHOUT FEVER  NAUSEA AND VOMITING THAT IS NOT CONTROLLED WITH YOUR NAUSEA MEDICATION  *UNUSUAL SHORTNESS OF BREATH  *UNUSUAL BRUISING OR BLEEDING  TENDERNESS IN MOUTH AND THROAT WITH OR WITHOUT PRESENCE OF ULCERS  *URINARY PROBLEMS  *BOWEL PROBLEMS  UNUSUAL RASH Items with * indicate a potential emergency and should be followed up as soon as possible.  Feel free to call the clinic you have any questions or concerns. The clinic phone number is [phone].  Please show the CHEMO ALERT CARD at check-in to the Emergency Department and triage nurse.   

## 2016-10-24 NOTE — Patient Instructions (Signed)
Implanted Port Home Guide An implanted port is a type of central line that is placed under the skin. Central lines are used to provide IV access when treatment or nutrition needs to be given through a person's veins. Implanted ports are used for long-term IV access. An implanted port may be placed because:  You need IV medicine that would be irritating to the small veins in your hands or arms.  You need long-term IV medicines, such as antibiotics.  You need IV nutrition for a long period.  You need frequent blood draws for lab tests.  You need dialysis.  Implanted ports are usually placed in the chest area, but they can also be placed in the upper arm, the abdomen, or the leg. An implanted port has two main parts:  Reservoir. The reservoir is round and will appear as a small, raised area under your skin. The reservoir is the part where a needle is inserted to give medicines or draw blood.  Catheter. The catheter is a thin, flexible tube that extends from the reservoir. The catheter is placed into a large vein. Medicine that is inserted into the reservoir goes into the catheter and then into the vein.  How will I care for my incision site? Do not get the incision site wet. Bathe or shower as directed by your health care provider. How is my port accessed? Special steps must be taken to access the port:  Before the port is accessed, a numbing cream can be placed on the skin. This helps numb the skin over the port site.  Your health care provider uses a sterile technique to access the port. ? Your health care provider must put on a mask and sterile gloves. ? The skin over your port is cleaned carefully with an antiseptic and allowed to dry. ? The port is gently pinched between sterile gloves, and a needle is inserted into the port.  Only "non-coring" port needles should be used to access the port. Once the port is accessed, a blood return should be checked. This helps ensure that the port  is in the vein and is not clogged.  If your port needs to remain accessed for a constant infusion, a clear (transparent) bandage will be placed over the needle site. The bandage and needle will need to be changed every week, or as directed by your health care provider.  Keep the bandage covering the needle clean and dry. Do not get it wet. Follow your health care provider's instructions on how to take a shower or bath while the port is accessed.  If your port does not need to stay accessed, no bandage is needed over the port.  What is flushing? Flushing helps keep the port from getting clogged. Follow your health care provider's instructions on how and when to flush the port. Ports are usually flushed with saline solution or a medicine called heparin. The need for flushing will depend on how the port is used.  If the port is used for intermittent medicines or blood draws, the port will need to be flushed: ? After medicines have been given. ? After blood has been drawn. ? As part of routine maintenance.  If a constant infusion is running, the port may not need to be flushed.  How long will my port stay implanted? The port can stay in for as long as your health care provider thinks it is needed. When it is time for the port to come out, surgery will be   done to remove it. The procedure is similar to the one performed when the port was put in. When should I seek immediate medical care? When you have an implanted port, you should seek immediate medical care if:  You notice a bad smell coming from the incision site.  You have swelling, redness, or drainage at the incision site.  You have more swelling or pain at the port site or the surrounding area.  You have a fever that is not controlled with medicine.  This information is not intended to replace advice given to you by your health care provider. Make sure you discuss any questions you have with your health care provider. Document  Released: 07/28/2005 Document Revised: 01/03/2016 Document Reviewed: 04/04/2013 Elsevier Interactive Patient Education  2017 Elsevier Inc.  

## 2016-10-26 ENCOUNTER — Emergency Department (HOSPITAL_COMMUNITY)
Admission: EM | Admit: 2016-10-26 | Discharge: 2016-10-26 | Disposition: A | Discharge status: Home / Self Care | Payer: BLUE CROSS/BLUE SHIELD | Attending: Emergency Medicine | Admitting: Emergency Medicine

## 2016-10-26 ENCOUNTER — Encounter (HOSPITAL_COMMUNITY): Payer: Self-pay

## 2016-10-26 DIAGNOSIS — E119 Type 2 diabetes mellitus without complications: Secondary | ICD-10-CM | POA: Diagnosis not present

## 2016-10-26 DIAGNOSIS — R1084 Generalized abdominal pain: Secondary | ICD-10-CM | POA: Diagnosis not present

## 2016-10-26 DIAGNOSIS — Z8507 Personal history of malignant neoplasm of pancreas: Secondary | ICD-10-CM | POA: Insufficient documentation

## 2016-10-26 DIAGNOSIS — R51 Headache: Secondary | ICD-10-CM | POA: Insufficient documentation

## 2016-10-26 DIAGNOSIS — Z794 Long term (current) use of insulin: Secondary | ICD-10-CM | POA: Insufficient documentation

## 2016-10-26 DIAGNOSIS — I10 Essential (primary) hypertension: Secondary | ICD-10-CM | POA: Insufficient documentation

## 2016-10-26 DIAGNOSIS — Z79899 Other long term (current) drug therapy: Secondary | ICD-10-CM | POA: Diagnosis not present

## 2016-10-26 DIAGNOSIS — G8929 Other chronic pain: Secondary | ICD-10-CM | POA: Insufficient documentation

## 2016-10-26 DIAGNOSIS — Z87891 Personal history of nicotine dependence: Secondary | ICD-10-CM | POA: Diagnosis not present

## 2016-10-26 DIAGNOSIS — R109 Unspecified abdominal pain: Secondary | ICD-10-CM | POA: Diagnosis present

## 2016-10-26 DIAGNOSIS — R519 Headache, unspecified: Secondary | ICD-10-CM

## 2016-10-26 DIAGNOSIS — M62838 Other muscle spasm: Secondary | ICD-10-CM | POA: Diagnosis not present

## 2016-10-26 LAB — URINALYSIS, ROUTINE W REFLEX MICROSCOPIC
Bilirubin Urine: NEGATIVE
GLUCOSE, UA: NEGATIVE mg/dL
HGB URINE DIPSTICK: NEGATIVE
KETONES UR: NEGATIVE mg/dL
LEUKOCYTES UA: NEGATIVE
Nitrite: NEGATIVE
PROTEIN: NEGATIVE mg/dL
Specific Gravity, Urine: 1.026 (ref 1.005–1.030)
pH: 5 (ref 5.0–8.0)

## 2016-10-26 LAB — CBC
HCT: 34.1 % — ABNORMAL LOW (ref 39.0–52.0)
Hemoglobin: 11.2 g/dL — ABNORMAL LOW (ref 13.0–17.0)
MCH: 25.9 pg — AB (ref 26.0–34.0)
MCHC: 32.8 g/dL (ref 30.0–36.0)
MCV: 78.8 fL (ref 78.0–100.0)
PLATELETS: 220 10*3/uL (ref 150–400)
RBC: 4.33 MIL/uL (ref 4.22–5.81)
RDW: 15.4 % (ref 11.5–15.5)
WBC: 5.7 10*3/uL (ref 4.0–10.5)

## 2016-10-26 LAB — COMPREHENSIVE METABOLIC PANEL
ALK PHOS: 76 U/L (ref 38–126)
ALT: 22 U/L (ref 17–63)
AST: 20 U/L (ref 15–41)
Albumin: 3.6 g/dL (ref 3.5–5.0)
Anion gap: 8 (ref 5–15)
BUN: 8 mg/dL (ref 6–20)
CALCIUM: 9.2 mg/dL (ref 8.9–10.3)
CHLORIDE: 103 mmol/L (ref 101–111)
CO2: 25 mmol/L (ref 22–32)
Creatinine, Ser: 0.8 mg/dL (ref 0.61–1.24)
Glucose, Bld: 178 mg/dL — ABNORMAL HIGH (ref 65–99)
Potassium: 4.1 mmol/L (ref 3.5–5.1)
Sodium: 136 mmol/L (ref 135–145)
Total Bilirubin: 0.8 mg/dL (ref 0.3–1.2)
Total Protein: 6.8 g/dL (ref 6.5–8.1)

## 2016-10-26 LAB — LIPASE, BLOOD: Lipase: <10 U/L — ABNORMAL LOW (ref 11–51)

## 2016-10-26 MED ORDER — LORAZEPAM 2 MG/ML IJ SOLN
0.5000 mg | Freq: Once | INTRAMUSCULAR | Status: AC
  Administered 2016-10-26: 0.5 mg via INTRAVENOUS
  Filled 2016-10-26: qty 1

## 2016-10-26 MED ORDER — HYDROMORPHONE HCL 1 MG/ML IJ SOLN
1.0000 mg | Freq: Once | INTRAMUSCULAR | Status: AC
Start: 2016-10-26 — End: 2016-10-26
  Administered 2016-10-26: 1 mg via INTRAVENOUS
  Filled 2016-10-26: qty 1

## 2016-10-26 NOTE — Discharge Instructions (Signed)
You are having a headache. No specific cause was found today for your headache. It may have been a migraine or other cause of headache. Stress, anxiety, fatigue, and depression are common triggers for headaches. Your headache today does not appear to be life-threatening or require hospitalization, but often the exact cause of headaches is not determined in the emergency department. Therefore, follow-up with your doctor is very important to find out what may have caused your headache, and whether or not you need any further diagnostic testing or treatment. Sometimes headaches can appear benign (not harmful), but then more serious symptoms can develop which should prompt an immediate re-evaluation by your doctor or the emergency department. SEEK MEDICAL ATTENTION IF: You develop possible problems with medications prescribed.  The medications don't resolve your headache, if it recurs , or if you have multiple episodes of vomiting or can't take fluids. You have a change from the usual headache. RETURN IMMEDIATELY IF you develop a sudden, severe headache or confusion, become poorly responsive or faint, develop a fever above 100.38F or problem breathing, have a change in speech, vision, swallowing, or understanding, or develop new weakness, numbness, tingling, incoordination, or have a seizure.  Abdominal (belly) pain can be caused by many things. Your caregiver performed an examination and possibly ordered blood/urine tests and imaging (CT scan, x-rays, ultrasound). Many cases can be observed and treated at home after initial evaluation in the emergency department. Even though you are being discharged home, abdominal pain can be unpredictable. Therefore, you need a repeated exam if your pain does not resolve, returns, or worsens. Most patients with abdominal pain don't have to be admitted to the hospital or have surgery, but serious problems like appendicitis and gallbladder attacks can start out as nonspecific  pain. Many abdominal conditions cannot be diagnosed in one visit, so follow-up evaluations are very important. SEEK IMMEDIATE MEDICAL ATTENTION IF: The pain does not go away or becomes severe.  A temperature above 101 develops.  Repeated vomiting occurs (multiple episodes).  The pain becomes localized to portions of the abdomen. The right side could possibly be appendicitis. In an adult, the left lower portion of the abdomen could be colitis or diverticulitis.  Blood is being passed in stools or vomit (bright red or black tarry stools).  Return also if you develop chest pain, difficulty breathing, dizziness or fainting, or become confused, poorly responsive, or inconsolable (young children).

## 2016-10-26 NOTE — ED Triage Notes (Addendum)
Headache and neck pain with abdominal pain and nausea and vomiting just had a chemo tx this week. States rash in both arm pits with itching.

## 2016-10-26 NOTE — ED Notes (Signed)
Warm blanket provided to pt.

## 2016-10-26 NOTE — ED Provider Notes (Signed)
[facility] DEPT Provider Note   CSN: [id number] Arrival date & time: 10/26/16  0256     History   Chief Complaint Chief Complaint  Patient presents with  . Abdominal Pain  . Headache    HPI Craig [name] is a 61 y.o. male with a hx of Pancreatic cancer who presents the emergency department with chief complaint of abdominal pain, and headache. Patient has a history of chronic abdominal pain. He states it just seems to be a bit worse today. He denies any new changes. The patient also complains of pain in the right side of his neck and an associated headache. He states it has been progressively worsening over the past 24 hours. He complains of pain in the right side of his neck that is worse with movement, especially right lateral rotation of the neck. He denies visual disturbances, nausea, vomiting, fever, neck stiffness. He denies any new rashes. Patient had one episode of loose stool but denies any vomiting or diarrhea.   Abdominal Pain   Associated symptoms include headaches.  Headache      Past Medical History:  Diagnosis Date  . Diabetes mellitus without complication ([place])   . ETOH abuse   . Hypertension    off bp meds 3 4- months ago  . Primary pancreatic adenocarcinoma ([place]) 03/28/2016  . Type 2 diabetes mellitus [facility])     Patient Active Problem List   Diagnosis Date Noted  . Adenocarcinoma of head of pancreas ([place]) 08/21/2016  . Port catheter in place 04/08/2016  . 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 Epigastric pain 03/12/2016  . AKI (acute kidney injury) ([place]) 03/12/2016  . Diabetes mellitus with complication ([place]) 27/01/2375  . ETOH abuse 03/12/2016  . Sinus tachycardia 03/12/2016    Past Surgical History:  Procedure Laterality Date  . ERCP Left 03/13/2016   Procedure: ENDOSCOPIC RETROGRADE CHOLANGIOPANCREATOGRAPHY (ERCP);  Surgeon: Carol Ada, MD;  Location: [facility] ENDOSCOPY;   Service: Endoscopy;  Laterality: Left;  . EUS N/A 03/20/2016   Procedure: UPPER ENDOSCOPIC ULTRASOUND (EUS) LINEAR;  Surgeon: Carol Ada, MD;  Location: [facility] ENDOSCOPY;  Service: Endoscopy;  Laterality: N/A;  . HERNIA REPAIR    . KNEE ARTHROSCOPY     left and right  . LAPAROSCOPY N/A 08/21/2016   Procedure: LAPAROSCOPY DIAGNOSTIC;  Surgeon: Stark Klein, MD;  Location: [facility] ORS;  Service: General;  Laterality: N/A;  . PORTACATH PLACEMENT N/A 04/07/2016   Procedure: INSERTION PORT-A-CATH;  Surgeon: Stark Klein, MD;  Location: [facility];  Service: General;  Laterality: N/A;  . WHIPPLE PROCEDURE N/A 08/21/2016   Procedure: WHIPPLE PROCEDURE;  Surgeon: Stark Klein, MD;  Location: [facility] ORS;  Service: General;  Laterality: N/A;       Home Medications    Prior to Admission medications   Medication Sig Start Date End Date Taking? Authorizing Provider  bisacodyl (DULCOLAX) 10 MG suppository Place 1 suppository (10 mg total) rectally daily as needed for moderate constipation. 09/04/16  Yes Stark Klein, MD  folic acid (FOLVITE) 1 MG tablet TAKE ONE TABLET BY MOUTH DAILY 09/12/16  Yes Truitt Merle, MD  Insulin Glargine (LANTUS SOLOSTAR) 100 UNIT/ML Solostar Pen Inject 16 Units into the skin every morning. 09/04/16  Yes Stark Klein, MD  lidocaine-prilocaine (EMLA) cream Apply to affected area once 04/04/16  Yes Truitt Merle, MD  LORazepam (ATIVAN) 1 MG tablet Take 0.5-1 tablets (0.5-1 mg total) by mouth 3 (three) times daily as needed for anxiety. 09/05/16  Yes  Quintella Reichert, MD  metoCLOPramide (REGLAN) 10 MG tablet Take 1 tablet (10 mg total) by mouth 4 (four) times daily. 09/04/16  Yes Stark Klein, MD  metoprolol tartrate (LOPRESSOR) 25 MG tablet Take 0.5 tablets (12.5 mg total) by mouth 2 (two) times daily. Patient taking differently: Take 12.5 mg by mouth daily.  09/04/16  Yes Stark Klein, MD  ondansetron (ZOFRAN) 8 MG tablet Take 1 tablet (8 mg total) by mouth every 8 (eight) hours as needed (Nausea or vomiting).  09/04/16  Yes Stark Klein, MD  oxyCODONE (OXY IR/ROXICODONE) 5 MG immediate release tablet Take 1-2 tablets (5-10 mg total) by mouth every 4 (four) hours as needed for moderate pain, severe pain or breakthrough pain. 09/04/16  Yes Brooke A Miller, PA-C  Pancrelipase, Lip-Prot-Amyl, (ZENPEP PO) Take 2 capsules by mouth 3 (three) times daily.   Yes Historical Provider, MD  potassium chloride SA (K-DUR,KLOR-CON) 20 MEQ tablet Take 1 tablet (20 mEq total) by mouth daily. 04/25/16  Yes Truitt Merle, MD  prochlorperazine (COMPAZINE) 10 MG tablet Take 1 tablet (10 mg total) by mouth every 8 (eight) hours as needed (Nausea or vomiting). 04/04/16  Yes Truitt Merle, MD  simethicone (MYLICON) 80 MG chewable tablet Chew 1 tablet (80 mg total) by mouth 3 (three) times daily. 09/04/16  Yes Stark Klein, MD  thiamine 100 MG tablet Take 1 tablet (100 mg total) by mouth daily. 04/25/16  Yes Truitt Merle, MD  capecitabine (XELODA) 500 MG tablet Take 3 tablets (1,500 mg total) by mouth 2 (two) times daily after a meal. For 21 days on, 7 days off Patient not taking: Reported on 10/24/2016 10/17/16   Truitt Merle, MD  methocarbamol (ROBAXIN) 500 MG tablet Take 1 tablet (500 mg total) by mouth every 8 (eight) hours as needed for muscle spasms. Patient not taking: Reported on 10/26/2016 09/04/16   Stark Klein, MD  mirtazapine (REMERON) 15 MG tablet Take 1 tablet (15 mg total) by mouth at bedtime. Patient not taking: Reported on 10/26/2016 09/19/16   Truitt Merle, MD  Nutritional Supplements (FEEDING SUPPLEMENT, VITAL AF 1.2 CAL,) LIQD Place 1,000 mLs into feeding tube continuous. Patient not taking: Reported on 10/26/2016 09/04/16   Stark Klein, MD  pantoprazole (PROTONIX) 40 MG tablet Take 1 tablet (40 mg total) by mouth daily. Patient not taking: Reported on 10/26/2016 09/04/16   Stark Klein, MD    Family History Family History  Problem Relation Age of Onset  . Heart attack Mother     Social History Social History  Substance Use Topics  .  Smoking status: Former Smoker    Packs/day: 1.00    Years: 15.00    Quit date: 08/12/1995  . Smokeless tobacco: Never Used  . Alcohol use Yes     Comment: he used to drink 6 pack beer a day for 40 years, quit in 02/2016      Allergies   No known allergies   Review of Systems Review of Systems  Gastrointestinal: Positive for abdominal pain.  Neurological: Positive for headaches.   Ten systems reviewed and are negative for acute change, except as noted in the HPI.    Physical Exam Updated Vital Signs BP 116/79   Pulse 91   Temp 98 F (36.7 C) (Oral)   Resp 18   SpO2 100%   Physical Exam  Constitutional: He is oriented to person, place, and time. He appears well-developed and well-nourished. No distress.  HENT:  Head: Normocephalic and atraumatic.  Mouth/Throat: Oropharynx is clear  and moist.  Eyes: Conjunctivae and EOM are normal. Pupils are equal, round, and reactive to light. No scleral icterus.  No horizontal, vertical or rotational nystagmus  Neck: Normal range of motion. Neck supple.    Full active and passive ROM without pain No midline or paraspinal tenderness No nuchal rigidity or meningeal signs TTP in the region specified. tissue is spastic.  Cardiovascular: Normal rate, regular rhythm, normal heart sounds and intact distal pulses.   Pulmonary/Chest: Effort normal and breath sounds normal. No respiratory distress. He has no wheezes. He has no rales.  Abdominal: Soft. Bowel sounds are normal. There is tenderness (diffuse). There is no rebound and no guarding.  Musculoskeletal: Normal range of motion. He exhibits no edema.  Lymphadenopathy:    He has no cervical adenopathy.  Neurological: He is alert and oriented to person, place, and time. No cranial nerve deficit. He exhibits normal muscle tone. Coordination normal.  Mental Status:  Alert, oriented, thought content appropriate. Speech fluent without evidence of aphasia. Able to follow 2 step commands without  difficulty.  Cranial Nerves:  II:  Peripheral visual fields grossly normal, pupils equal, round, reactive to light III,IV, VI: ptosis not present, extra-ocular motions intact bilaterally  V,VII: smile symmetric, facial light touch sensation equal VIII: hearing grossly normal bilaterally  IX,X: midline uvula rise  XI: bilateral shoulder shrug equal and strong XII: midline tongue extension  Motor:  5/5 in upper and lower extremities bilaterally including strong and equal grip strength and dorsiflexion/plantar flexion Sensory: Pinprick and light touch normal in all extremities.  Cerebellar: normal finger-to-nose with bilateral upper extremities Gait: normal gait and balance CV: distal pulses palpable throughout   Skin: Skin is warm and dry. No rash noted. He is not diaphoretic.  Psychiatric: He has a normal mood and affect. His behavior is normal. Judgment and thought content normal.  Nursing note and vitals reviewed.    ED Treatments / Results  Labs (all labs ordered are listed, but only abnormal results are displayed) Labs Reviewed  LIPASE, BLOOD - Abnormal; Notable for the following:       Result Value   Lipase <10 (*)    All other components within normal limits  COMPREHENSIVE METABOLIC PANEL - Abnormal; Notable for the following:    Glucose, Bld 178 (*)    All other components within normal limits  CBC - Abnormal; Notable for the following:    Hemoglobin 11.2 (*)    HCT 34.1 (*)    MCH 25.9 (*)    All other components within normal limits  URINALYSIS, ROUTINE W REFLEX MICROSCOPIC - Abnormal; Notable for the following:    APPearance HAZY (*)    All other components within normal limits    EKG  EKG Interpretation None       Radiology No results found.  Procedures Procedures (including critical care time)  Medications Ordered in ED Medications  HYDROmorphone (DILAUDID) injection 1 mg (1 mg Intravenous Given 10/26/16 0449)  LORazepam (ATIVAN) injection 0.5 mg  (0.5 mg Intravenous Given 10/26/16 0446)     Initial Impression / Assessment and Plan / ED Course  I have reviewed the triage vital signs and the nursing notes.  Pertinent labs & imaging results that were available during my care of the patient were reviewed by me and considered in my medical decision making (see chart for details).     Patient labs reviewed and are reassuring. I doubt any emergent cause of his abdominal pain as it is chronic. I  doubt bowel obstruction. Patient is without pain. On repeat abdominal examination. Patient also now resting comfortably after pain medications. I doubt emergent cause of his neck pain or headache such as meningitis. Patient is afebrile and hemodynamically stable. He has pain medications and a muscle relaxer at home and feels comfortable going home to take these medications. He appears safe for discharge at this time and may follow-up with his PCP or oncologist. Discussed return precautions  Final Clinical Impressions(s) / ED Diagnoses   Final diagnoses:  Generalized abdominal pain  Neck muscle spasm  Bad headache    New Prescriptions New Prescriptions   No medications on file     Margarita Mail, PA-C 93/23/55 [id number]    Delora Fuel, MD 02/54/27 0623

## 2016-10-26 NOTE — ED Notes (Signed)
Pt unable to provide a urine specimen at this time. Pt instructed to use call bell when able.

## 2016-10-29 ENCOUNTER — Telehealth: Payer: Self-pay | Admitting: *Deleted

## 2016-10-29 NOTE — Telephone Encounter (Signed)
Oral Chemotherapy Pharmacist Encounter  Received notification from [facility] that first Xeloda prescription will be delivered to patient's home tomorrow (10/30/16). Pharmacy ph: [phone] Pharmacy fax: [phone]  Johny Drilling, PharmD, BCPS, BCOP 10/29/2016  2:49 PM Oral Oncology Clinic [phone]

## 2016-10-29 NOTE — Telephone Encounter (Signed)
Thanks Denyse Amass.  [facility], please call pt and let him start on 1000mg  bid when he receives it, thanks.   Truitt Merle MD

## 2016-10-29 NOTE — Telephone Encounter (Signed)
Late Entry:  Pt called yest & reported rash on shoulders, back, & underarms. He reports small bumps that he can feel but can't really see.  He is not on xeloda.  He was sick on Sat after chemo & had neck spasms on Fri & went to Ed on Sun & was given robaxin.  He noticed the rash 10/27/16.  He received gemzar on 10/24/16.  He has been taking benadryl every 6 hours as instructed & today reports that he still itches & rash may be spreading.  He cannot tell me what it looks like.  He has an appt on Fri but states he would like someone to look at this rash tomorrow.  Per Dr Burr Medico, LOS sent to schedule with Ned Card NP or Charlestine Massed for tomorrow.

## 2016-10-30 ENCOUNTER — Telehealth: Payer: Self-pay | Admitting: Pharmacist

## 2016-10-30 ENCOUNTER — Ambulatory Visit (HOSPITAL_BASED_OUTPATIENT_CLINIC_OR_DEPARTMENT_OTHER): Payer: BLUE CROSS/BLUE SHIELD | Admitting: Adult Health

## 2016-10-30 ENCOUNTER — Telehealth: Payer: Self-pay | Admitting: Hematology

## 2016-10-30 VITALS — BP 132/84 | HR 84 | Temp 97.9°F | Resp 18 | Ht 71.0 in | Wt 161.1 lb

## 2016-10-30 DIAGNOSIS — E119 Type 2 diabetes mellitus without complications: Secondary | ICD-10-CM

## 2016-10-30 DIAGNOSIS — E46 Unspecified protein-calorie malnutrition: Secondary | ICD-10-CM | POA: Diagnosis not present

## 2016-10-30 DIAGNOSIS — R21 Rash and other nonspecific skin eruption: Secondary | ICD-10-CM | POA: Diagnosis not present

## 2016-10-30 DIAGNOSIS — C259 Malignant neoplasm of pancreas, unspecified: Secondary | ICD-10-CM

## 2016-10-30 DIAGNOSIS — I10 Essential (primary) hypertension: Secondary | ICD-10-CM

## 2016-10-30 DIAGNOSIS — R634 Abnormal weight loss: Secondary | ICD-10-CM

## 2016-10-30 DIAGNOSIS — F329 Major depressive disorder, single episode, unspecified: Secondary | ICD-10-CM | POA: Diagnosis not present

## 2016-10-30 DIAGNOSIS — C250 Malignant neoplasm of head of pancreas: Secondary | ICD-10-CM | POA: Diagnosis not present

## 2016-10-30 MED ORDER — BETAMETHASONE DIPROPIONATE 0.05 % EX CREA: TOPICAL_CREAM | Freq: Two times a day (BID) | CUTANEOUS | 0 refills | Status: AC

## 2016-10-30 MED ORDER — PREDNISONE 10 MG (21) PO TBPK: ORAL_TABLET | ORAL | 0 refills | Status: DC

## 2016-10-30 NOTE — Progress Notes (Signed)
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 Question loss of fat plane between the posterior aspect of mass and IVC. Mildly dilated CBD 10 mm.      03/13/2016 Procedure    ERCP and medical stem placement in CBD      03/20/2016 Initial Diagnosis    Primary pancreatic adenocarcinoma ([place])      03/20/2016 Initial Biopsy    FNA of the pancreatic head mass from EUS showed a malignant cells consistent with adenocarcinoma      03/20/2016 Procedure    EUS showed an irregular mass in the pancreatic head, measuring 3.9 x 3.2 cm, no invading of the celiac trunk or portal vein. The mass was biopsied.       04/09/2016 -  Neo-Adjuvant Chemotherapy    Gemcitabine and Abraxane on Day 1, 8 every 21 days       06/05/2016 Imaging    CT CAP w Contrast 06/05/16 IMPRESSION: Mildly decreased size of mass within pancreatic head and uncinateprocess, with common bile duct stent in appropriate position. No evidence of metastatic disease or other acute findings within thechest, abdomen, or  pelvis.      08/21/2016 Surgery    Whipple surgery by Dr. Barry Dienes       08/21/2016 Pathology Results    Whipple surgery showed ampullary invasive adenocarcinoma, moderately differentiated, 2.8 cm, tumor invades peripancreatic tissue and pancreas more than 0.5 cm, resection margins are negative, metastatic carcinoma in 1 of 16 lymph nodes. Biopsy of the liver and portal lymph nodes are negative.       08/31/2016 Imaging    CT CAP w Contrast 08/31/16 IMPRESSION: 1. Status post classic appearing Whipple procedure by dehiscence of suture sites. Marked gastric distention with fluid and food may reflect delayed gastric emptying. Fluid-filled jejunum has the appearance of postoperative ileus. No definite transition site is identified. 2. No sterile fluid collection or abscess. There are mottled gas like lucencies in the gallbladder fossa which may represent postop change.      HISTORY OF PRESENTING ILLNESS:  Craig [name] 61 y.o. male is here because of His newly diagnosed pancreatic cancer. He is coming up by his wife to our multidisciplinary check clinic today.  He has had epigastric pain for the last 4-6 weeks. He describes the pain is intermittent, last a few hours and a few times a day, located in the low mid chest and upper abdomen, not related to eating or position. He denies nausea, cough, or dyspnea. His appetite has decreased lately, and he complains about moderate fatigue, he was not able  to tolerate his physically demanding job, and he has been out of week for 3 weeks. He lost about 30-40 lbs in the past 2 months.   He was seen by his primary care physician a few times, and he developed jaundice, and abnormal liver function. He was sent to Hospital by his primary care physician. During his hospital stay, CT scan reviewed a pancreatic head mass, and mild dilatation of bile duct. He was seen by GI Dr. Jacelyn Grip, underwent ERCP and metal stent placement in CBD. He subsequently underwent EUS  which showed an irregular mass in the pancreatic head, measuring 3.9 cm, no invading of the celiac trunk or portal vein. The mass was biopsied, which showed adenocarcinoma.  He is married, lives with his wife. His abdominal pain, fatigue has not changed much since the CBD stent placement. He is able to tolerate routine activities, no other new complaints. He has been drinking beer moderately for the past 40 years, but stopped a few weeks ago.   CURRENT THERAPY: adjuvant chemotherapy with Gemcitabine and Xeloda, cycle 1 day 7  INTERIM HISTORY: [place] is here with c/o rash.  The rash began Saturday following his treatment with Gemcitabine.  The rash is not painful, but very itchy.  He is taking Benadryl, and it has helped some, however it makes him very tired.  He hasn't changed any lotions, soaps, detergent, household items.  He denies any new medications.  No one in his family or living at his home has this rash.    MEDICAL HISTORY:  Past Medical History:  Diagnosis Date  . Diabetes mellitus without complication ([place])   . ETOH abuse   . Hypertension    off bp meds 3 4- months ago  . Primary pancreatic adenocarcinoma ([place]) 03/28/2016  . Type 2 diabetes mellitus ([place])     SURGICAL HISTORY: Past Surgical History:  Procedure Laterality Date  . ERCP Left 03/13/2016   Procedure: ENDOSCOPIC RETROGRADE CHOLANGIOPANCREATOGRAPHY (ERCP);  Surgeon: Carol Ada, MD;  Location[facility] ENDOSCOPY;  Service: Endoscopy;  Laterality: Left;  . EUS N/A 03/20/2016   Procedure: UPPER ENDOSCOPIC ULTRASOUND (EUS) LINEAR;  Surgeon: Carol Ada, MD;  Location: [facility] ENDOSCOPY;  Service: Endoscopy;  Laterality: N/A;  . HERNIA REPAIR    . KNEE ARTHROSCOPY     left and right  . LAPAROSCOPY N/A 08/21/2016   Procedure: LAPAROSCOPY DIAGNOSTIC;  Surgeon: Stark Klein, MD;  Location: [facility] ORS;  Service: General;  Laterality: N/A;  . PORTACATH PLACEMENT N/A 04/07/2016   Procedure: INSERTION PORT-A-CATH;  Surgeon: Stark Klein, MD;   Location: [place];  Service: General;  Laterality: N/A;  . WHIPPLE PROCEDURE N/A 08/21/2016   Procedure: WHIPPLE PROCEDURE;  Surgeon: Stark Klein, MD;  Location: [facility] ORS;  Service: General;  Laterality: N/A;    SOCIAL HISTORY: Social History   Social History  . Marital status: Married    Spouse name: N/A  . Number of children: N/A  . Years of education: N/A   Occupational History  . Not on file.   Social History Main Topics  . Smoking status: Former Smoker    Packs/day: 1.00    Years: 15.00    Quit date: 08/12/1995  . Smokeless tobacco: Never Used  . Alcohol use Yes     Comment: he used to drink 6 pack beer a day for 40 years, quit in 02/2016   . Drug use: No  . Sexual activity: Not on file   Other Topics Concern  . Not on file  Social History Narrative  . No narrative on file    FAMILY HISTORY: Family History  Problem Relation Age of Onset  . Heart attack Mother     ALLERGIES:  is allergic to no known allergies.  MEDICATIONS:  Current Outpatient Prescriptions  Medication Sig Dispense Refill  . bisacodyl (DULCOLAX) 10 MG suppository Place 1 suppository (10 mg total) rectally daily as needed for moderate constipation. 12 suppository 0  . capecitabine (XELODA) 500 MG tablet Take 3 tablets (1,500 mg total) by mouth 2 (two) times daily after a meal. For 21 days on, 7 days off 017 tablet 1  . folic acid (FOLVITE) 1 MG tablet TAKE ONE TABLET BY MOUTH DAILY 30 tablet 0  . Insulin Glargine (LANTUS SOLOSTAR) 100 UNIT/ML Solostar Pen Inject 16 Units into the skin every morning. 15 mL 11  . lidocaine-prilocaine (EMLA) cream Apply to affected area once 30 g 3  . metoCLOPramide (REGLAN) 10 MG tablet Take 1 tablet (10 mg total) by mouth 4 (four) times daily. 120 tablet 3  . metoprolol tartrate (LOPRESSOR) 25 MG tablet Take 0.5 tablets (12.5 mg total) by mouth 2 (two) times daily. (Patient taking differently: Take 12.5 mg by mouth daily. ) 60 tablet 1  . mirtazapine (REMERON) 15 MG  tablet Take 1 tablet (15 mg total) by mouth at bedtime. 30 tablet 1  . Nutritional Supplements (FEEDING SUPPLEMENT, VITAL AF 1.2 CAL,) LIQD Place 1,000 mLs into feeding tube continuous. 5000 mL 12  . Pancrelipase, Lip-Prot-Amyl, (ZENPEP PO) Take 2 capsules by mouth 3 (three) times daily.    . potassium chloride SA (K-DUR,KLOR-CON) 20 MEQ tablet Take 1 tablet (20 mEq total) by mouth daily. 30 tablet 1  . simethicone (MYLICON) 80 MG chewable tablet Chew 1 tablet (80 mg total) by mouth 3 (three) times daily. 30 tablet 0  . thiamine 100 MG tablet Take 1 tablet (100 mg total) by mouth daily. 30 tablet 0  . betamethasone dipropionate (DIPROLENE) 0.05 % cream Apply topically 2 (two) times daily. 30 g 0  . LORazepam (ATIVAN) 1 MG tablet Take 0.5-1 tablets (0.5-1 mg total) by mouth 3 (three) times daily as needed for anxiety. (Patient not taking: Reported on 10/30/2016) 15 tablet 0  . methocarbamol (ROBAXIN) 500 MG tablet Take 1 tablet (500 mg total) by mouth every 8 (eight) hours as needed for muscle spasms. (Patient not taking: Reported on 10/26/2016) 90 tablet 2  . ondansetron (ZOFRAN) 8 MG tablet Take 1 tablet (8 mg total) by mouth every 8 (eight) hours as needed (Nausea or vomiting). (Patient not taking: Reported on 10/30/2016) 30 tablet 1  . oxyCODONE (OXY IR/ROXICODONE) 5 MG immediate release tablet Take 1-2 tablets (5-10 mg total) by mouth every 4 (four) hours as needed for moderate pain, severe pain or breakthrough pain. (Patient not taking: Reported on 10/30/2016) 60 tablet 0  . pantoprazole (PROTONIX) 40 MG tablet Take 1 tablet (40 mg total) by mouth daily. (Patient not taking: Reported on 10/26/2016) 30 tablet 3  . predniSONE (STERAPRED UNI-PAK 21 TAB) 10 MG (21) TBPK tablet Taper 6,5,4,3,2,1 21 tablet 0  . prochlorperazine (COMPAZINE) 10 MG tablet Take 1 tablet (10 mg total) by mouth every 8 (eight) hours as needed (Nausea or vomiting). (Patient not taking: Reported on 10/30/2016) 30 tablet 1   No  current facility-administered medications for this visit.     REVIEW OF SYSTEMS:   Review of Systems  Constitutional: Negative for chills, fever, malaise/fatigue and weight loss.  HENT: Negative for ear  pain, hearing loss and tinnitus.   Eyes: Negative for blurred vision and double vision.  Respiratory: Negative for cough and shortness of breath.   Cardiovascular: Negative for chest pain, palpitations and leg swelling.  Gastrointestinal: Negative for abdominal pain, blood in stool, constipation, diarrhea, heartburn, melena, nausea and vomiting.  Skin: Positive for itching and rash.  Neurological: Negative for dizziness, weakness and headaches.  Psychiatric/Behavioral: Negative for depression. The patient is not nervous/anxious.     PHYSICAL EXAMINATION: ECOG PERFORMANCE STATUS: 1 - Symptomatic but completely ambulatory   Vitals:   10/30/16 1125  BP: 132/84  Pulse: 84  Resp: 18  Temp: 97.9 F (36.6 C)  TempSrc: Oral  SpO2: 100%  Weight: 161 lb 1.6 oz (73.1 kg)  Height: 5\' 11"  (1.803 m)    GENERAL: Patient is a well appearing male in no acute distress HEENT:  Sclerae anicteric.  PERRL  Oropharynx clear and moist. No ulcerations or evidence of oropharyngeal candidiasis. Neck is supple.  NODES:  No cervical, supraclavicular, or axillary lymphadenopathy palpated.  LUNGS:  Clear to auscultation bilaterally.  No wheezes or rhonchi. HEART:  Regular rate and rhythm. No murmur appreciated. ABDOMEN:  Soft, nontender.  Positive, normoactive bowel sounds. No organomegaly palpated. MSK:  No focal spinal tenderness to palpation.  EXTREMITIES:  No peripheral edema.   SKIN:  Fine erythematous confluent maculopapular rash on anterior chest wall, near bilateral axilla, and extending down around into abdomen bilaterally, noted on right posterior upper back.   NEURO:  Nonfocal. Well oriented.  Appropriate affect.    LABORATORY DATA:  I have reviewed the data as listed CBC Latest Ref Rng &  Units 10/26/2016 10/24/2016 10/16/2016  WBC 4.0 - 10.5 K/uL 5.7 4.0 5.1  Hemoglobin 13.0 - 17.0 g/dL 11.2(L) 11.6(L) 10.9(L)  Hematocrit 39.0 - 52.0 % 34.1(L) 36.2(L) 33.9(L)  Platelets 150 - 400 K/uL 220 241 194   CMP Latest Ref Rng & Units 10/26/2016 10/24/2016 10/16/2016  Glucose 65 - 99 mg/dL 178(H) 116 88  BUN 6 - 20 mg/dL 8 6.2(L) 8.7  Creatinine 0.61 - 1.24 mg/dL 0.80 0.8 0.8  Sodium 135 - 145 mmol/L 136 142 141  Potassium 3.5 - 5.1 mmol/L 4.1 3.9 4.0  Chloride 101 - 111 mmol/L 103 - -  CO2 22 - 32 mmol/L 25 27 28   Calcium 8.9 - 10.3 mg/dL 9.2 9.5 9.7  Total Protein 6.5 - 8.1 g/dL 6.8 6.6 6.6  Total Bilirubin 0.3 - 1.2 mg/dL 0.8 0.51 0.41  Alkaline Phos 38 - 126 U/L 76 94 93  AST 15 - 41 U/L 20 22 18   ALT 17 - 63 U/L 22 33 23   PATHOLOGY REPORT   Diagnosis 08/21/16 1. Liver, biopsy - BENIGN VASCULAR PROLIFERATION. - NO MALIGNANCY IDENTIFIED. 2. Whipple procedure/resection - AMPULLARY INVASIVE ADENOCARCINOMA, MODERATELY DIFFERENTIATED, SPANNING 2.8 CM. - TUMOR INVADES PERIPANCREATIC TISSUE AND PANCREAS >0.5 CM. - RESECTION MARGINS ARE NEGATIVE. - METASTATIC CARCINOMA IN ONE OF SIXTEEN LYMPH NODES (1/16). - CHRONIC GASTRITIS. - CHRONIC CHOLECYSTITIS. - SEE ONCOLOGY TABLE. 3. Lymph node, biopsy, portal - ONE OF ONE LYMPH NODE NEGATIVE FOR CARCINOMA (0/1). - LIPOGRANULOMATOUS CHANGE. 4. Lymph node, biopsy, common hepatic - ONE OF ONE LYMPH NODE NEGATIVE FOR CARCINOMA (0/1). - LIPOGRANULOMATOUS CHANGE. 5. Lymph node, biopsy, Peripancreatic - ONE OF ONE LYMPH NODE NEGATIVE FOR CARCINOMA (0/1).  Diagnosis 03/20/2016 FINE NEEDLE ASPIRATION, ENDOSCOPIC, PANCREAS HEAD (SPECIMEN 1 OF 1 COLLECTED 03/20/16): MALIGNANT CELLS CONSISTENT WITH ADENOCARCINOMA. Preliminary Diagnosis Intraoperative Diagnosis: Adequate. ([facility])  Diagnosis 03/13/2016  Duodenum, Biopsy, Duodenal mass - BENIGN ULCERATED AND INFLAMED SMALL BOWEL-TYPE MUCOSA. - THERE IS NO EVIDENCE OF MALIGNANCY. - SEE  COMMENT.  RADIOGRAPHIC STUDIES: I have personally reviewed the radiological images as listed and agreed with the findings in the report. No results found.   CT Abdomen Pelvis w/ Contrast 08/31/2016 IMPRESSION: 1. Status post classic appearing Whipple procedure by dehiscence of suture sites. Marked gastric distention with fluid and food may reflect delayed gastric emptying. Fluid-filled jejunum has the appearance of postoperative ileus. No definite transition site is identified. 2. No sterile fluid collection or abscess. There are mottled gas like lucencies in the gallbladder fossa which may represent postop change.  EUS Dr. Benson Norway  Endosonographic Finding 03/20/2016 Findings: An irregular mass was identified in the pancreatic head. The mass was hypoechoic. The mass measured 39 mm by 32 mm in maximal cross-sectional diameter. The outer margins were irregular. An intact interface was seen between the mass and the celiac trunk and portal vein suggesting a lack of invasion. Fine needle aspiration for cytology was performed. Color Doppler imaging was utilized prior to needle puncture to confirm a lack of significant vascular structures within the needle path. Five passes were made with the 25 gauge needle using a transduodenal approach. A stylet was used. A cytotechnologist was present to evaluate the adequacy of the specimen. The cellularity of the specimen was adequate. Final cytology results are pending. - A mass was identified in the pancreatic head. Tissue was obtained from this exam, and results are pending. However, the endosonographic appearance is highly suspicious for adenocarcinoma. Fine needle aspiration performed.  ERCP 03/13/2016 Impression: - One covered metal stent was placed into the common bile duct.  CT chest, abdomen, pelvis w contrast 06/05/2016 IMPRESSION: Mildly decreased size of mass within pancreatic head and uncinate process, with common bile duct stent in  appropriate position.  No evidence of metastatic disease or other acute findings within the chest, abdomen, or pelvis.  ASSESSMENT & PLAN:  61 y.o. African-American male, with past medical history of diabetes and hypertension, presented with epigastric pain, weight loss, and jaundice.  1. 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 -The patient underwent surgery with Dr. Barry Dienes on 08/21/2016 with Whipple procedure/resection of the pancreas, liver biopsy, and lymph node biopsy. Liver biopsy was benign. Whipple procedure revealed moderately differentiated ampullary invasive adenocarcinoma measuring 2.8 cm. The tumor invaded the peripancreatic tissue and the pancreas > 0.5 cm. The resection margins were negative. 1/19 biopsied lymph nodes were positive. -patient's pathology was previously discussed and high risk of recurrence after surgery disease locally advanced disease even after neoadjuvant therapy.  - adjuvant chemo was recommended with gemcitabine and Xeloda, to reduce his risk of recurrence. I will likely give him 2 more months (he had neoadjuvant chemo for 4 months). The benefit and risks were discussed with him. He agreed to proceed. Chemotherapy consent was obtained. -The goal of therapy is curative.  - Dr. Lisbeth Renshaw and does not recommend adjuvant radiation   -still awaiting xeloda  2. HTN and DM - he will continue medication and follow up with his primary care physician  -We again reviewed that his blood pressure and blood glucose will need to be monitored closely during the chemotherapy, and his medication may need to be adjusted  -His hypoglycemia has been better controlled lately. Diabetic diet and exercise has been reviewed   3. Malnutrition and weight loss -weight  stable this week -he has been seen by our dietitian Pamala Hurry and will  follow up. -We'll watch his weight and nutrition status closely during chemotherapy. -I encouraged him to eat 4-5 small meals a day.   4. Depression -The patient is depressed about his diagnosis. -We again discussed the patient speaking with social work and the chaplain for which he has their numbers. -He tried mirtazapine, but did not like it and has stopped .  5. Rash -? Due to Gemcitabine -Reviewed with Dr. Burr Medico, patient to forego chemotherapy on 10/31/2016 and see her on 11/03/2016 for labs, eval and possible Gemcitabine  Plan I prescribed Prednisone taper and betamethasone cream for the rash.  He will take these, in addition to the benadryl.  He will return on Monday for labs, eval by Dr. Burr Medico and discussion of future treatment.  Patient is in agreement with this plan and verbalized understanding.     All questions were answered. The patient knows to call the clinic with any problems, questions or concerns.  A total of (30) minutes of face-to-face time was spent with this patient with greater than 50% of that time in counseling and care-coordination.   Scot Dock, NP 10/30/2016

## 2016-10-30 NOTE — Telephone Encounter (Signed)
sw pt to confirm 3/22 appt at 1130 am with NP per LOS

## 2016-10-30 NOTE — Telephone Encounter (Signed)
Oral Chemotherapy Pharmacist Encounter  Received notification [place] that patient's wife had contacted pharmacy yesterday (10/29/16) and provided new prescription insurance coverage. Wife states that she does not have [employer] prescription coverage any longer and would not allow shipment of patient's Xeloda.  New prior authorization submitted on CoverMyMeds Key [id number] Status is pending, additional information is required as Xeloda is being prescribed outside of FDA labeling  MD has been notified about delay in receiving Xeloda due to new PA needed with prescription insurance change.  Oral Oncology Clinic will continue to follow.  Johny Drilling, PharmD, BCPS, BCOP 10/30/2016  3:53 PM Oral Oncology Clinic [phone]

## 2016-10-31 ENCOUNTER — Telehealth: Payer: Self-pay | Admitting: Hematology

## 2016-10-31 ENCOUNTER — Ambulatory Visit: Payer: BLUE CROSS/BLUE SHIELD

## 2016-10-31 ENCOUNTER — Encounter: Payer: BLUE CROSS/BLUE SHIELD | Admitting: Nutrition

## 2016-10-31 ENCOUNTER — Other Ambulatory Visit: Payer: BLUE CROSS/BLUE SHIELD

## 2016-10-31 ENCOUNTER — Ambulatory Visit: Payer: BLUE CROSS/BLUE SHIELD | Admitting: Hematology

## 2016-10-31 NOTE — Telephone Encounter (Signed)
Called pt & informed to let us know if he hears anything on his script & to start when he gets it.  He expressed understanding & will be in office on Monday.

## 2016-10-31 NOTE — Telephone Encounter (Signed)
Spoke with patient re 3/26 appointments. Ok to add inf per infusion supervisor.

## 2016-11-03 ENCOUNTER — Other Ambulatory Visit (HOSPITAL_BASED_OUTPATIENT_CLINIC_OR_DEPARTMENT_OTHER): Payer: BLUE CROSS/BLUE SHIELD

## 2016-11-03 ENCOUNTER — Ambulatory Visit (HOSPITAL_BASED_OUTPATIENT_CLINIC_OR_DEPARTMENT_OTHER): Payer: BLUE CROSS/BLUE SHIELD

## 2016-11-03 ENCOUNTER — Ambulatory Visit (HOSPITAL_BASED_OUTPATIENT_CLINIC_OR_DEPARTMENT_OTHER): Payer: BLUE CROSS/BLUE SHIELD | Admitting: Hematology

## 2016-11-03 ENCOUNTER — Encounter: Payer: Self-pay | Admitting: Hematology

## 2016-11-03 ENCOUNTER — Telehealth: Payer: Self-pay | Admitting: Hematology

## 2016-11-03 ENCOUNTER — Ambulatory Visit: Payer: BLUE CROSS/BLUE SHIELD

## 2016-11-03 VITALS — BP 153/63 | HR 76 | Temp 98.4°F | Resp 18 | Ht 71.0 in | Wt 163.6 lb

## 2016-11-03 DIAGNOSIS — Z5111 Encounter for antineoplastic chemotherapy: Secondary | ICD-10-CM | POA: Diagnosis not present

## 2016-11-03 DIAGNOSIS — C250 Malignant neoplasm of head of pancreas: Secondary | ICD-10-CM

## 2016-11-03 DIAGNOSIS — Z95828 Presence of other vascular implants and grafts: Secondary | ICD-10-CM

## 2016-11-03 DIAGNOSIS — C259 Malignant neoplasm of pancreas, unspecified: Secondary | ICD-10-CM

## 2016-11-03 DIAGNOSIS — E118 Type 2 diabetes mellitus with unspecified complications: Secondary | ICD-10-CM

## 2016-11-03 DIAGNOSIS — R634 Abnormal weight loss: Secondary | ICD-10-CM

## 2016-11-03 DIAGNOSIS — E46 Unspecified protein-calorie malnutrition: Secondary | ICD-10-CM

## 2016-11-03 LAB — COMPREHENSIVE METABOLIC PANEL
ALT: 33 U/L (ref 0–55)
ANION GAP: 7 meq/L (ref 3–11)
AST: 19 U/L (ref 5–34)
Albumin: 3.4 g/dL — ABNORMAL LOW (ref 3.5–5.0)
Alkaline Phosphatase: 95 U/L (ref 40–150)
BUN: 7 mg/dL (ref 7.0–26.0)
CALCIUM: 8.9 mg/dL (ref 8.4–10.4)
CHLORIDE: 107 meq/L (ref 98–109)
CO2: 27 meq/L (ref 22–29)
CREATININE: 0.8 mg/dL (ref 0.7–1.3)
Glucose: 206 mg/dl — ABNORMAL HIGH (ref 70–140)
POTASSIUM: 3.4 meq/L — AB (ref 3.5–5.1)
SODIUM: 141 meq/L (ref 136–145)
Total Bilirubin: 0.26 mg/dL (ref 0.20–1.20)
Total Protein: 6.1 g/dL — ABNORMAL LOW (ref 6.4–8.3)

## 2016-11-03 LAB — CBC WITH DIFFERENTIAL/PLATELET
BASO%: 0.2 % (ref 0.0–2.0)
Basophils Absolute: 0 10*3/uL (ref 0.0–0.1)
EOS%: 1.1 % (ref 0.0–7.0)
Eosinophils Absolute: 0.1 10*3/uL (ref 0.0–0.5)
HCT: 32.4 % — ABNORMAL LOW (ref 38.4–49.9)
HGB: 10.6 g/dL — ABNORMAL LOW (ref 13.0–17.1)
LYMPH%: 26.9 % (ref 14.0–49.0)
MCH: 25.9 pg — ABNORMAL LOW (ref 27.2–33.4)
MCHC: 32.7 g/dL (ref 32.0–36.0)
MCV: 79.2 fL — ABNORMAL LOW (ref 79.3–98.0)
MONO#: 0.6 10*3/uL (ref 0.1–0.9)
MONO%: 10.5 % (ref 0.0–14.0)
NEUT%: 61.3 % (ref 39.0–75.0)
NEUTROS ABS: 3.3 10*3/uL (ref 1.5–6.5)
PLATELETS: 144 10*3/uL (ref 140–400)
RBC: 4.09 10*6/uL — AB (ref 4.20–5.82)
RDW: 15.2 % — ABNORMAL HIGH (ref 11.0–14.6)
WBC: 5.3 10*3/uL (ref 4.0–10.3)
lymph#: 1.4 10*3/uL (ref 0.9–3.3)

## 2016-11-03 MED ORDER — SODIUM CHLORIDE 0.9 % IV SOLN
1000.0000 mg/m2 | Freq: Once | INTRAVENOUS | Status: AC
  Administered 2016-11-03: 1976 mg via INTRAVENOUS
  Filled 2016-11-03: qty 51.97

## 2016-11-03 MED ORDER — SODIUM CHLORIDE 0.9 % IJ SOLN
10.0000 mL | INTRAMUSCULAR | Status: DC | PRN
  Administered 2016-11-03: 10 mL via INTRAVENOUS
  Filled 2016-11-03: qty 10

## 2016-11-03 MED ORDER — HEPARIN SOD (PORK) LOCK FLUSH 100 UNIT/ML IV SOLN
500.0000 [IU] | Freq: Once | INTRAVENOUS | Status: AC | PRN
  Administered 2016-11-03: 500 [IU]
  Filled 2016-11-03: qty 5

## 2016-11-03 MED ORDER — DEXAMETHASONE SODIUM PHOSPHATE 10 MG/ML IJ SOLN
10.0000 mg | Freq: Once | INTRAMUSCULAR | Status: AC
  Administered 2016-11-03: 10 mg via INTRAVENOUS

## 2016-11-03 MED ORDER — PROCHLORPERAZINE MALEATE 10 MG PO TABS
10.0000 mg | ORAL_TABLET | Freq: Once | ORAL | Status: AC
  Administered 2016-11-03: 10 mg via ORAL

## 2016-11-03 MED ORDER — SODIUM CHLORIDE 0.9 % IV SOLN: 10.0000 mg | Freq: Once | INTRAVENOUS | Status: DC

## 2016-11-03 MED ORDER — SODIUM CHLORIDE 0.9 % IV SOLN
Freq: Once | INTRAVENOUS | Status: AC
  Administered 2016-11-03: 15:00:00 via INTRAVENOUS

## 2016-11-03 MED ORDER — DEXAMETHASONE SODIUM PHOSPHATE 10 MG/ML IJ SOLN
INTRAMUSCULAR | Status: AC
  Filled 2016-11-03: qty 1

## 2016-11-03 MED ORDER — SODIUM CHLORIDE 0.9% FLUSH
10.0000 mL | INTRAVENOUS | Status: DC | PRN
  Administered 2016-11-03: 10 mL
  Filled 2016-11-03: qty 10

## 2016-11-03 MED ORDER — PROCHLORPERAZINE MALEATE 10 MG PO TABS
ORAL_TABLET | ORAL | Status: AC
  Filled 2016-11-03: qty 1

## 2016-11-03 NOTE — Patient Instructions (Signed)
[facility] Discharge Instructions for Patients Receiving Chemotherapy  Today you received the following chemotherapy agents Gemzar  To help prevent nausea and vomiting after your treatment, we encourage you to take your nausea medication as prescribed   If you develop nausea and vomiting that is not controlled by your nausea medication, call the clinic.   BELOW ARE SYMPTOMS THAT SHOULD BE REPORTED IMMEDIATELY:  *FEVER GREATER THAN 100.5 F  *CHILLS WITH OR WITHOUT FEVER  NAUSEA AND VOMITING THAT IS NOT CONTROLLED WITH YOUR NAUSEA MEDICATION  *UNUSUAL SHORTNESS OF BREATH  *UNUSUAL BRUISING OR BLEEDING  TENDERNESS IN MOUTH AND THROAT WITH OR WITHOUT PRESENCE OF ULCERS  *URINARY PROBLEMS  *BOWEL PROBLEMS  UNUSUAL RASH Items with * indicate a potential emergency and should be followed up as soon as possible.  Feel free to call the clinic you have any questions or concerns. The clinic phone number is [phone].  Please show the CHEMO ALERT CARD at check-in to the Emergency Department and triage nurse.   

## 2016-11-03 NOTE — Patient Instructions (Signed)
Implanted Port Home Guide An implanted port is a type of central line that is placed under the skin. Central lines are used to provide IV access when treatment or nutrition needs to be given through a person's veins. Implanted ports are used for long-term IV access. An implanted port may be placed because:  You need IV medicine that would be irritating to the small veins in your hands or arms.  You need long-term IV medicines, such as antibiotics.  You need IV nutrition for a long period.  You need frequent blood draws for lab tests.  You need dialysis.  Implanted ports are usually placed in the chest area, but they can also be placed in the upper arm, the abdomen, or the leg. An implanted port has two main parts:  Reservoir. The reservoir is round and will appear as a small, raised area under your skin. The reservoir is the part where a needle is inserted to give medicines or draw blood.  Catheter. The catheter is a thin, flexible tube that extends from the reservoir. The catheter is placed into a large vein. Medicine that is inserted into the reservoir goes into the catheter and then into the vein.  How will I care for my incision site? Do not get the incision site wet. Bathe or shower as directed by your health care provider. How is my port accessed? Special steps must be taken to access the port:  Before the port is accessed, a numbing cream can be placed on the skin. This helps numb the skin over the port site.  Your health care provider uses a sterile technique to access the port. ? Your health care provider must put on a mask and sterile gloves. ? The skin over your port is cleaned carefully with an antiseptic and allowed to dry. ? The port is gently pinched between sterile gloves, and a needle is inserted into the port.  Only "non-coring" port needles should be used to access the port. Once the port is accessed, a blood return should be checked. This helps ensure that the port  is in the vein and is not clogged.  If your port needs to remain accessed for a constant infusion, a clear (transparent) bandage will be placed over the needle site. The bandage and needle will need to be changed every week, or as directed by your health care provider.  Keep the bandage covering the needle clean and dry. Do not get it wet. Follow your health care provider's instructions on how to take a shower or bath while the port is accessed.  If your port does not need to stay accessed, no bandage is needed over the port.  What is flushing? Flushing helps keep the port from getting clogged. Follow your health care provider's instructions on how and when to flush the port. Ports are usually flushed with saline solution or a medicine called heparin. The need for flushing will depend on how the port is used.  If the port is used for intermittent medicines or blood draws, the port will need to be flushed: ? After medicines have been given. ? After blood has been drawn. ? As part of routine maintenance.  If a constant infusion is running, the port may not need to be flushed.  How long will my port stay implanted? The port can stay in for as long as your health care provider thinks it is needed. When it is time for the port to come out, surgery will be   done to remove it. The procedure is similar to the one performed when the port was put in. When should I seek immediate medical care? When you have an implanted port, you should seek immediate medical care if:  You notice a bad smell coming from the incision site.  You have swelling, redness, or drainage at the incision site.  You have more swelling or pain at the port site or the surrounding area.  You have a fever that is not controlled with medicine.  This information is not intended to replace advice given to you by your health care provider. Make sure you discuss any questions you have with your health care provider. Document  Released: 07/28/2005 Document Revised: 01/03/2016 Document Reviewed: 04/04/2013 Elsevier Interactive Patient Education  2017 Elsevier Inc.  

## 2016-11-03 NOTE — Telephone Encounter (Signed)
Gave patient AVS and scheduled appts per 11/03/2016 los.

## 2016-11-03 NOTE — Progress Notes (Signed)
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 Question loss of fat plane between the posterior aspect of mass and IVC. Mildly dilated CBD 10 mm.      03/13/2016 Procedure    ERCP and medical stem placement in CBD      03/20/2016 Initial Diagnosis    Primary pancreatic adenocarcinoma ([place])      03/20/2016 Initial Biopsy    FNA of the pancreatic head mass from EUS showed a malignant cells consistent with adenocarcinoma      03/20/2016 Procedure    EUS showed an irregular mass in the pancreatic head, measuring 3.9 x 3.2 cm, no invading of the celiac trunk or portal vein. The mass was biopsied.       04/09/2016 -  Neo-Adjuvant Chemotherapy    Gemcitabine and Abraxane on Day 1, 8 every 21 days       06/05/2016 Imaging    CT CAP w Contrast 06/05/16 IMPRESSION: Mildly decreased size of mass within pancreatic head and uncinateprocess, with common bile duct stent in appropriate position. No evidence of metastatic disease or other acute findings within thechest, abdomen, or  pelvis.      08/21/2016 Surgery    Whipple surgery by Dr. Barry Dienes       08/21/2016 Pathology Results    Whipple surgery showed ampullary invasive adenocarcinoma, moderately differentiated, 2.8 cm, tumor invades peripancreatic tissue and pancreas more than 0.5 cm, resection margins are negative, metastatic carcinoma in 1 of 16 lymph nodes. Biopsy of the liver and portal lymph nodes are negative.       08/31/2016 Imaging    CT CAP w Contrast 08/31/16 IMPRESSION: 1. Status post classic appearing Whipple procedure by dehiscence of suture sites. Marked gastric distention with fluid and food may reflect delayed gastric emptying. Fluid-filled jejunum has the appearance of postoperative ileus. No definite transition site is identified. 2. No sterile fluid collection or abscess. There are mottled gas like lucencies in the gallbladder fossa which may represent postop change.      HISTORY OF PRESENTING ILLNESS:  Craig [name] 61 y.o. male is here because of His newly diagnosed pancreatic cancer. He is coming up by his wife to our multidisciplinary check clinic today.  He has had epigastric pain for the last 4-6 weeks. He describes the pain is intermittent, last a few hours and a few times a day, located in the low mid chest and upper abdomen, not related to eating or position. He denies nausea, cough, or dyspnea. His appetite has decreased lately, and he complains about moderate fatigue, he was not able  to tolerate his physically demanding job, and he has been out of week for 3 weeks. He lost about 30-40 lbs in the past 2 months.   He was seen by his primary care physician a few times, and he developed jaundice, and abnormal liver function. He was sent to Hospital by his primary care physician. During his hospital stay, CT scan reviewed a pancreatic head mass, and mild dilatation of bile duct. He was seen by GI Dr. Jacelyn Grip, underwent ERCP and metal stent placement in CBD. He subsequently underwent EUS  which showed an irregular mass in the pancreatic head, measuring 3.9 cm, no invading of the celiac trunk or portal vein. The mass was biopsied, which showed adenocarcinoma.  He is married, lives with his wife. His abdominal pain, fatigue has not changed much since the CBD stent placement. He is able to tolerate routine activities, no other new complaints. He has been drinking beer moderately for the past 40 years, but stopped a few weeks ago.   CURRENT THERAPY: adjuvant chemotherapy with Gemcitabine 1000mg /m2 on day 1, 8 every 21 days, started on 10/24/2016, pending Xeloda   INTERIM HISTORY: Mr. Tagliaferro returns for follow-up and treatment. He is doing well today. The steroids helped, and his skin rash is gone. The rash was on his entire trunk and shoulders. Other than the rash, he has been tolerating chemo well. He hasn't heard anything from his insurance company yet for his chemo pill. Denies any other concerns.   MEDICAL HISTORY:  Past Medical History:  Diagnosis Date  . Diabetes mellitus without complication ([place])   . ETOH abuse   . Hypertension    off bp meds 3 4- months ago  . Primary pancreatic adenocarcinoma ([place]) 03/28/2016  . Type 2 diabetes mellitus ([place])     SURGICAL HISTORY: Past Surgical History:  Procedure Laterality Date  . ERCP Left 03/13/2016   Procedure: ENDOSCOPIC RETROGRADE CHOLANGIOPANCREATOGRAPHY (ERCP);  Surgeon: Carol Ada, MD;  Location: [facility] ENDOSCOPY;  Service: Endoscopy;  Laterality: Left;  . EUS N/A 03/20/2016   Procedure: UPPER ENDOSCOPIC ULTRASOUND (EUS) LINEAR;  Surgeon: Carol Ada, MD;  Location: [facility] ENDOSCOPY;  Service: Endoscopy;  Laterality: N/A;  . HERNIA REPAIR    . KNEE ARTHROSCOPY     left and right  . LAPAROSCOPY N/A 08/21/2016   Procedure: LAPAROSCOPY DIAGNOSTIC;  Surgeon: Stark Klein, MD;  Location: [facility] ORS;  Service: General;  Laterality: N/A;  . PORTACATH PLACEMENT N/A 04/07/2016   Procedure: INSERTION PORT-A-CATH;  Surgeon: Stark Klein, MD;   Location: [place];  Service: General;  Laterality: N/A;  . WHIPPLE PROCEDURE N/A 08/21/2016   Procedure: WHIPPLE PROCEDURE;  Surgeon: Stark Klein, MD;  Location: [facility] ORS;  Service: General;  Laterality: N/A;    SOCIAL HISTORY: Social History   Social History  . Marital status: Married    Spouse name: N/A  . Number of children: N/A  . Years of education: N/A   Occupational History  . Not on file.   Social History Main Topics  . Smoking status: Former Smoker    Packs/day: 1.00    Years: 15.00    Quit date: 08/12/1995  . Smokeless tobacco: Never Used  . Alcohol use Yes     Comment: he used to drink 6 pack beer a day for 40 years, quit in 02/2016   . Drug use: No  . Sexual activity: Not on file   Other Topics Concern  . Not on file   Social History Narrative  . No  narrative on file    FAMILY HISTORY: Family History  Problem Relation Age of Onset  . Heart attack Mother     ALLERGIES:  is allergic to no known allergies.  MEDICATIONS:  Current Outpatient Prescriptions  Medication Sig Dispense Refill  . betamethasone dipropionate (DIPROLENE) 0.05 % cream Apply topically 2 (two) times daily. 30 g 0  . bisacodyl (DULCOLAX) 10 MG suppository Place 1 suppository (10 mg total) rectally daily as needed for moderate constipation. 12 suppository 0  . folic acid (FOLVITE) 1 MG tablet TAKE ONE TABLET BY MOUTH DAILY 30 tablet 0  . Insulin Glargine (LANTUS SOLOSTAR) 100 UNIT/ML Solostar Pen Inject 16 Units into the skin every morning. 15 mL 11  . lidocaine-prilocaine (EMLA) cream Apply to affected area once 30 g 3  . LORazepam (ATIVAN) 1 MG tablet Take 0.5-1 tablets (0.5-1 mg total) by mouth 3 (three) times daily as needed for anxiety. 15 tablet 0  . methocarbamol (ROBAXIN) 500 MG tablet Take 1 tablet (500 mg total) by mouth every 8 (eight) hours as needed for muscle spasms. 90 tablet 2  . metoCLOPramide (REGLAN) 10 MG tablet Take 1 tablet (10 mg total) by mouth 4 (four) times daily. 120  tablet 3  . metoprolol tartrate (LOPRESSOR) 25 MG tablet Take 0.5 tablets (12.5 mg total) by mouth 2 (two) times daily. (Patient taking differently: Take 12.5 mg by mouth daily. ) 60 tablet 1  . mirtazapine (REMERON) 15 MG tablet Take 1 tablet (15 mg total) by mouth at bedtime. 30 tablet 1  . Nutritional Supplements (FEEDING SUPPLEMENT, VITAL AF 1.2 CAL,) LIQD Place 1,000 mLs into feeding tube continuous. 5000 mL 12  . ondansetron (ZOFRAN) 8 MG tablet Take 1 tablet (8 mg total) by mouth every 8 (eight) hours as needed (Nausea or vomiting). 30 tablet 1  . oxyCODONE (OXY IR/ROXICODONE) 5 MG immediate release tablet Take 1-2 tablets (5-10 mg total) by mouth every 4 (four) hours as needed for moderate pain, severe pain or breakthrough pain. 60 tablet 0  . Pancrelipase, Lip-Prot-Amyl, (ZENPEP PO) Take 2 capsules by mouth 3 (three) times daily.    . pantoprazole (PROTONIX) 40 MG tablet Take 1 tablet (40 mg total) by mouth daily. 30 tablet 3  . potassium chloride SA (K-DUR,KLOR-CON) 20 MEQ tablet Take 1 tablet (20 mEq total) by mouth daily. 30 tablet 1  . predniSONE (STERAPRED UNI-PAK 21 TAB) 10 MG (21) TBPK tablet Taper 6,5,4,3,2,1 21 tablet 0  . prochlorperazine (COMPAZINE) 10 MG tablet Take 1 tablet (10 mg total) by mouth every 8 (eight) hours as needed (Nausea or vomiting). 30 tablet 1  . simethicone (MYLICON) 80 MG chewable tablet Chew 1 tablet (80 mg total) by mouth 3 (three) times daily. 30 tablet 0  . thiamine 100 MG tablet Take 1 tablet (100 mg total) by mouth daily. 30 tablet 0  . capecitabine (XELODA) 500 MG tablet Take 3 tablets (1,500 mg total) by mouth 2 (two) times daily after a meal. For 21 days on, 7 days off (Patient not taking: Reported on 11/03/2016) 126 tablet 1   No current facility-administered medications for this visit.     REVIEW OF SYSTEMS:   Constitutional: Denies fevers, chills or abnormal night sweats (+) Poor appetite (+) weight loss, 6 lbs in 1 week Eyes: Denies blurriness  of vision, double vision or watery eyes Ears, nose, mouth, throat, and face: Denies mucositis or sore throat Respiratory: Denies cough, dyspnea or wheezes Cardiovascular: Denies palpitation, chest discomfort or lower extremity  swelling Gastrointestinal:  Denies nausea, heartburn or change in bowel habits Skin: Denies abnormal skin rashes Lymphatics: Denies new lymphadenopathy or easy bruising Neurological:Denies numbness, tingling or new weaknesses Behavioral/Psych: Mood is stable, no new changes  All other systems were reviewed with the patient and are negative.  PHYSICAL EXAMINATION: ECOG PERFORMANCE STATUS: 1 - Symptomatic but completely ambulatory   Vitals:   11/03/16 1358  BP: (!) 153/63  Pulse: 76  Resp: 18  Temp: 98.4 F (36.9 C)  TempSrc: Oral  SpO2: 100%  Weight: 163 lb 9.6 oz (74.2 kg)  Height: 5\' 11"  (1.803 m)    GENERAL:alert, no distress and comfortable. In treatment chair. SKIN: skin color, texture, turgor are normal, no rashes or significant lesions, Mild skin pigmentation so his trunk, from his previous skin rash last week EYES: normal, conjunctiva are pink and non-injected, sclera clear OROPHARYNX:no exudate, no erythema and lips, buccal mucosa, and tongue normal  NECK: supple, thyroid normal size, non-tender, without nodularity LYMPH:  no palpable lymphadenopathy in the cervical, axillary or inguinal LUNGS: clear to auscultation and percussion with normal breathing effort HEART: regular rate & rhythm and no murmurs and no lower extremity edema ABDOMEN: benign, soft, non-tender  Musculoskeletal:no cyanosis of digits and no clubbing  Extremities: no edema, PICC line has been removed  PSYCH: alert & oriented x 3 with fluent speech NEURO: no focal motor/sensory deficits  LABORATORY DATA:  I have reviewed the data as listed CBC Latest Ref Rng & Units 11/03/2016 10/26/2016 10/24/2016  WBC 4.0 - 10.3 10e3/uL 5.3 5.7 4.0  Hemoglobin 13.0 - 17.1 g/dL 10.6(L) 11.2(L)  11.6(L)  Hematocrit 38.4 - 49.9 % 32.4(L) 34.1(L) 36.2(L)  Platelets 140 - 400 10e3/uL 144 220 241   CMP Latest Ref Rng & Units 11/03/2016 10/26/2016 10/24/2016  Glucose 70 - 140 mg/dl 206(H) 178(H) 116  BUN 7.0 - 26.0 mg/dL 7.0 8 6.2(L)  Creatinine 0.7 - 1.3 mg/dL 0.8 0.80 0.8  Sodium 136 - 145 mEq/L 141 136 142  Potassium 3.5 - 5.1 mEq/L 3.4(L) 4.1 3.9  Chloride 101 - 111 mmol/L - 103 -  CO2 22 - 29 mEq/L 27 25 27   Calcium 8.4 - 10.4 mg/dL 8.9 9.2 9.5  Total Protein 6.4 - 8.3 g/dL 6.1(L) 6.8 6.6  Total Bilirubin 0.20 - 1.20 mg/dL 0.26 0.8 0.51  Alkaline Phos 40 - 150 U/L 95 76 94  AST 5 - 34 U/L 19 20 22   ALT 0 - 55 U/L 33 22 33   Results for AUM, CAGGIANO (MRN [id number]) as of 11/03/2016 14:31  Ref. Range 04/17/2016 13:35 04/24/2016 13:52 06/12/2016 08:50 07/18/2016 13:32 10/03/2016 10:35  CA 19-9 Latest Ref Range: 0 - 35 U/mL 177 (H) 94 (H) 38 (H) 61 (H) 17   PATHOLOGY REPORT   Diagnosis 08/21/16 1. Liver, biopsy - BENIGN VASCULAR PROLIFERATION. - NO MALIGNANCY IDENTIFIED. 2. Whipple procedure/resection - AMPULLARY INVASIVE ADENOCARCINOMA, MODERATELY DIFFERENTIATED, SPANNING 2.8 CM. - TUMOR INVADES PERIPANCREATIC TISSUE AND PANCREAS >0.5 CM. - RESECTION MARGINS ARE NEGATIVE. - METASTATIC CARCINOMA IN ONE OF SIXTEEN LYMPH NODES (1/16). - CHRONIC GASTRITIS. - CHRONIC CHOLECYSTITIS. - SEE ONCOLOGY TABLE. 3. Lymph node, biopsy, portal - ONE OF ONE LYMPH NODE NEGATIVE FOR CARCINOMA (0/1). - LIPOGRANULOMATOUS CHANGE. 4. Lymph node, biopsy, common hepatic - ONE OF ONE LYMPH NODE NEGATIVE FOR CARCINOMA (0/1). - LIPOGRANULOMATOUS CHANGE. 5. Lymph node, biopsy, Peripancreatic - ONE OF ONE LYMPH NODE NEGATIVE FOR CARCINOMA (0/1).  Diagnosis 03/20/2016 FINE NEEDLE ASPIRATION, ENDOSCOPIC, PANCREAS HEAD (SPECIMEN 1 OF  1 COLLECTED 03/20/16): MALIGNANT CELLS CONSISTENT WITH ADENOCARCINOMA. Preliminary Diagnosis Intraoperative Diagnosis: Adequate. ([facility])  Diagnosis 03/13/2016 Duodenum,  Biopsy, Duodenal mass - BENIGN ULCERATED AND INFLAMED SMALL BOWEL-TYPE MUCOSA. - THERE IS NO EVIDENCE OF MALIGNANCY. - SEE COMMENT.  RADIOGRAPHIC STUDIES: I have personally reviewed the radiological images as listed and agreed with the findings in the report. No results found.   CT Abdomen Pelvis w/ Contrast 08/31/2016 IMPRESSION: 1. Status post classic appearing Whipple procedure by dehiscence of suture sites. Marked gastric distention with fluid and food may reflect delayed gastric emptying. Fluid-filled jejunum has the appearance of postoperative ileus. No definite transition site is identified. 2. No sterile fluid collection or abscess. There are mottled gas like lucencies in the gallbladder fossa which may represent postop change.  EUS Dr. Benson Norway  Endosonographic Finding 03/20/2016 Findings: An irregular mass was identified in the pancreatic head. The mass was hypoechoic. The mass measured 39 mm by 32 mm in maximal cross-sectional diameter. The outer margins were irregular. An intact interface was seen between the mass and the celiac trunk and portal vein suggesting a lack of invasion. Fine needle aspiration for cytology was performed. Color Doppler imaging was utilized prior to needle puncture to confirm a lack of significant vascular structures within the needle path. Five passes were made with the 25 gauge needle using a transduodenal approach. A stylet was used. A cytotechnologist was present to evaluate the adequacy of the specimen. The cellularity of the specimen was adequate. Final cytology results are pending. - A mass was identified in the pancreatic head. Tissue was obtained from this exam, and results are pending. However, the endosonographic appearance is highly suspicious for adenocarcinoma. Fine needle aspiration performed.  ERCP 03/13/2016 Impression: - One covered metal stent was placed into the common bile duct.  CT chest, abdomen, pelvis w contrast  06/05/2016 IMPRESSION: Mildly decreased size of mass within pancreatic head and uncinate process, with common bile duct stent in appropriate position.  No evidence of metastatic disease or other acute findings within the chest, abdomen, or pelvis.  ASSESSMENT & PLAN:  61 y.o. African-American male, with past medical history of diabetes and hypertension, presented with epigastric pain, weight loss, and jaundice.  1. 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 -The patient underwent surgery with Dr. Barry Dienes on 08/21/2016 with Whipple procedure/resection of the pancreas, liver biopsy, and lymph node biopsy. Liver biopsy was benign. Whipple procedure revealed moderately differentiated ampullary invasive adenocarcinoma measuring 2.8 cm. The tumor invaded the peripancreatic tissue and the pancreas > 0.5 cm. The resection margins were negative. 1/19 biopsied lymph nodes were positive. -We previously discussed the patient's pathology and high risk of recurrence after surgery disease locally advanced disease even after neoadjuvant therapy.  -I recommend adjuvant chemo with gemcitabine and Xeloda, to reduce his risk of recurrence. I will likely give him 2 more months (he had neoadjuvant chemo for 4 months). The benefit and risks were discussed with him. He agrees to proceed. -The goal of therapy is curative.  -I previously discussed with radiation oncologist Dr. Lisbeth Renshaw and he does not recommend adjuvant radiation   -He has started weekly gemcitabine, developed skin rash after first dose, resolved with steroids. Possible related to gemcitabine, although he had 4 months of gemcitabine and Abraxane before surgery without any skin rash or other allergy reactions. -Lab reviewed, adequate for treatment, we'll proceed cycle 1 day 8 gemcitabine today, with premedication  dexamethasone 10 mg -He also has prednisone 5 mg 3 tablets at home, he will finish in the next few days  -Xeloda was finally approved by his insurance after appeal. However his wife request to use her insurance to cover the Xeloda. The insurance approval still pending. He understands this is causing delay of his treatment. He will call me when he receives it. Due to his weight loss, start on lower dose: 2 in the morning, 2 in the evening  2. HTN and DM - he will continue medication and follow up with his primary care physician  -We again reviewed that his blood pressure and blood glucose will need to be monitored closely during the chemotherapy, and his medication may need to be adjusted  -His sugar has been increasing due to the steroids. Still, fasting values not above 150-200. I encouraged him to check more frequently, and cover with short acting insulin.  3. Malnutrition and weight loss -He has lost 6 lbs in the past week -he was seen by our dietitian Pamala Hurry and will follow up. -We'll watch his weight and nutrition status closely when he is on chemotherapy. -He is on TPN and has a PEG tube. The patient states he is not using his PEG tube and is not taking Glucerna due to fear of spiking his sugar. -He only eats 2 small meals a day with 2 cans of Ensure. I encouraged him to eat more, or increase his Ensure intake. -He will talk to our nutritionist again.  -he is off TPN now  -I encouraged him to eat 4-5 small meals a day.   4. Depression -The patient is depressed about his diagnosis. -We again discussed the patient speaking with social work and the chaplain for which he has their numbers. -He tried mirtazapine, but did not like it and has stopped .  5. Skin Rash -Possibly related to gemcitabine  -On entire trunk and shoulders. Held chemo that week -Started on steroids. Rash resolved now    Plan -Lab reviewed, adequate for treatment, gemcitabine 1000 mg/m weekly for 2 weeks on, 1  week off, today. Add dexa 10mg  before infusion to prevent skin rash. Off next week. Treatment April 9.  -He will call me when he receives and starts Xeloda, will start him on 1000mg  BID -f/u in 2 weeks.    All questions were answered. The patient knows to call the clinic with any problems, questions or concerns.  I spent 20 minutes counseling the patient face to face. The total time spent in the appointment was 25 minutes and more than 50% was on counseling.  This document serves as a record of services personally performed by Truitt Merle, MD. It was created on her behalf by Martinique Casey, a trained medical scribe. The creation of this record is based on the scribe's personal observations and the provider's statements to them. This document has been checked and approved by the attending provider.  I have reviewed the above documentation for accuracy and completeness and I agree with the above.   Truitt Merle, MD 11/03/2016

## 2016-11-04 LAB — CANCER ANTIGEN 19-9: CAN 19-9: 84 U/mL — AB (ref 0–35)

## 2016-11-04 NOTE — Telephone Encounter (Signed)
Oral Chemotherapy Pharmacist Encounter  Additional clinical information provided to [place] in MD office note, letter of medical necessity, excerpt from NCCN guidelines recommending current treatment plan, and supporting clinical trial (ESPAC-4) referenced in NCCN guidelines. All information faxed to [phone]  Oral Oncology Clinic will continue to follow.  Johny Drilling, PharmD, BCPS, BCOP 11/04/2016  1:56 PM Oral Oncology Clinic [phone]

## 2016-11-06 ENCOUNTER — Other Ambulatory Visit: Payer: Self-pay | Admitting: Nurse Practitioner

## 2016-11-06 ENCOUNTER — Telehealth: Payer: Self-pay | Admitting: *Deleted

## 2016-11-06 DIAGNOSIS — R21 Rash and other nonspecific skin eruption: Secondary | ICD-10-CM

## 2016-11-06 MED ORDER — METHYLPREDNISOLONE 4 MG PO TBPK: ORAL_TABLET | ORAL | 0 refills | Status: DC

## 2016-11-06 NOTE — Telephone Encounter (Signed)
Oral Chemotherapy Pharmacist Encounter  Received notification from [facility] that prior authorization of patient Xeloda has been approved Effective dates 10/30/16-10/30/17 Ref# [id number]  I called [employer] at [phone] to alert them to process patient's prescription. They are still getting a rejected claim on patient's prescription. They will call the patient's plan to attempt to resolve issue and will reach out to the office if needed. I also faxed a copy of the PA approval to [place] at [phone].  Oral Oncology Clinic will continue to follow.  Johny Drilling, PharmD, BCPS, BCOP 11/06/2016  4:12 PM Oral Oncology Clinic [phone]

## 2016-11-06 NOTE — Telephone Encounter (Signed)
Patient called and stated,"I had chemotherapy on Monday, and today I have itching all over my body. I do not have a rash." Patient denied nausea, vomiting, diarrhea, fevers, and pain. Per Selena Lesser, NP, I instructed patient to take Benadryl 25 mg orally every six hours, Pepcid 20 mg orally every 12 hours, and Hydrocortisone cream, maximum strength and apply to his body. Selena Lesser, NP, to call in a Medrol Dose Pak. Patient instructed not to start taking the Medrol [facility] until he has a rash. Patient verbalized understanding of conversation.

## 2016-11-11 ENCOUNTER — Telehealth: Payer: Self-pay | Admitting: *Deleted

## 2016-11-11 NOTE — Telephone Encounter (Signed)
Received message from Pietro Cassis, RN case manager @ [facility] requesting update on pt's care plan.   Called Christy back and left direct fax number to nurses' desk for her to fax forms for Dr. Burr Medico to review. Christy's    Phone     [phone]     ;      Fax    [phone].

## 2016-11-11 NOTE — Progress Notes (Signed)
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 Question loss of fat plane between the posterior aspect of mass and IVC. Mildly dilated CBD 10 mm.      03/13/2016 Procedure    ERCP and medical stem placement in CBD      03/20/2016 Initial Diagnosis    Primary pancreatic adenocarcinoma (Craig [name])      03/20/2016 Initial Biopsy    FNA of the pancreatic head mass from EUS showed a malignant cells consistent with adenocarcinoma      03/20/2016 Procedure    EUS showed an irregular mass in the pancreatic head, measuring 3.9 x 3.2 cm, no invading of the celiac trunk or portal vein. The mass was biopsied.       04/09/2016 -  Neo-Adjuvant Chemotherapy    Gemcitabine and Abraxane on Day 1, 8 every 21 days       06/05/2016 Imaging    CT CAP w Contrast 06/05/16 IMPRESSION: Mildly decreased size of mass within pancreatic head and uncinateprocess, with common bile duct stent in appropriate position. No evidence of metastatic disease or other acute findings within thechest, abdomen, or  pelvis.      08/21/2016 Surgery    Whipple surgery by Dr. Barry [name]       08/21/2016 Pathology Results    Whipple surgery showed ampullary invasive adenocarcinoma, moderately differentiated, 2.8 cm, tumor invades peripancreatic tissue and pancreas more than 0.5 cm, resection margins are negative, metastatic carcinoma in 1 of 16 lymph nodes. Biopsy of the liver and portal lymph nodes are negative.       08/31/2016 Imaging    CT CAP w Contrast 08/31/16 IMPRESSION: 1. Status post classic appearing Whipple procedure by dehiscence of suture sites. Marked gastric distention with fluid and food may reflect delayed gastric emptying. Fluid-filled jejunum has the appearance of postoperative ileus. No definite transition site is identified. 2. No sterile fluid collection or abscess. There are mottled gas like lucencies in the gallbladder fossa which may represent postop change.      HISTORY OF PRESENTING ILLNESS:  Craig [name] 61 y.o. male is here because of His newly diagnosed pancreatic cancer. He is coming up by his wife to our multidisciplinary check clinic today.  He has had epigastric pain for the last 4-6 weeks. He describes the pain is intermittent, last a few hours and a few times a day, located in the low mid chest and upper abdomen, not related to eating or position. He denies nausea, cough, or dyspnea. His appetite has decreased lately, and he complains about moderate fatigue, he was not able  to tolerate his physically demanding job, and he has been out of week for 3 weeks. He lost about 30-40 lbs in the past 2 months.   He was seen by his primary care physician a few times, and he developed jaundice, and abnormal liver function. He was sent to Hospital by his primary care physician. During his hospital stay, CT scan reviewed a pancreatic head mass, and mild dilatation of bile duct. He was seen by GI Dr. Jacelyn [name], underwent ERCP and metal stent placement in CBD. He subsequently underwent EUS  which showed an irregular mass in the pancreatic head, measuring 3.9 cm, no invading of the celiac trunk or portal vein. The mass was biopsied, which showed adenocarcinoma.  He is married, lives with his wife. His abdominal pain, fatigue has not changed much since the CBD stent placement. He is able to tolerate routine activities, no other new complaints. He has been drinking beer moderately for the past 40 years, but stopped a few weeks ago.   CURRENT THERAPY: adjuvant chemotherapy with Gemcitabine 1000mg /m2 on day 1, 8 every 21 days, started on 10/24/2016, pending Xeloda   INTERIM HISTORY: Craig [name] returns for follow-up and treatment. He is doing well today. After his last treatment, he itched all over his body. Denies rash. It was constant for the first 3 days after chemo, but is still mildly present today. He still has a loss of appetite and has lost 2 more pounds. Denies any other concerns.   MEDICAL HISTORY:  Past Medical History:  Diagnosis Date  . Diabetes mellitus without complication (Craig [name] )   . ETOH abuse   . Hypertension    off bp meds 3 4- months ago  . Primary pancreatic adenocarcinoma (Craig [name]) 03/28/2016  . Type 2 diabetes mellitus (Craig [name])     SURGICAL HISTORY: Past Surgical History:  Procedure Laterality Date  . ERCP Left 03/13/2016   Procedure: ENDOSCOPIC RETROGRADE CHOLANGIOPANCREATOGRAPHY (ERCP);  Surgeon: Craig Ada, MD;  Location: [facility] ENDOSCOPY;  Service: Endoscopy;  Laterality: Left;  . EUS N/A 03/20/2016   Procedure: UPPER ENDOSCOPIC ULTRASOUND (EUS) LINEAR;  Surgeon: Craig Ada, MD;  Location: [facility] ENDOSCOPY;  Service: Endoscopy;  Laterality: N/A;  . HERNIA REPAIR    . KNEE ARTHROSCOPY     left and right  . LAPAROSCOPY N/A 08/21/2016   Procedure: LAPAROSCOPY DIAGNOSTIC;  Surgeon: Craig Klein, MD;  Location: [facility] ORS;  Service: General;  Laterality: N/A;  . PORTACATH PLACEMENT N/A 04/07/2016   Procedure: INSERTION PORT-A-CATH;  Surgeon: Craig Klein, MD;  Location: [facility];  Service: General;  Laterality: N/A;  . WHIPPLE PROCEDURE N/A 08/21/2016   Procedure: WHIPPLE PROCEDURE;  Surgeon: Craig Klein, MD;  Location: [facility] ORS;  Service: General;  Laterality: N/A;    SOCIAL HISTORY: Social History   Social History  . Marital status: Married    Spouse name: N/A  . Number of children: N/A  . Years of education: N/A   Occupational History  . Not on file.   Social History Main Topics  . Smoking status: Former Smoker    Packs/day: 1.00    Years: 15.00    Quit date: 08/12/1995  . Smokeless tobacco: Never Used  . Alcohol use Yes     Comment: he used to drink 6 pack beer a day for 40 years, quit in 02/2016   . Drug use: No  . Sexual activity: Not on file   Other Topics Concern  . Not on file   Social History Narrative  . No  narrative on file    FAMILY HISTORY: Family History  Problem Relation Age of Onset  . Heart attack Mother     ALLERGIES:  is allergic to no known allergies.  MEDICATIONS:  Current Outpatient Prescriptions  Medication Sig Dispense Refill  . betamethasone dipropionate (DIPROLENE) 0.05 % cream Apply topically 2 (two) times daily. 30 g 0  . bisacodyl (DULCOLAX) 10 MG suppository Place 1 suppository (10 mg total) rectally daily as needed for moderate constipation. 12 suppository 0  . folic acid (FOLVITE) 1 MG tablet TAKE ONE TABLET BY MOUTH DAILY 30 tablet 0  . Insulin Glargine (LANTUS SOLOSTAR) 100 UNIT/ML Solostar Pen Inject 16 Units into the skin every morning. 15 mL 11  . lidocaine-prilocaine (EMLA) cream Apply to affected area once 30 g 3  . LORazepam (ATIVAN) 1 MG tablet Take 0.5-1 tablets (0.5-1 mg total) by mouth 3 (three) times daily as needed for anxiety. 15 tablet 0  . methocarbamol (ROBAXIN) 500 MG tablet Take 1 tablet (500 mg total) by mouth every 8 (eight) hours as needed for muscle spasms. 90 tablet 2  . methylPREDNISolone (MEDROL DOSEPAK) 4 MG TBPK tablet Medrol dose pak: take as directed. 21 tablet 0  .  metoCLOPramide (REGLAN) 10 MG tablet Take 1 tablet (10 mg total) by mouth 4 (four) times daily. 120 tablet 3  . metoprolol tartrate (LOPRESSOR) 25 MG tablet Take 0.5 tablets (12.5 mg total) by mouth 2 (two) times daily. (Patient taking differently: Take 12.5 mg by mouth daily. ) 60 tablet 1  . mirtazapine (REMERON) 15 MG tablet Take 1 tablet (15 mg total) by mouth at bedtime. 30 tablet 1  . Nutritional Supplements (FEEDING SUPPLEMENT, VITAL AF 1.2 CAL,) LIQD Place 1,000 mLs into feeding tube continuous. 5000 mL 12  . ondansetron (ZOFRAN) 8 MG tablet Take 1 tablet (8 mg total) by mouth every 8 (eight) hours as needed (Nausea or vomiting). 30 tablet 1  . oxyCODONE (OXY IR/ROXICODONE) 5 MG immediate release tablet Take 1-2 tablets (5-10 mg total) by mouth every 4 (four) hours as needed for moderate pain, severe pain or breakthrough pain. 60 tablet 0  . Pancrelipase, Lip-Prot-Amyl, (ZENPEP PO) Take 2 capsules by mouth 3 (three) times daily.    . pantoprazole (PROTONIX) 40 MG tablet Take 1 tablet (40 mg total) by mouth daily. 30 tablet 3  . potassium chloride SA (K-DUR,KLOR-CON) 20 MEQ tablet Take 1 tablet (20 mEq total) by mouth daily. 30 tablet 1  . prochlorperazine (COMPAZINE) 10 MG tablet Take 1 tablet (10 mg total) by mouth every 8 (eight) hours as needed (Nausea or vomiting). 30 tablet 1  . simethicone (MYLICON) 80 MG chewable tablet Chew 1 tablet (80 mg total) by mouth 3 (three) times daily. 30 tablet 0  . thiamine 100 MG tablet Take 1 tablet (100 mg total) by mouth daily. 30 tablet 0  . capecitabine (XELODA) 500 MG tablet Take 3 tablets (1,500 mg total) by mouth 2 (two) times daily after a meal. For 14 days on, 7 days off (Patient not taking: Reported on 11/17/2016) 84 tablet 2  . dexamethasone (DECADRON) 2 MG tablet Take 1 tablet (2 mg total) by mouth 2 (two) times daily with a meal. 30 tablet 0   No current facility-administered medications for this visit.    REVIEW OF SYSTEMS:   Constitutional:  Denies fevers, chills or abnormal night sweats (+) loss of appetite (+) weight loss  Eyes: Denies blurriness of vision, double vision or watery eyes Ears, nose, mouth,  throat, and face: Denies mucositis or sore throat Respiratory: Denies cough, dyspnea or wheezes Cardiovascular: Denies palpitation, chest discomfort or lower extremity swelling Gastrointestinal:  Denies nausea, heartburn or change in bowel habits Skin: Denies abnormal skin rashes (+) itching, whole body Lymphatics: Denies new lymphadenopathy or easy bruising Neurological:Denies numbness, tingling or new weaknesses Behavioral/Psych: Mood is stable, no new changes  All other systems were reviewed with the patient and are negative.  PHYSICAL EXAMINATION: ECOG PERFORMANCE STATUS: 1 - Symptomatic but completely ambulatory  Vitals:   11/17/16 1356  BP: 130/73  Pulse: 82  Resp: 16  Temp: 98.9 F (37.2 C)  TempSrc: Oral  SpO2: 100%  Weight: 161 lb (73 kg)  Height: 5\' 11"  (1.803 m)    GENERAL:alert, no distress and comfortable. In treatment chair. SKIN: skin color, texture, turgor are normal, no rashes or significant lesions, Mild skin pigmentation so his trunk, from his previous skin rash last week EYES: normal, conjunctiva are pink and non-injected, sclera clear OROPHARYNX:no exudate, no erythema and lips, buccal mucosa, and tongue normal  NECK: supple, thyroid normal size, non-tender, without nodularity LYMPH:  no palpable lymphadenopathy in the cervical, axillary or inguinal LUNGS: clear to auscultation and percussion with normal breathing effort HEART: regular rate & rhythm and no murmurs and no lower extremity edema ABDOMEN: benign, soft, non-tender  Musculoskeletal:no cyanosis of digits and no clubbing  Extremities: no edema, PICC line has been removed  PSYCH: alert & oriented x 3 with fluent speech NEURO: no focal motor/sensory deficits  LABORATORY DATA:  I have reviewed the data as listed CBC Latest Ref Rng &  Units 11/17/2016 11/03/2016 10/26/2016  WBC 4.0 - 10.3 10e3/uL 3.6(L) 5.3 5.7  Hemoglobin 13.0 - 17.1 g/dL 10.9(L) 10.6(L) 11.2(L)  Hematocrit 38.4 - 49.9 % 34.0(L) 32.4(L) 34.1(L)  Platelets 140 - 400 10e3/uL 259 144 220   CMP Latest Ref Rng & Units 11/17/2016 11/03/2016 10/26/2016  Glucose 70 - 140 mg/dl 275(H) 206(H) 178(H)  BUN 7.0 - 26.0 mg/dL 6.1(L) 7.0 8  Creatinine 0.7 - 1.3 mg/dL 1.0 0.8 0.80  Sodium 136 - 145 mEq/L 141 141 136  Potassium 3.5 - 5.1 mEq/L 4.4 3.4(L) 4.1  Chloride 101 - 111 mmol/L - - 103  CO2 22 - 29 mEq/L 26 27 25   Calcium 8.4 - 10.4 mg/dL 8.7 8.9 9.2  Total Protein 6.4 - 8.3 g/dL 5.8(L) 6.1(L) 6.8  Total Bilirubin 0.20 - 1.20 mg/dL 0.34 0.26 0.8  Alkaline Phos 40 - 150 U/L 96 95 76  AST 5 - 34 U/L 27 19 20   ALT 0 - 55 U/L 33 33 22   Results for SAHIR, TOLSON (MRN [id number]) as of 11/17/2016 14:29  Ref. Range 04/24/2016 13:52 06/12/2016 08:50 07/18/2016 13:32 10/03/2016 10:35 11/03/2016 11:38  CA 19-9 Latest Ref Range: 0 - 35 U/mL 94 (H) 38 (H) 61 (H) 17 84 (H)   PATHOLOGY REPORT  Diagnosis 08/21/16 1. Liver, biopsy - BENIGN VASCULAR PROLIFERATION. - NO MALIGNANCY IDENTIFIED. 2. Whipple procedure/resection - AMPULLARY INVASIVE ADENOCARCINOMA, MODERATELY DIFFERENTIATED, SPANNING 2.8 CM. - TUMOR INVADES PERIPANCREATIC TISSUE AND PANCREAS >0.5 CM. - RESECTION MARGINS ARE NEGATIVE. - METASTATIC CARCINOMA IN ONE OF SIXTEEN LYMPH NODES (1/16). - CHRONIC GASTRITIS. - CHRONIC CHOLECYSTITIS. - SEE ONCOLOGY TABLE. 3. Lymph node, biopsy, portal - ONE OF ONE LYMPH NODE NEGATIVE FOR CARCINOMA (0/1). - LIPOGRANULOMATOUS CHANGE. 4. Lymph node, biopsy, common hepatic - ONE OF ONE LYMPH NODE NEGATIVE FOR CARCINOMA (0/1). - LIPOGRANULOMATOUS CHANGE. 5. Lymph node, biopsy, Peripancreatic -  ONE OF ONE LYMPH NODE NEGATIVE FOR CARCINOMA (0/1).  Diagnosis 03/20/2016 FINE NEEDLE ASPIRATION, ENDOSCOPIC, PANCREAS HEAD (SPECIMEN 1 OF 1 COLLECTED 03/20/16): MALIGNANT CELLS  CONSISTENT WITH ADENOCARCINOMA. Preliminary Diagnosis Intraoperative Diagnosis: Adequate. ([facility])  Diagnosis 03/13/2016 Duodenum, Biopsy, Duodenal mass - BENIGN ULCERATED AND INFLAMED SMALL BOWEL-TYPE MUCOSA. - THERE IS NO EVIDENCE OF MALIGNANCY. - SEE COMMENT.  RADIOGRAPHIC STUDIES: I have personally reviewed the radiological images as listed and agreed with the findings in the report.  CT Abdomen Pelvis w/ Contrast 08/31/2016 IMPRESSION: 1. Status post classic appearing Whipple procedure by dehiscence of suture sites. Marked gastric distention with fluid and food may reflect delayed gastric emptying. Fluid-filled jejunum has the appearance of postoperative ileus. No definite transition site is identified. 2. No sterile fluid collection or abscess. There are mottled gas like lucencies in the gallbladder fossa which may represent postop change.  EUS Dr. Benson Norway  Endosonographic Finding 03/20/2016 Findings: An irregular mass was identified in the pancreatic head. The mass was hypoechoic. The mass measured 39 mm by 32 mm in maximal cross-sectional diameter. The outer margins were irregular. An intact interface was seen between the mass and the celiac trunk and portal vein suggesting a lack of invasion. Fine needle aspiration for cytology was performed. Color Doppler imaging was utilized prior to needle puncture to confirm a lack of significant vascular structures within the needle path. Five passes were made with the 25 gauge needle using a transduodenal approach. A stylet was used. A cytotechnologist was present to evaluate the adequacy of the specimen. The cellularity of the specimen was adequate. Final cytology results are pending. - A mass was identified in the pancreatic head. Tissue was obtained from this exam, and results are pending. However, the endosonographic appearance is highly suspicious for adenocarcinoma. Fine needle aspiration performed.  ERCP 03/13/2016 Impression: - One  covered metal stent was placed into the common bile duct.  CT chest, abdomen, pelvis w contrast 06/05/2016 IMPRESSION: Mildly decreased size of mass within pancreatic head and uncinate process, with common bile duct stent in appropriate position.  No evidence of metastatic disease or other acute findings within the chest, abdomen, or pelvis.  ASSESSMENT & PLAN:  61 y.o. African-American male, with past medical history of diabetes and hypertension, presented with epigastric pain, weight loss, and jaundice.  1. 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 -The patient underwent surgery with Dr. Barry [name] on 08/21/2016 with Whipple procedure/resection of the pancreas, liver biopsy, and lymph node biopsy. Liver biopsy was benign. Whipple procedure revealed moderately differentiated ampullary invasive adenocarcinoma measuring 2.8 cm. The tumor invaded the peripancreatic tissue and the pancreas > 0.5 cm. The resection margins were negative. 1/19 biopsied lymph nodes were positive. -We previously discussed the patient's pathology and high risk of recurrence after surgery disease locally advanced disease even after neoadjuvant therapy.  -I recommend adjuvant chemo with gemcitabine and Xeloda, to reduce his risk of recurrence. I will likely give him 2 more months (he had neoadjuvant chemo for 4 months). The benefit and risks were discussed with him. He agrees to proceed. -The goal of therapy is curative.  -I previously discussed with radiation oncologist Dr. Lisbeth Renshaw and he does not recommend adjuvant radiation   -He has started weekly gemcitabine, developed skin rash after first dose, resolved with steroids. Possible related to gemcitabine, although he had 4 months of gemcitabine and Abraxane before surgery without any skin rash or other allergy  reactions. -Lab  reviewed, adequate for treatment, we'll proceed gemcitabine today, with premedication dexamethasone 10 mg, he will also take dexa 2mg  daily for 3-5 days after chemo to prevent skin rash  -He also has prednisone 5 mg 3 tablets at home, he will finish in the next few days  -Xeloda was finally approved by his insurance after appeal. However his wife request to use her insurance to cover the Xeloda. The insurance approval still pending. He understands this is causing delay of his treatment. He will call me when he receives it. Due to his weight loss, start on lower dose: 2 in the morning, 2 in the evening  2. HTN and DM - he will continue medication and follow up with his primary care physician  -We again reviewed that his blood pressure and blood glucose will need to be monitored closely during the chemotherapy, and his medication may need to be adjusted  -His sugar has been increasing due to the steroids. Still, fasting values not above 150-200. I encouraged him to check more frequently, and cover with short acting insulin.  3. Malnutrition and weight loss -He has lost 6 lbs in the past week -he was seen by our dietitian Pamala Hurry and will follow up. -We'll watch his weight and nutrition status closely when he is on chemotherapy. -He is on TPN and has a PEG tube. The patient states he is not using his PEG tube and is not taking Glucerna due to fear of spiking his sugar. -He only eats 2 small meals a day with 2 cans of Ensure. I encouraged him to eat more, or increase his Ensure intake. -He will talk to our nutritionist again.  -he is off TPN now  -I encouraged him to eat 4-5 small meals a day.   4. Depression -The patient is depressed about his diagnosis. -We again discussed the patient speaking with social work and the chaplain for which he has their numbers. -He tried mirtazapine, but did not like it and has stopped .  5. Skin Rash -Possibly related to gemcitabine  -Started on steroids. Rash  resolved now  -Skin itches after treatment but no rash, he will take additional dexa after chemo to prevent rash   Plan -Lab reviewed, adequate for treatment, we'll proceed gemcitabine today, with premedication dexamethasone 10 mg. Take steroid for 3-5 days to help prevent skin rash and itching  -he will start Xeloda, 1000mg  BID when he receives it, it's still pending  -f/u in 3 weeks.    All questions were answered. The patient knows to call the clinic with any problems, questions or concerns.  I spent 20 minutes counseling the patient face to face. The total time spent in the appointment was 25 minutes and more than 50% was on counseling.  This document serves as a record of services personally performed by Craig Merle, MD. It was created on her behalf by Martinique Casey, a trained medical scribe. The creation of this record is based on the scribe's personal observations and the provider's statements to them. This document has been checked and approved by the attending provider.  I have reviewed the above documentation for accuracy and completeness and I agree with the above.   Craig Merle, MD 11/17/2016

## 2016-11-13 ENCOUNTER — Telehealth: Payer: Self-pay | Admitting: *Deleted

## 2016-11-13 MED ORDER — CAPECITABINE 500 MG PO TABS: 825.0000 mg/m2 | ORAL_TABLET | Freq: Two times a day (BID) | ORAL | 2 refills | Status: AC

## 2016-11-13 NOTE — Telephone Encounter (Signed)
Faxed last office notes to Felicity Pellegrini, RN, CCM @ [facility] as per Dr. Ernestina Penna instructions. [facility]      Phone    [phone]           ;   Fax      [phone].

## 2016-11-13 NOTE — Telephone Encounter (Signed)
Oral Chemotherapy Pharmacist Encounter  Received notification from [facility] that per insurance requirement, they have transferred patient's Xeloda prescription to [facility]. I called [facility] at [phone] to follow-up on status of Xeloda prescription. Prescription clarification needed, MD consulted, Rx clarified and updated in Epic. Prescription will continue to be processed, expected delivery date to patient Monday 11/17/16.  Oral Oncology Clinic will continue to follow.  Johny Drilling, PharmD, BCPS, BCOP 11/13/2016  1:24 PM Oral Oncology Clinic [phone]

## 2016-11-17 ENCOUNTER — Encounter: Payer: Self-pay | Admitting: Hematology

## 2016-11-17 ENCOUNTER — Ambulatory Visit: Payer: BLUE CROSS/BLUE SHIELD

## 2016-11-17 ENCOUNTER — Ambulatory Visit (HOSPITAL_BASED_OUTPATIENT_CLINIC_OR_DEPARTMENT_OTHER): Payer: BLUE CROSS/BLUE SHIELD

## 2016-11-17 ENCOUNTER — Telehealth: Payer: Self-pay | Admitting: Hematology

## 2016-11-17 ENCOUNTER — Ambulatory Visit (HOSPITAL_BASED_OUTPATIENT_CLINIC_OR_DEPARTMENT_OTHER): Payer: BLUE CROSS/BLUE SHIELD | Admitting: Hematology

## 2016-11-17 ENCOUNTER — Other Ambulatory Visit (HOSPITAL_BASED_OUTPATIENT_CLINIC_OR_DEPARTMENT_OTHER): Payer: BLUE CROSS/BLUE SHIELD

## 2016-11-17 VITALS — BP 130/73 | HR 82 | Temp 98.9°F | Resp 16 | Ht 71.0 in | Wt 161.0 lb

## 2016-11-17 DIAGNOSIS — Z5111 Encounter for antineoplastic chemotherapy: Secondary | ICD-10-CM | POA: Diagnosis not present

## 2016-11-17 DIAGNOSIS — C259 Malignant neoplasm of pancreas, unspecified: Secondary | ICD-10-CM

## 2016-11-17 DIAGNOSIS — C250 Malignant neoplasm of head of pancreas: Secondary | ICD-10-CM | POA: Diagnosis not present

## 2016-11-17 DIAGNOSIS — E46 Unspecified protein-calorie malnutrition: Secondary | ICD-10-CM

## 2016-11-17 DIAGNOSIS — E118 Type 2 diabetes mellitus with unspecified complications: Secondary | ICD-10-CM | POA: Diagnosis not present

## 2016-11-17 DIAGNOSIS — R634 Abnormal weight loss: Secondary | ICD-10-CM

## 2016-11-17 DIAGNOSIS — Z95828 Presence of other vascular implants and grafts: Secondary | ICD-10-CM

## 2016-11-17 DIAGNOSIS — L299 Pruritus, unspecified: Secondary | ICD-10-CM | POA: Diagnosis not present

## 2016-11-17 DIAGNOSIS — F329 Major depressive disorder, single episode, unspecified: Secondary | ICD-10-CM

## 2016-11-17 LAB — CBC WITH DIFFERENTIAL/PLATELET
BASO%: 1.2 % (ref 0.0–2.0)
Basophils Absolute: 0 10*3/uL (ref 0.0–0.1)
EOS ABS: 0.3 10*3/uL (ref 0.0–0.5)
EOS%: 7.5 % — ABNORMAL HIGH (ref 0.0–7.0)
HEMATOCRIT: 34 % — AB (ref 38.4–49.9)
HGB: 10.9 g/dL — ABNORMAL LOW (ref 13.0–17.1)
LYMPH#: 0.9 10*3/uL (ref 0.9–3.3)
LYMPH%: 24.4 % (ref 14.0–49.0)
MCH: 25.6 pg — ABNORMAL LOW (ref 27.2–33.4)
MCHC: 32 g/dL (ref 32.0–36.0)
MCV: 79.9 fL (ref 79.3–98.0)
MONO#: 0.4 10*3/uL (ref 0.1–0.9)
MONO%: 11.2 % (ref 0.0–14.0)
NEUT#: 2 10*3/uL (ref 1.5–6.5)
NEUT%: 55.7 % (ref 39.0–75.0)
PLATELETS: 259 10*3/uL (ref 140–400)
RBC: 4.26 10*6/uL (ref 4.20–5.82)
RDW: 16.8 % — ABNORMAL HIGH (ref 11.0–14.6)
WBC: 3.6 10*3/uL — ABNORMAL LOW (ref 4.0–10.3)

## 2016-11-17 LAB — COMPREHENSIVE METABOLIC PANEL
ALT: 33 U/L (ref 0–55)
ANION GAP: 9 meq/L (ref 3–11)
AST: 27 U/L (ref 5–34)
Albumin: 3.2 g/dL — ABNORMAL LOW (ref 3.5–5.0)
Alkaline Phosphatase: 96 U/L (ref 40–150)
BUN: 6.1 mg/dL — AB (ref 7.0–26.0)
CHLORIDE: 107 meq/L (ref 98–109)
CO2: 26 mEq/L (ref 22–29)
CREATININE: 1 mg/dL (ref 0.7–1.3)
Calcium: 8.7 mg/dL (ref 8.4–10.4)
Glucose: 275 mg/dl — ABNORMAL HIGH (ref 70–140)
Potassium: 4.4 mEq/L (ref 3.5–5.1)
Sodium: 141 mEq/L (ref 136–145)
TOTAL PROTEIN: 5.8 g/dL — AB (ref 6.4–8.3)
Total Bilirubin: 0.34 mg/dL (ref 0.20–1.20)

## 2016-11-17 MED ORDER — HEPARIN SOD (PORK) LOCK FLUSH 100 UNIT/ML IV SOLN
500.0000 [IU] | Freq: Once | INTRAVENOUS | Status: AC | PRN
  Administered 2016-11-17: 500 [IU]
  Filled 2016-11-17: qty 5

## 2016-11-17 MED ORDER — DEXAMETHASONE SODIUM PHOSPHATE 10 MG/ML IJ SOLN
10.0000 mg | Freq: Once | INTRAMUSCULAR | Status: AC
  Administered 2016-11-17: 10 mg via INTRAVENOUS

## 2016-11-17 MED ORDER — SODIUM CHLORIDE 0.9% FLUSH
10.0000 mL | INTRAVENOUS | Status: DC | PRN
  Administered 2016-11-17: 10 mL
  Filled 2016-11-17: qty 10

## 2016-11-17 MED ORDER — SODIUM CHLORIDE 0.9 % IV SOLN
Freq: Once | INTRAVENOUS | Status: AC
  Administered 2016-11-17: 15:00:00 via INTRAVENOUS

## 2016-11-17 MED ORDER — PROCHLORPERAZINE MALEATE 10 MG PO TABS
10.0000 mg | ORAL_TABLET | Freq: Once | ORAL | Status: AC
  Administered 2016-11-17: 10 mg via ORAL

## 2016-11-17 MED ORDER — DEXAMETHASONE 2 MG PO TABS: 2.0000 mg | ORAL_TABLET | Freq: Two times a day (BID) | ORAL | 0 refills | Status: DC

## 2016-11-17 MED ORDER — GEMCITABINE HCL CHEMO INJECTION 1 GM/26.3ML
1000.0000 mg/m2 | Freq: Once | INTRAVENOUS | Status: AC
  Administered 2016-11-17: 1976 mg via INTRAVENOUS
  Filled 2016-11-17: qty 51.97

## 2016-11-17 MED ORDER — PROCHLORPERAZINE MALEATE 10 MG PO TABS
ORAL_TABLET | ORAL | Status: AC
  Filled 2016-11-17: qty 1

## 2016-11-17 MED ORDER — SODIUM CHLORIDE 0.9 % IJ SOLN
10.0000 mL | INTRAMUSCULAR | Status: DC | PRN
  Administered 2016-11-17: 10 mL via INTRAVENOUS
  Filled 2016-11-17: qty 10

## 2016-11-17 MED ORDER — DEXAMETHASONE SODIUM PHOSPHATE 10 MG/ML IJ SOLN
INTRAMUSCULAR | Status: AC
  Filled 2016-11-17: qty 1

## 2016-11-17 NOTE — Patient Instructions (Signed)
[facility] Discharge Instructions for Patients Receiving Chemotherapy  Today you received the following chemotherapy agents Gemzar  To help prevent nausea and vomiting after your treatment, we encourage you to take your nausea medication as prescribed   If you develop nausea and vomiting that is not controlled by your nausea medication, call the clinic.   BELOW ARE SYMPTOMS THAT SHOULD BE REPORTED IMMEDIATELY:  *FEVER GREATER THAN 100.5 F  *CHILLS WITH OR WITHOUT FEVER  NAUSEA AND VOMITING THAT IS NOT CONTROLLED WITH YOUR NAUSEA MEDICATION  *UNUSUAL SHORTNESS OF BREATH  *UNUSUAL BRUISING OR BLEEDING  TENDERNESS IN MOUTH AND THROAT WITH OR WITHOUT PRESENCE OF ULCERS  *URINARY PROBLEMS  *BOWEL PROBLEMS  UNUSUAL RASH Items with * indicate a potential emergency and should be followed up as soon as possible.  Feel free to call the clinic you have any questions or concerns. The clinic phone number is [phone].  Please show the CHEMO ALERT CARD at check-in to the Emergency Department and triage nurse.   

## 2016-11-17 NOTE — Patient Instructions (Signed)
Implanted Port Home Guide An implanted port is a type of central line that is placed under the skin. Central lines are used to provide IV access when treatment or nutrition needs to be given through a person's veins. Implanted ports are used for long-term IV access. An implanted port may be placed because:  You need IV medicine that would be irritating to the small veins in your hands or arms.  You need long-term IV medicines, such as antibiotics.  You need IV nutrition for a long period.  You need frequent blood draws for lab tests.  You need dialysis.  Implanted ports are usually placed in the chest area, but they can also be placed in the upper arm, the abdomen, or the leg. An implanted port has two main parts:  Reservoir. The reservoir is round and will appear as a small, raised area under your skin. The reservoir is the part where a needle is inserted to give medicines or draw blood.  Catheter. The catheter is a thin, flexible tube that extends from the reservoir. The catheter is placed into a large vein. Medicine that is inserted into the reservoir goes into the catheter and then into the vein.  How will I care for my incision site? Do not get the incision site wet. Bathe or shower as directed by your health care provider. How is my port accessed? Special steps must be taken to access the port:  Before the port is accessed, a numbing cream can be placed on the skin. This helps numb the skin over the port site.  Your health care provider uses a sterile technique to access the port. ? Your health care provider must put on a mask and sterile gloves. ? The skin over your port is cleaned carefully with an antiseptic and allowed to dry. ? The port is gently pinched between sterile gloves, and a needle is inserted into the port.  Only "non-coring" port needles should be used to access the port. Once the port is accessed, a blood return should be checked. This helps ensure that the port  is in the vein and is not clogged.  If your port needs to remain accessed for a constant infusion, a clear (transparent) bandage will be placed over the needle site. The bandage and needle will need to be changed every week, or as directed by your health care provider.  Keep the bandage covering the needle clean and dry. Do not get it wet. Follow your health care provider's instructions on how to take a shower or bath while the port is accessed.  If your port does not need to stay accessed, no bandage is needed over the port.  What is flushing? Flushing helps keep the port from getting clogged. Follow your health care provider's instructions on how and when to flush the port. Ports are usually flushed with saline solution or a medicine called heparin. The need for flushing will depend on how the port is used.  If the port is used for intermittent medicines or blood draws, the port will need to be flushed: ? After medicines have been given. ? After blood has been drawn. ? As part of routine maintenance.  If a constant infusion is running, the port may not need to be flushed.  How long will my port stay implanted? The port can stay in for as long as your health care provider thinks it is needed. When it is time for the port to come out, surgery will be   done to remove it. The procedure is similar to the one performed when the port was put in. When should I seek immediate medical care? When you have an implanted port, you should seek immediate medical care if:  You notice a bad smell coming from the incision site.  You have swelling, redness, or drainage at the incision site.  You have more swelling or pain at the port site or the surrounding area.  You have a fever that is not controlled with medicine.  This information is not intended to replace advice given to you by your health care provider. Make sure you discuss any questions you have with your health care provider. Document  Released: 07/28/2005 Document Revised: 01/03/2016 Document Reviewed: 04/04/2013 Elsevier Interactive Patient Education  2017 Elsevier Inc.  

## 2016-11-17 NOTE — Telephone Encounter (Signed)
Gave patient AVS and calender per 4/9 los. 

## 2016-11-21 ENCOUNTER — Telehealth: Payer: Self-pay | Admitting: *Deleted

## 2016-11-21 NOTE — Telephone Encounter (Signed)
Received call from triage RN earlier today stating pt needs to know when to start xeloda.  He received script today.  Unable to reach pt & therefore called emergency contact, wife, Craig [name].  Informed per Dr Burr Medico that pt can start with 2 tabs bid x 3 days & if tol well can go to [facility] & do a short course this time.  Take through 12/01/16.  Had [place] repeat back instructions & she expresses understanding & will tell [name].

## 2016-11-24 ENCOUNTER — Other Ambulatory Visit (HOSPITAL_BASED_OUTPATIENT_CLINIC_OR_DEPARTMENT_OTHER): Payer: BLUE CROSS/BLUE SHIELD

## 2016-11-24 ENCOUNTER — Ambulatory Visit: Payer: BLUE CROSS/BLUE SHIELD

## 2016-11-24 ENCOUNTER — Ambulatory Visit (HOSPITAL_BASED_OUTPATIENT_CLINIC_OR_DEPARTMENT_OTHER): Payer: BLUE CROSS/BLUE SHIELD

## 2016-11-24 ENCOUNTER — Telehealth: Payer: Self-pay | Admitting: Hematology

## 2016-11-24 VITALS — BP 147/87 | HR 79 | Temp 98.0°F | Resp 17

## 2016-11-24 DIAGNOSIS — C259 Malignant neoplasm of pancreas, unspecified: Secondary | ICD-10-CM

## 2016-11-24 DIAGNOSIS — Z95828 Presence of other vascular implants and grafts: Secondary | ICD-10-CM | POA: Diagnosis not present

## 2016-11-24 LAB — COMPREHENSIVE METABOLIC PANEL
ALT: 29 U/L (ref 0–55)
AST: 21 U/L (ref 5–34)
Albumin: 3.3 g/dL — ABNORMAL LOW (ref 3.5–5.0)
Alkaline Phosphatase: 96 U/L (ref 40–150)
Anion Gap: 9 mEq/L (ref 3–11)
BUN: 5.1 mg/dL — ABNORMAL LOW (ref 7.0–26.0)
CHLORIDE: 108 meq/L (ref 98–109)
CO2: 26 meq/L (ref 22–29)
CREATININE: 0.8 mg/dL (ref 0.7–1.3)
Calcium: 9.1 mg/dL (ref 8.4–10.4)
GLUCOSE: 92 mg/dL (ref 70–140)
POTASSIUM: 3.5 meq/L (ref 3.5–5.1)
SODIUM: 142 meq/L (ref 136–145)
TOTAL PROTEIN: 6.1 g/dL — AB (ref 6.4–8.3)
Total Bilirubin: 0.24 mg/dL (ref 0.20–1.20)

## 2016-11-24 LAB — CBC WITH DIFFERENTIAL/PLATELET
BASO%: 0.7 % (ref 0.0–2.0)
Basophils Absolute: 0 10*3/uL (ref 0.0–0.1)
EOS ABS: 0.2 10*3/uL (ref 0.0–0.5)
EOS%: 11.8 % — ABNORMAL HIGH (ref 0.0–7.0)
HCT: 32.8 % — ABNORMAL LOW (ref 38.4–49.9)
HGB: 10.9 g/dL — ABNORMAL LOW (ref 13.0–17.1)
LYMPH%: 51.1 % — AB (ref 14.0–49.0)
MCH: 26.2 pg — ABNORMAL LOW (ref 27.2–33.4)
MCHC: 33.3 g/dL (ref 32.0–36.0)
MCV: 78.7 fL — AB (ref 79.3–98.0)
MONO#: 0.1 10*3/uL (ref 0.1–0.9)
MONO%: 6.4 % (ref 0.0–14.0)
NEUT#: 0.6 10*3/uL — ABNORMAL LOW (ref 1.5–6.5)
NEUT%: 30 % — ABNORMAL LOW (ref 39.0–75.0)
Platelets: 253 10*3/uL (ref 140–400)
RBC: 4.17 10*6/uL — AB (ref 4.20–5.82)
RDW: 16.5 % — ABNORMAL HIGH (ref 11.0–14.6)
WBC: 2.1 10*3/uL — AB (ref 4.0–10.3)
lymph#: 1.1 10*3/uL (ref 0.9–3.3)

## 2016-11-24 MED ORDER — SODIUM CHLORIDE 0.9% FLUSH
10.0000 mL | INTRAVENOUS | Status: DC | PRN
  Administered 2016-11-24: 10 mL via INTRAVENOUS
  Filled 2016-11-24: qty 10

## 2016-11-24 MED ORDER — SODIUM CHLORIDE 0.9 % IJ SOLN
10.0000 mL | INTRAMUSCULAR | Status: DC | PRN
  Administered 2016-11-24: 10 mL via INTRAVENOUS
  Filled 2016-11-24: qty 10

## 2016-11-24 MED ORDER — HEPARIN SOD (PORK) LOCK FLUSH 100 UNIT/ML IV SOLN
500.0000 [IU] | Freq: Once | INTRAVENOUS | Status: AC
  Administered 2016-11-24: 500 [IU] via INTRAVENOUS
  Filled 2016-11-24: qty 5

## 2016-11-24 NOTE — Progress Notes (Signed)
WBC 2.1. ANC 0.6, pt denies any s/s of infection, pt reports diarrhea. Dr. Burr Medico aware and at chairside to discuss plan with patient. No treatment today, pt to have lab and Gemzar infusion 12/01/16. Pt educated to see scheduling prior to leaving, and to call clinic with any questions or concerns. Pt verbalizes understanding. Pt stable at discharge.

## 2016-11-24 NOTE — Patient Instructions (Signed)
Implanted Port Home Guide An implanted port is a type of central line that is placed under the skin. Central lines are used to provide IV access when treatment or nutrition needs to be given through a person's veins. Implanted ports are used for long-term IV access. An implanted port may be placed because:  You need IV medicine that would be irritating to the small veins in your hands or arms.  You need long-term IV medicines, such as antibiotics.  You need IV nutrition for a long period.  You need frequent blood draws for lab tests.  You need dialysis.  Implanted ports are usually placed in the chest area, but they can also be placed in the upper arm, the abdomen, or the leg. An implanted port has two main parts:  Reservoir. The reservoir is round and will appear as a small, raised area under your skin. The reservoir is the part where a needle is inserted to give medicines or draw blood.  Catheter. The catheter is a thin, flexible tube that extends from the reservoir. The catheter is placed into a large vein. Medicine that is inserted into the reservoir goes into the catheter and then into the vein.  How will I care for my incision site? Do not get the incision site wet. Bathe or shower as directed by your health care provider. How is my port accessed? Special steps must be taken to access the port:  Before the port is accessed, a numbing cream can be placed on the skin. This helps numb the skin over the port site.  Your health care provider uses a sterile technique to access the port. ? Your health care provider must put on a mask and sterile gloves. ? The skin over your port is cleaned carefully with an antiseptic and allowed to dry. ? The port is gently pinched between sterile gloves, and a needle is inserted into the port.  Only "non-coring" port needles should be used to access the port. Once the port is accessed, a blood return should be checked. This helps ensure that the port  is in the vein and is not clogged.  If your port needs to remain accessed for a constant infusion, a clear (transparent) bandage will be placed over the needle site. The bandage and needle will need to be changed every week, or as directed by your health care provider.  Keep the bandage covering the needle clean and dry. Do not get it wet. Follow your health care provider's instructions on how to take a shower or bath while the port is accessed.  If your port does not need to stay accessed, no bandage is needed over the port.  What is flushing? Flushing helps keep the port from getting clogged. Follow your health care provider's instructions on how and when to flush the port. Ports are usually flushed with saline solution or a medicine called heparin. The need for flushing will depend on how the port is used.  If the port is used for intermittent medicines or blood draws, the port will need to be flushed: ? After medicines have been given. ? After blood has been drawn. ? As part of routine maintenance.  If a constant infusion is running, the port may not need to be flushed.  How long will my port stay implanted? The port can stay in for as long as your health care provider thinks it is needed. When it is time for the port to come out, surgery will be   done to remove it. The procedure is similar to the one performed when the port was put in. When should I seek immediate medical care? When you have an implanted port, you should seek immediate medical care if:  You notice a bad smell coming from the incision site.  You have swelling, redness, or drainage at the incision site.  You have more swelling or pain at the port site or the surrounding area.  You have a fever that is not controlled with medicine.  This information is not intended to replace advice given to you by your health care provider. Make sure you discuss any questions you have with your health care provider. Document  Released: 07/28/2005 Document Revised: 01/03/2016 Document Reviewed: 04/04/2013 Elsevier Interactive Patient Education  2017 Elsevier Inc.  

## 2016-11-24 NOTE — Telephone Encounter (Signed)
Appointments scheduled per 11/24/16 schd msg. Patient was given a copy of the updated appointment schedule.

## 2016-11-24 NOTE — Patient Instructions (Signed)
No treatment today (see note)

## 2016-12-01 ENCOUNTER — Ambulatory Visit (HOSPITAL_BASED_OUTPATIENT_CLINIC_OR_DEPARTMENT_OTHER): Payer: BLUE CROSS/BLUE SHIELD

## 2016-12-01 ENCOUNTER — Other Ambulatory Visit (HOSPITAL_BASED_OUTPATIENT_CLINIC_OR_DEPARTMENT_OTHER): Payer: BLUE CROSS/BLUE SHIELD

## 2016-12-01 VITALS — BP 142/91 | HR 86 | Temp 99.7°F | Resp 16

## 2016-12-01 DIAGNOSIS — C259 Malignant neoplasm of pancreas, unspecified: Secondary | ICD-10-CM

## 2016-12-01 DIAGNOSIS — Z5111 Encounter for antineoplastic chemotherapy: Secondary | ICD-10-CM

## 2016-12-01 LAB — COMPREHENSIVE METABOLIC PANEL
ALT: 28 U/L (ref 0–55)
ANION GAP: 8 meq/L (ref 3–11)
AST: 24 U/L (ref 5–34)
Albumin: 3.3 g/dL — ABNORMAL LOW (ref 3.5–5.0)
Alkaline Phosphatase: 107 U/L (ref 40–150)
BILIRUBIN TOTAL: 0.29 mg/dL (ref 0.20–1.20)
BUN: 3.9 mg/dL — ABNORMAL LOW (ref 7.0–26.0)
CO2: 27 mEq/L (ref 22–29)
Calcium: 8.8 mg/dL (ref 8.4–10.4)
Chloride: 106 mEq/L (ref 98–109)
Creatinine: 0.9 mg/dL (ref 0.7–1.3)
Glucose: 120 mg/dl (ref 70–140)
POTASSIUM: 4.1 meq/L (ref 3.5–5.1)
Sodium: 142 mEq/L (ref 136–145)
TOTAL PROTEIN: 6 g/dL — AB (ref 6.4–8.3)

## 2016-12-01 LAB — CBC WITH DIFFERENTIAL/PLATELET
BASO%: 0.3 % (ref 0.0–2.0)
BASOS ABS: 0 10*3/uL (ref 0.0–0.1)
EOS ABS: 0.3 10*3/uL (ref 0.0–0.5)
EOS%: 7 % (ref 0.0–7.0)
HCT: 34.5 % — ABNORMAL LOW (ref 38.4–49.9)
HGB: 11.3 g/dL — ABNORMAL LOW (ref 13.0–17.1)
LYMPH%: 18 % (ref 14.0–49.0)
MCH: 26.2 pg — AB (ref 27.2–33.4)
MCHC: 32.8 g/dL (ref 32.0–36.0)
MCV: 79.9 fL (ref 79.3–98.0)
MONO#: 0.7 10*3/uL (ref 0.1–0.9)
MONO%: 16.3 % — ABNORMAL HIGH (ref 0.0–14.0)
NEUT#: 2.3 10*3/uL (ref 1.5–6.5)
NEUT%: 58.4 % (ref 39.0–75.0)
PLATELETS: 184 10*3/uL (ref 140–400)
RBC: 4.32 10*6/uL (ref 4.20–5.82)
RDW: 16.6 % — ABNORMAL HIGH (ref 11.0–14.6)
WBC: 4 10*3/uL (ref 4.0–10.3)
lymph#: 0.7 10*3/uL — ABNORMAL LOW (ref 0.9–3.3)

## 2016-12-01 MED ORDER — SODIUM CHLORIDE 0.9 % IV SOLN
Freq: Once | INTRAVENOUS | Status: AC
  Administered 2016-12-01: 15:00:00 via INTRAVENOUS

## 2016-12-01 MED ORDER — SODIUM CHLORIDE 0.9 % IV SOLN
1000.0000 mg/m2 | Freq: Once | INTRAVENOUS | Status: AC
  Administered 2016-12-01: 1976 mg via INTRAVENOUS
  Filled 2016-12-01: qty 51.97

## 2016-12-01 MED ORDER — DEXAMETHASONE SODIUM PHOSPHATE 10 MG/ML IJ SOLN
INTRAMUSCULAR | Status: AC
  Filled 2016-12-01: qty 1

## 2016-12-01 MED ORDER — PROCHLORPERAZINE MALEATE 10 MG PO TABS
ORAL_TABLET | ORAL | Status: AC
  Filled 2016-12-01: qty 1

## 2016-12-01 MED ORDER — DEXAMETHASONE SODIUM PHOSPHATE 10 MG/ML IJ SOLN
10.0000 mg | Freq: Once | INTRAMUSCULAR | Status: AC
  Administered 2016-12-01: 10 mg via INTRAVENOUS

## 2016-12-01 MED ORDER — SODIUM CHLORIDE 0.9% FLUSH
10.0000 mL | INTRAVENOUS | Status: DC | PRN
  Administered 2016-12-01: 10 mL
  Filled 2016-12-01: qty 10

## 2016-12-01 MED ORDER — PROCHLORPERAZINE MALEATE 10 MG PO TABS
10.0000 mg | ORAL_TABLET | Freq: Once | ORAL | Status: AC
  Administered 2016-12-01: 10 mg via ORAL

## 2016-12-01 MED ORDER — HEPARIN SOD (PORK) LOCK FLUSH 100 UNIT/ML IV SOLN
500.0000 [IU] | Freq: Once | INTRAVENOUS | Status: AC | PRN
  Administered 2016-12-01: 500 [IU]
  Filled 2016-12-01: qty 5

## 2016-12-01 NOTE — Progress Notes (Signed)
Per MD Waldon Reining to treat with temp 99.7 today.   Patient reports abdomen pain that "comes and goes" (see flowsheets). Patient reports that the pain started a couple days ago. Patient reports that he is currently not having any pain today. Patient reports that when he is in pain, he has not tried taking his home pain prescription because "I don't like to take medications". MD Burr Medico aware. No new orders at this time. Will continue to monitor.

## 2016-12-01 NOTE — Patient Instructions (Signed)
[facility] Discharge Instructions for Patients Receiving Chemotherapy  Today you received the following chemotherapy agents Gemzar  To help prevent nausea and vomiting after your treatment, we encourage you to take your nausea medication as directed.    If you develop nausea and vomiting that is not controlled by your nausea medication, call the clinic.   BELOW ARE SYMPTOMS THAT SHOULD BE REPORTED IMMEDIATELY:  *FEVER GREATER THAN 100.5 F  *CHILLS WITH OR WITHOUT FEVER  NAUSEA AND VOMITING THAT IS NOT CONTROLLED WITH YOUR NAUSEA MEDICATION  *UNUSUAL SHORTNESS OF BREATH  *UNUSUAL BRUISING OR BLEEDING  TENDERNESS IN MOUTH AND THROAT WITH OR WITHOUT PRESENCE OF ULCERS  *URINARY PROBLEMS  *BOWEL PROBLEMS  UNUSUAL RASH Items with * indicate a potential emergency and should be followed up as soon as possible.  Feel free to call the clinic you have any questions or concerns. The clinic phone number is [phone].  Please show the CHEMO ALERT CARD at check-in to the Emergency Department and triage nurse.   

## 2016-12-01 NOTE — Addendum Note (Signed)
Addended by: Truitt Merle on: 12/01/2016 03:21 PM   Modules accepted: Orders

## 2016-12-02 LAB — CANCER ANTIGEN 19-9: CA 19-9: 261 U/mL — ABNORMAL HIGH (ref 0–35)

## 2016-12-08 ENCOUNTER — Telehealth: Payer: Self-pay

## 2016-12-08 ENCOUNTER — Ambulatory Visit: Payer: BLUE CROSS/BLUE SHIELD

## 2016-12-08 ENCOUNTER — Ambulatory Visit: Payer: BLUE CROSS/BLUE SHIELD | Admitting: Hematology

## 2016-12-08 ENCOUNTER — Other Ambulatory Visit: Payer: BLUE CROSS/BLUE SHIELD

## 2016-12-08 NOTE — Telephone Encounter (Signed)
Pt called stating he got dx with the flu by his pcp and he will not be coming in for treatment today. His is requesting instruction for what to do re next appointments.

## 2016-12-11 NOTE — Progress Notes (Signed)
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 Question loss of fat plane between the posterior aspect of mass and IVC. Mildly dilated CBD 10 mm.      03/13/2016 Procedure    ERCP and medical stem placement in CBD      03/20/2016 Initial Diagnosis    Primary pancreatic adenocarcinoma ([place])      03/20/2016 Initial Biopsy    FNA of the pancreatic head mass from EUS showed a malignant cells consistent with adenocarcinoma      03/20/2016 Procedure    EUS showed an irregular mass in the pancreatic head, measuring 3.9 x 3.2 cm, no invading of the celiac trunk or portal vein. The mass was biopsied.       04/09/2016 -  Neo-Adjuvant Chemotherapy    Gemcitabine and Abraxane on Day 1, 8 every 21 days       06/05/2016 Imaging    CT CAP w Contrast 06/05/16 IMPRESSION: Mildly decreased size of mass within pancreatic head and uncinateprocess, with common bile duct stent in appropriate position. No evidence of metastatic disease or other acute findings within thechest, abdomen, or  pelvis.      08/21/2016 Surgery    Whipple surgery by Dr. Barry Dienes       08/21/2016 Pathology Results    Whipple surgery showed ampullary invasive adenocarcinoma, moderately differentiated, 2.8 cm, tumor invades peripancreatic tissue and pancreas more than 0.5 cm, resection margins are negative, metastatic carcinoma in 1 of 16 lymph nodes. Biopsy of the liver and portal lymph nodes are negative.       08/31/2016 Imaging    CT CAP w Contrast 08/31/16 IMPRESSION: 1. Status post classic appearing Whipple procedure by dehiscence of suture sites. Marked gastric distention with fluid and food may reflect delayed gastric emptying. Fluid-filled jejunum has the appearance of postoperative ileus. No definite transition site is identified. 2. No sterile fluid collection or abscess. There are mottled gas like lucencies in the gallbladder fossa which may represent postop change.      HISTORY OF PRESENTING ILLNESS:  Craig [name] 61 y.o. male is here because of His newly diagnosed pancreatic cancer. He is coming up by his wife to our multidisciplinary check clinic today.  He has had epigastric pain for the last 4-6 weeks. He describes the pain is intermittent, last a few hours and a few times a day, located in the low mid chest and upper abdomen, not related to eating or position. He denies nausea, cough, or dyspnea. His appetite has decreased lately, and he complains about moderate fatigue, he was not able  to tolerate his physically demanding job, and he has been out of week for 3 weeks. He lost about 30-40 lbs in the past 2 months.   He was seen by his primary care physician a few times, and he developed jaundice, and abnormal liver function. He was sent to Hospital by his primary care physician. During his hospital stay, CT scan reviewed a pancreatic head mass, and mild dilatation of bile duct. He was seen by GI Dr. Jacelyn Grip, underwent ERCP and metal stent placement in CBD. He subsequently underwent EUS  which showed an irregular mass in the pancreatic head, measuring 3.9 cm, no invading of the celiac trunk or portal vein. The mass was biopsied, which showed adenocarcinoma.  He is married, lives with his wife. His abdominal pain, fatigue has not changed much since the CBD stent placement. He is able to tolerate routine activities, no other new complaints. He has been drinking beer moderately for the past 40 years, but stopped a few weeks ago.   CURRENT THERAPY: adjuvant chemotherapy with Gemcitabine 1000mg /m2 on day 1, 8 every 21 days, started on 10/24/2016, pending Xeloda  INTERIM HISTORY:  Craig [name] returns for follow-up and treatment. He presents to the clinic today reporting Last week he had the flu with rhinorrhea. He reports to feel a a lot better now. His last treatment was 4/23. His weight is 162lbs today. Overall he reports to feeling pretty good. Now every time he takes his medication and eating he has a regular bowel movement. This happens with all his medication.   MEDICAL HISTORY:  Past Medical History:  Diagnosis Date  . Diabetes mellitus without complication ([place])   . ETOH abuse   . Hypertension    off bp meds 3 4- months ago  . Primary pancreatic adenocarcinoma ([place]) 03/28/2016  . Type 2 diabetes mellitus ([place])     SURGICAL HISTORY: Past Surgical History:  Procedure Laterality Date  . ERCP Left 03/13/2016   Procedure: ENDOSCOPIC RETROGRADE CHOLANGIOPANCREATOGRAPHY (ERCP);  Surgeon: Carol Ada, MD;  Location: [facility] ENDOSCOPY;  Service: Endoscopy;  Laterality: Left;  . EUS N/A 03/20/2016   Procedure: UPPER ENDOSCOPIC ULTRASOUND (EUS) LINEAR;  Surgeon: Carol Ada, MD;  Location: [facility] ENDOSCOPY;  Service: Endoscopy;  Laterality: N/A;  . HERNIA REPAIR    . KNEE ARTHROSCOPY     left and right  . LAPAROSCOPY N/A 08/21/2016   Procedure: LAPAROSCOPY DIAGNOSTIC;  Surgeon: Stark Klein, MD;  Location: [facility] ORS;  Service: General;  Laterality: N/A;  . PORTACATH PLACEMENT N/A 04/07/2016     Procedure: INSERTION PORT-A-CATH;  Surgeon: Stark Klein, MD;  Location: [facility];  Service: General;  Laterality: N/A;  . WHIPPLE PROCEDURE N/A 08/21/2016   Procedure: WHIPPLE PROCEDURE;  Surgeon: Stark Klein, MD;  Location: [facility] ORS;  Service: General;  Laterality: N/A;    SOCIAL HISTORY: Social History   Social History  . Marital status: Married    Spouse name: N/A  . Number of children: N/A  . Years of education: N/A   Occupational History  . Not on file.   Social History Main Topics  . Smoking status: Former Smoker    Packs/day: 1.00    Years: 15.00    Quit date: 08/12/1995  . Smokeless tobacco: Never Used  . Alcohol use Yes     Comment: he used to drink 6 pack beer a day for 40 years, quit in 02/2016   . Drug use: No  . Sexual activity: Not on file   Other Topics Concern  .  Not on file   Social History Narrative  . No narrative on file    FAMILY HISTORY: Family History  Problem Relation Age of Onset  . Heart attack Mother     ALLERGIES:  is allergic to no known allergies.  MEDICATIONS:  Current Outpatient Prescriptions  Medication Sig Dispense Refill  . betamethasone dipropionate (DIPROLENE) 0.05 % cream Apply topically 2 (two) times daily. 30 g 0  . bisacodyl (DULCOLAX) 10 MG suppository Place 1 suppository (10 mg total) rectally daily as needed for moderate constipation. 12 suppository 0  . folic acid (FOLVITE) 1 MG tablet TAKE ONE TABLET BY MOUTH DAILY 30 tablet 0  . Insulin Glargine (LANTUS SOLOSTAR) 100 UNIT/ML Solostar Pen Inject 16 Units into the skin every morning. 15 mL 11  . lidocaine-prilocaine (EMLA) cream Apply to affected area once 30 g 3  . LORazepam (ATIVAN) 1 MG tablet Take 0.5-1 tablets (0.5-1 mg total) by mouth 3 (three) times daily as needed for anxiety. 15 tablet 0  . methocarbamol (ROBAXIN) 500 MG tablet Take 1 tablet (500 mg total) by mouth every 8 (eight) hours as needed for muscle spasms. 90 tablet 2  . metoCLOPramide (REGLAN) 10 MG  tablet Take 1 tablet (10 mg total) by mouth 4 (four) times daily. 120 tablet 3  . metoprolol tartrate (LOPRESSOR) 25 MG tablet Take 0.5 tablets (12.5 mg total) by mouth 2 (two) times daily. (Patient taking differently: Take 12.5 mg by mouth daily. ) 60 tablet 1  . mirtazapine (REMERON) 15 MG tablet Take 1 tablet (15 mg total) by mouth at bedtime. 30 tablet 1  . Nutritional Supplements (FEEDING SUPPLEMENT, VITAL AF 1.2 CAL,) LIQD Place 1,000 mLs into feeding tube continuous. 5000 mL 12  . ondansetron (ZOFRAN) 8 MG tablet Take 1 tablet (8 mg total) by mouth every 8 (eight) hours as needed (Nausea or vomiting). 30 tablet 1  . oxyCODONE (OXY IR/ROXICODONE) 5 MG immediate release tablet Take 1-2 tablets (5-10 mg total) by mouth every 4 (four) hours as needed for moderate pain, severe pain or breakthrough pain. 60 tablet 0  . Pancrelipase, Lip-Prot-Amyl, (ZENPEP PO) Take 2 capsules by mouth 3 (three) times daily.    . pantoprazole (PROTONIX) 40 MG tablet Take 1 tablet (40 mg total) by mouth daily. 30 tablet 3  . potassium chloride SA (K-DUR,KLOR-CON) 20 MEQ tablet Take 1 tablet (20 mEq total) by mouth daily. 30 tablet 1  . prochlorperazine (COMPAZINE) 10 MG tablet Take 1 tablet (10 mg total) by mouth every 8 (eight) hours as needed (Nausea or vomiting). 30 tablet 1  . simethicone (MYLICON) 80 MG chewable tablet Chew 1 tablet (80 mg total) by mouth 3 (three) times daily. 30 tablet 0  . thiamine 100 MG tablet Take 1 tablet (100 mg total) by mouth daily. 30 tablet 0  . capecitabine (XELODA) 500 MG tablet Take 3 tablets (1,500 mg total) by mouth 2 (two) times daily after a meal. For 14 days on, 7 days off (Patient not taking: Reported on 11/17/2016) 84 tablet 2   No current facility-administered medications for this visit.    Facility-Administered Medications Ordered in Other Visits  Medication Dose Route Frequency Provider Last Rate Last Dose  . 0.9 %  sodium chloride infusion   Intravenous Once Truitt Merle, MD       . dexamethasone (DECADRON) injection 10 mg  10 mg Intravenous Once Truitt Merle, MD      . gemcitabine (GEMZAR) 1,976 mg in sodium chloride 0.9 % 250  mL chemo infusion  1,000 mg/m2 (Treatment Plan Recorded) Intravenous Once Truitt Merle, MD      . heparin lock flush 100 unit/mL  500 Units Intracatheter Once PRN Truitt Merle, MD      . prochlorperazine (COMPAZINE) tablet 10 mg  10 mg Oral Once Truitt Merle, MD      . sodium chloride flush (NS) 0.9 % injection 10 mL  10 mL Intracatheter PRN Truitt Merle, MD       REVIEW OF SYSTEMS:   Constitutional: Denies fevers, chills or abnormal night sweats (+) loss of appetite (+) weight loss  Eyes: Denies blurriness of vision, double vision or watery eyes Ears, nose, mouth, throat, and face: Denies mucositis or sore throat  Respiratory: Denies cough, dyspnea or wheezes Cardiovascular: Denies palpitation, chest discomfort or lower extremity swelling Gastrointestinal:  Denies nausea, heartburn (+) regular bowel movements with medication along with meal Skin: Denies abnormal skin rashes (+) itching, whole body Lymphatics: Denies new lymphadenopathy or easy bruising Neurological:Denies numbness, tingling or new weaknesses Behavioral/Psych: Mood is stable, no new changes  All other systems were reviewed with the patient and are negative.  PHYSICAL EXAMINATION: ECOG PERFORMANCE STATUS: 1 - Symptomatic but completely ambulatory  Vitals:   12/15/16 1003  BP: 136/70  Pulse: 60  Resp: 20  Temp: 98.7 F (37.1 C)  TempSrc: Oral  SpO2: 100%  Weight: 162 lb (73.5 kg)  Height: 5\' 11"  (1.803 m)     GENERAL:alert, no distress and comfortable. In treatment chair. SKIN: skin color, texture, turgor are normal, no rashes or significant lesions, Mild skin pigmentation so his trunk, from his previous skin rash last week EYES: normal, conjunctiva are pink and non-injected, sclera clear OROPHARYNX:no exudate, no erythema and lips, buccal mucosa, and tongue normal  NECK:  supple, thyroid normal size, non-tender, without nodularity LYMPH:  no palpable lymphadenopathy in the cervical, axillary or inguinal LUNGS: clear to auscultation and percussion with normal breathing effort HEART: regular rate & rhythm and no murmurs and no lower extremity edema ABDOMEN: benign, soft, non-tender  Musculoskeletal:no cyanosis of digits and no clubbing  Extremities: no edema, PICC line has been removed  PSYCH: alert & oriented x 3 with fluent speech NEURO: no focal motor/sensory deficits  LABORATORY DATA:  I have reviewed the data as listed CBC Latest Ref Rng & Units 12/15/2016 12/01/2016 11/24/2016  WBC 4.0 - 10.3 10e3/uL 4.3 4.0 2.1(L)  Hemoglobin 13.0 - 17.1 g/dL 9.9(L) 11.3(L) 10.9(L)  Hematocrit 38.4 - 49.9 % 30.1(L) 34.5(L) 32.8(L)  Platelets 140 - 400 10e3/uL 303 184 253   CMP Latest Ref Rng & Units 12/15/2016 12/01/2016 11/24/2016  Glucose 70 - 140 mg/dl 262(H) 120 92  BUN 7.0 - 26.0 mg/dL 3.1(L) 3.9(L) 5.1(L)  Creatinine 0.7 - 1.3 mg/dL 0.8 0.9 0.8  Sodium 136 - 145 mEq/L 139 142 142  Potassium 3.5 - 5.1 mEq/L 3.7 4.1 3.5  Chloride 101 - 111 mmol/L - - -  CO2 22 - 29 mEq/L 25 27 26   Calcium 8.4 - 10.4 mg/dL 8.4 8.8 9.1  Total Protein 6.4 - 8.3 g/dL 5.4(L) 6.0(L) 6.1(L)  Total Bilirubin 0.20 - 1.20 mg/dL 0.36 0.29 0.24  Alkaline Phos 40 - 150 U/L 100 107 96  AST 5 - 34 U/L 23 24 21   ALT 0 - 55 U/L 27 28 29    Results for JEN, EPPINGER (MRN [id number]) as of 12/11/2016 11:27  Ref. Range 10/03/2016 10:35 11/03/2016 11:38 12/01/2016 14:19  CA 19-9 Latest Ref Range: 0 -  35 U/mL 17 84 (H) 261 (H)    PATHOLOGY REPORT  Diagnosis 08/21/16 1. Liver, biopsy - BENIGN VASCULAR PROLIFERATION. - NO MALIGNANCY IDENTIFIED. 2. Whipple procedure/resection - AMPULLARY INVASIVE ADENOCARCINOMA, MODERATELY DIFFERENTIATED, SPANNING 2.8 CM. - TUMOR INVADES PERIPANCREATIC TISSUE AND PANCREAS >0.5 CM. - RESECTION MARGINS ARE NEGATIVE. - METASTATIC CARCINOMA IN ONE OF SIXTEEN LYMPH  NODES (1/16). - CHRONIC GASTRITIS. - CHRONIC CHOLECYSTITIS. - SEE ONCOLOGY TABLE. 3. Lymph node, biopsy, portal - ONE OF ONE LYMPH NODE NEGATIVE FOR CARCINOMA (0/1). - LIPOGRANULOMATOUS CHANGE. 4. Lymph node, biopsy, common hepatic - ONE OF ONE LYMPH NODE NEGATIVE FOR CARCINOMA (0/1). - LIPOGRANULOMATOUS CHANGE. 5. Lymph node, biopsy, Peripancreatic - ONE OF ONE LYMPH NODE NEGATIVE FOR CARCINOMA (0/1).  Diagnosis 03/20/2016 FINE NEEDLE ASPIRATION, ENDOSCOPIC, PANCREAS HEAD (SPECIMEN 1 OF 1 COLLECTED 03/20/16): MALIGNANT CELLS CONSISTENT WITH ADENOCARCINOMA. Preliminary Diagnosis Intraoperative Diagnosis: Adequate. ([facility])  Diagnosis 03/13/2016 Duodenum, Biopsy, Duodenal mass - BENIGN ULCERATED AND INFLAMED SMALL BOWEL-TYPE MUCOSA. - THERE IS NO EVIDENCE OF MALIGNANCY. - SEE COMMENT.  RADIOGRAPHIC STUDIES: I have personally reviewed the radiological images as listed and agreed with the findings in the report.  CT Abdomen Pelvis w/ Contrast 08/31/2016 IMPRESSION: 1. Status post classic appearing Whipple procedure by dehiscence of suture sites. Marked gastric distention with fluid and food may reflect delayed gastric emptying. Fluid-filled jejunum has the appearance of postoperative ileus. No definite transition site is identified. 2. No sterile fluid collection or abscess. There are mottled gas like lucencies in the gallbladder fossa which may represent postop change.  EUS Dr. Benson Norway  Endosonographic Finding 03/20/2016 Findings: An irregular mass was identified in the pancreatic head. The mass was hypoechoic. The mass measured 39 mm by 32 mm in maximal cross-sectional diameter. The outer margins were irregular. An intact interface was seen between the mass and the celiac trunk and portal vein suggesting a lack of invasion. Fine needle aspiration for cytology was performed. Color Doppler imaging was utilized prior to needle puncture to confirm a lack of significant vascular  structures within the needle path. Five passes were made with the 25 gauge needle using a transduodenal approach. A stylet was used. A cytotechnologist was present to evaluate the adequacy of the specimen. The cellularity of the specimen was adequate. Final cytology results are pending. - A mass was identified in the pancreatic head. Tissue was obtained from this exam, and results are pending. However, the endosonographic appearance is highly suspicious for adenocarcinoma. Fine needle aspiration performed.  ERCP 03/13/2016 Impression: - One covered metal stent was placed into the common bile duct.  CT chest, abdomen, pelvis w contrast 06/05/2016 IMPRESSION: Mildly decreased size of mass within pancreatic head and uncinate process, with common bile duct stent in appropriate position.  No evidence of metastatic disease or other acute findings within the chest, abdomen, or pelvis.  ASSESSMENT & PLAN:  61 y.o. African-American male, with past medical history of diabetes and hypertension, presented with epigastric pain, weight loss, and jaundice.  1. 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 -The patient underwent surgery with Dr. Barry Dienes on 08/21/2016 with Whipple procedure/resection of the pancreas, liver biopsy, and lymph node biopsy. Liver biopsy was benign. Whipple procedure revealed moderately differentiated ampullary invasive adenocarcinoma measuring 2.8 cm. The tumor invaded the peripancreatic tissue and the pancreas > 0.5 cm. The resection margins were negative. 1/19 biopsied lymph nodes were positive. -We previously discussed the patient's pathology and high  risk of recurrence after surgery disease locally advanced disease even after neoadjuvant therapy.  -I recommend adjuvant chemo with gemcitabine and Xeloda, to reduce his risk of  recurrence. I will likely give him 2 more months (he had neoadjuvant chemo for 4 months). The benefit and risks were discussed with him. He agrees to proceed. -The goal of therapy is curative.  -I previously discussed with radiation oncologist Dr. Lisbeth Renshaw and he does not recommend adjuvant radiation   -He has started weekly gemcitabine, developed skin rash after first dose, resolved with steroids. Possible related to gemcitabine, although he had 4 months of gemcitabine and Abraxane before surgery without any skin rash or other allergy reactions. -Lab previously reviewed, adequate for treatment, we'll proceed gemcitabine today, with premedication dexamethasone 10 mg, he will also take dexa 2mg  daily for 3-5 days after chemo to prevent skin rash -Xeloda was finally approved by his insurance after appeal. However his wife request to use her insurance to cover the Xeloda. It was finally approved and delivered. Due to the pending restaging scan, I'll hold for this cycle for now. -Tumor marker is elevating as CA 19-9 is 261 (4/23), concerning for cancer recurrence. -Will do a CT CAP Scan with Contrast in the next 2 weeks to further investigate -labs reviewed and discussed and will continue with cycle 5 gemcitabine today. If he does have recurrence, I'll likely switch his treatment to FOLFOX.  2. HTN and DM - he will continue medication and follow up with his primary care physician  -We again reviewed that his blood pressure and blood glucose will need to be monitored closely during the chemotherapy, and his medication may need to be adjusted  -His sugar has been increasing due to the steroids. Still, fasting values not above 150-200. I previously encouraged him to check more frequently, and cover with short acting insulin. -Today (5/7) his Blood sugar was 140 before eating and 262 at clinic after eating.  3. Malnutrition and weight loss -He has previously lost 6 lbs in a week -he was previously seen by our  dietitian Pamala Hurry and will follow up. -We'll watch his weight and nutrition status closely when he is on chemotherapy. -He is on TPN and has a PEG tube. The patient states he is not using his PEG tube and is not taking Glucerna due to fear of spiking his sugar. -He only eats 2 small meals a day with 2 cans of Ensure. I previously encouraged him to eat more, or increase his Ensure intake. -He will talk to our nutritionist again.  -he is off TPN now -I previously encouraged him to eat 4-5 small meals a day.   4. Depression -The patient is depressed about his diagnosis. -We again discussed the patient speaking with social work and the chaplain for which he has their numbers. -He previously tried mirtazapine, but did not like it and has stopped .  5. Skin Rash -Possibly related to gemcitabine  -Started previously on steroids. Rash resolved now  -Skin itches after treatment but no rash, he will take additional dexa after chemo to prevent rash  Plan -Lab reviewed, adequate for treatment, we'll proceed gemcitabine today, with premedication dexamethasone 10 mg. Take steroid for 3-5 days to help prevent skin rash and itching  -he is still holding Xeloda, 1000mg  BID until next cycle, due to the pending staging scan   -Labs, flush, and chemo gemcitabine 5/14 or 5/15 -f/u in 2 weeks With CT CAP with Contrast before next visit, to see if  we need to change his treatment    All questions were answered. The patient knows to call the clinic with any problems, questions or concerns.  I spent 20 minutes counseling the patient face to face. The total time spent in the appointment was 25 minutes and more than 50% was on counseling.  This document serves as a record of services personally performed by Truitt Merle, MD. It was created on her behalf by Joslyn Devon, a trained medical scribe. The creation of this record is based on the scribe's personal observations and the provider's statements to them. This  document has been checked and approved by the attending provider.   I have reviewed the above documentation for accuracy and completeness and I agree with the above.   Truitt Merle, MD 12/15/2016

## 2016-12-15 ENCOUNTER — Ambulatory Visit: Payer: BLUE CROSS/BLUE SHIELD | Admitting: Nutrition

## 2016-12-15 ENCOUNTER — Other Ambulatory Visit (HOSPITAL_BASED_OUTPATIENT_CLINIC_OR_DEPARTMENT_OTHER): Payer: BLUE CROSS/BLUE SHIELD

## 2016-12-15 ENCOUNTER — Telehealth: Payer: Self-pay | Admitting: Hematology

## 2016-12-15 ENCOUNTER — Ambulatory Visit (HOSPITAL_BASED_OUTPATIENT_CLINIC_OR_DEPARTMENT_OTHER): Payer: BLUE CROSS/BLUE SHIELD

## 2016-12-15 ENCOUNTER — Ambulatory Visit (HOSPITAL_BASED_OUTPATIENT_CLINIC_OR_DEPARTMENT_OTHER): Payer: BLUE CROSS/BLUE SHIELD | Admitting: Hematology

## 2016-12-15 ENCOUNTER — Ambulatory Visit: Payer: BLUE CROSS/BLUE SHIELD

## 2016-12-15 ENCOUNTER — Encounter: Payer: Self-pay | Admitting: Hematology

## 2016-12-15 VITALS — BP 136/70 | HR 60 | Temp 98.7°F | Resp 20 | Ht 71.0 in | Wt 162.0 lb

## 2016-12-15 VITALS — BP 132/82 | HR 69 | Temp 98.4°F | Resp 18

## 2016-12-15 DIAGNOSIS — C259 Malignant neoplasm of pancreas, unspecified: Secondary | ICD-10-CM

## 2016-12-15 DIAGNOSIS — Z5111 Encounter for antineoplastic chemotherapy: Secondary | ICD-10-CM | POA: Diagnosis not present

## 2016-12-15 DIAGNOSIS — C250 Malignant neoplasm of head of pancreas: Secondary | ICD-10-CM | POA: Diagnosis not present

## 2016-12-15 DIAGNOSIS — Z95828 Presence of other vascular implants and grafts: Secondary | ICD-10-CM

## 2016-12-15 DIAGNOSIS — F329 Major depressive disorder, single episode, unspecified: Secondary | ICD-10-CM

## 2016-12-15 DIAGNOSIS — R634 Abnormal weight loss: Secondary | ICD-10-CM

## 2016-12-15 DIAGNOSIS — E46 Unspecified protein-calorie malnutrition: Secondary | ICD-10-CM | POA: Diagnosis not present

## 2016-12-15 LAB — COMPREHENSIVE METABOLIC PANEL
ALBUMIN: 3 g/dL — AB (ref 3.5–5.0)
ALK PHOS: 100 U/L (ref 40–150)
ALT: 27 U/L (ref 0–55)
AST: 23 U/L (ref 5–34)
Anion Gap: 6 mEq/L (ref 3–11)
BUN: 3.1 mg/dL — ABNORMAL LOW (ref 7.0–26.0)
CO2: 25 mEq/L (ref 22–29)
Calcium: 8.4 mg/dL (ref 8.4–10.4)
Chloride: 107 mEq/L (ref 98–109)
Creatinine: 0.8 mg/dL (ref 0.7–1.3)
GLUCOSE: 262 mg/dL — AB (ref 70–140)
POTASSIUM: 3.7 meq/L (ref 3.5–5.1)
SODIUM: 139 meq/L (ref 136–145)
Total Bilirubin: 0.36 mg/dL (ref 0.20–1.20)
Total Protein: 5.4 g/dL — ABNORMAL LOW (ref 6.4–8.3)

## 2016-12-15 LAB — CBC WITH DIFFERENTIAL/PLATELET
BASO%: 0.4 % (ref 0.0–2.0)
BASOS ABS: 0 10*3/uL (ref 0.0–0.1)
EOS%: 3.9 % (ref 0.0–7.0)
Eosinophils Absolute: 0.2 10*3/uL (ref 0.0–0.5)
HCT: 30.1 % — ABNORMAL LOW (ref 38.4–49.9)
HEMOGLOBIN: 9.9 g/dL — AB (ref 13.0–17.1)
LYMPH%: 26 % (ref 14.0–49.0)
MCH: 26.3 pg — AB (ref 27.2–33.4)
MCHC: 32.8 g/dL (ref 32.0–36.0)
MCV: 80.1 fL (ref 79.3–98.0)
MONO#: 0.4 10*3/uL (ref 0.1–0.9)
MONO%: 10.4 % (ref 0.0–14.0)
NEUT#: 2.5 10*3/uL (ref 1.5–6.5)
NEUT%: 59.3 % (ref 39.0–75.0)
Platelets: 303 10*3/uL (ref 140–400)
RBC: 3.76 10*6/uL — AB (ref 4.20–5.82)
RDW: 16.8 % — ABNORMAL HIGH (ref 11.0–14.6)
WBC: 4.3 10*3/uL (ref 4.0–10.3)
lymph#: 1.1 10*3/uL (ref 0.9–3.3)

## 2016-12-15 LAB — TECHNOLOGIST REVIEW

## 2016-12-15 MED ORDER — DEXAMETHASONE SODIUM PHOSPHATE 10 MG/ML IJ SOLN
INTRAMUSCULAR | Status: AC
  Filled 2016-12-15: qty 1

## 2016-12-15 MED ORDER — GEMCITABINE HCL CHEMO INJECTION 1 GM/26.3ML
1000.0000 mg/m2 | Freq: Once | INTRAVENOUS | Status: AC
  Administered 2016-12-15: 1976 mg via INTRAVENOUS
  Filled 2016-12-15: qty 51.97

## 2016-12-15 MED ORDER — SODIUM CHLORIDE 0.9 % IV SOLN
Freq: Once | INTRAVENOUS | Status: AC
  Administered 2016-12-15: 11:00:00 via INTRAVENOUS

## 2016-12-15 MED ORDER — PROCHLORPERAZINE MALEATE 10 MG PO TABS
ORAL_TABLET | ORAL | Status: AC
  Filled 2016-12-15: qty 1

## 2016-12-15 MED ORDER — DEXAMETHASONE SODIUM PHOSPHATE 10 MG/ML IJ SOLN
10.0000 mg | Freq: Once | INTRAMUSCULAR | Status: AC
Start: 2016-12-15 — End: 2016-12-15
  Administered 2016-12-15: 10 mg via INTRAVENOUS

## 2016-12-15 MED ORDER — SODIUM CHLORIDE 0.9 % IJ SOLN
10.0000 mL | INTRAMUSCULAR | Status: DC | PRN
  Administered 2016-12-15: 10 mL via INTRAVENOUS
  Filled 2016-12-15: qty 10

## 2016-12-15 MED ORDER — PROCHLORPERAZINE MALEATE 10 MG PO TABS
10.0000 mg | ORAL_TABLET | Freq: Once | ORAL | Status: AC
  Administered 2016-12-15: 10 mg via ORAL

## 2016-12-15 MED ORDER — HEPARIN SOD (PORK) LOCK FLUSH 100 UNIT/ML IV SOLN
500.0000 [IU] | Freq: Once | INTRAVENOUS | Status: AC | PRN
  Administered 2016-12-15: 500 [IU]
  Filled 2016-12-15: qty 5

## 2016-12-15 MED ORDER — SODIUM CHLORIDE 0.9% FLUSH
10.0000 mL | INTRAVENOUS | Status: DC | PRN
  Administered 2016-12-15: 10 mL
  Filled 2016-12-15: qty 10

## 2016-12-15 NOTE — Patient Instructions (Signed)
[facility] Discharge Instructions for Patients Receiving Chemotherapy  Today you received the following chemotherapy agents Gemzar.  To help prevent nausea and vomiting after your treatment, we encourage you to take your nausea medication.   If you develop nausea and vomiting that is not controlled by your nausea medication, call the clinic.   BELOW ARE SYMPTOMS THAT SHOULD BE REPORTED IMMEDIATELY:  *FEVER GREATER THAN 100.5 F  *CHILLS WITH OR WITHOUT FEVER  NAUSEA AND VOMITING THAT IS NOT CONTROLLED WITH YOUR NAUSEA MEDICATION  *UNUSUAL SHORTNESS OF BREATH  *UNUSUAL BRUISING OR BLEEDING  TENDERNESS IN MOUTH AND THROAT WITH OR WITHOUT PRESENCE OF ULCERS  *URINARY PROBLEMS  *BOWEL PROBLEMS  UNUSUAL RASH Items with * indicate a potential emergency and should be followed up as soon as possible.  Feel free to call the clinic you have any questions or concerns. The clinic phone number is [phone].  Please show the CHEMO ALERT CARD at check-in to the Emergency Department and triage nurse.   

## 2016-12-15 NOTE — Patient Instructions (Signed)
Implanted Port Home Guide An implanted port is a type of central line that is placed under the skin. Central lines are used to provide IV access when treatment or nutrition needs to be given through a person's veins. Implanted ports are used for long-term IV access. An implanted port may be placed because:  You need IV medicine that would be irritating to the small veins in your hands or arms.  You need long-term IV medicines, such as antibiotics.  You need IV nutrition for a long period.  You need frequent blood draws for lab tests.  You need dialysis.  Implanted ports are usually placed in the chest area, but they can also be placed in the upper arm, the abdomen, or the leg. An implanted port has two main parts:  Reservoir. The reservoir is round and will appear as a small, raised area under your skin. The reservoir is the part where a needle is inserted to give medicines or draw blood.  Catheter. The catheter is a thin, flexible tube that extends from the reservoir. The catheter is placed into a large vein. Medicine that is inserted into the reservoir goes into the catheter and then into the vein.  How will I care for my incision site? Do not get the incision site wet. Bathe or shower as directed by your health care provider. How is my port accessed? Special steps must be taken to access the port:  Before the port is accessed, a numbing cream can be placed on the skin. This helps numb the skin over the port site.  Your health care provider uses a sterile technique to access the port. ? Your health care provider must put on a mask and sterile gloves. ? The skin over your port is cleaned carefully with an antiseptic and allowed to dry. ? The port is gently pinched between sterile gloves, and a needle is inserted into the port.  Only "non-coring" port needles should be used to access the port. Once the port is accessed, a blood return should be checked. This helps ensure that the port  is in the vein and is not clogged.  If your port needs to remain accessed for a constant infusion, a clear (transparent) bandage will be placed over the needle site. The bandage and needle will need to be changed every week, or as directed by your health care provider.  Keep the bandage covering the needle clean and dry. Do not get it wet. Follow your health care provider's instructions on how to take a shower or bath while the port is accessed.  If your port does not need to stay accessed, no bandage is needed over the port.  What is flushing? Flushing helps keep the port from getting clogged. Follow your health care provider's instructions on how and when to flush the port. Ports are usually flushed with saline solution or a medicine called heparin. The need for flushing will depend on how the port is used.  If the port is used for intermittent medicines or blood draws, the port will need to be flushed: ? After medicines have been given. ? After blood has been drawn. ? As part of routine maintenance.  If a constant infusion is running, the port may not need to be flushed.  How long will my port stay implanted? The port can stay in for as long as your health care provider thinks it is needed. When it is time for the port to come out, surgery will be   done to remove it. The procedure is similar to the one performed when the port was put in. When should I seek immediate medical care? When you have an implanted port, you should seek immediate medical care if:  You notice a bad smell coming from the incision site.  You have swelling, redness, or drainage at the incision site.  You have more swelling or pain at the port site or the surrounding area.  You have a fever that is not controlled with medicine.  This information is not intended to replace advice given to you by your health care provider. Make sure you discuss any questions you have with your health care provider. Document  Released: 07/28/2005 Document Revised: 01/03/2016 Document Reviewed: 04/04/2013 Elsevier Interactive Patient Education  2017 Elsevier Inc.  

## 2016-12-15 NOTE — Progress Notes (Signed)
Nutrition follow-up completed with patient receiving chemotherapy for pancreas cancer status post Whipple. Weight is stable and documented as 162 pounds May 7. Albumin 3.3, glucose 262. Patient reports he continues to try to eat frequently and chooses high-calorie, high-protein foods. Patient really does not care for oral nutrition supplements.  Nutrition diagnosis: Unintended weight loss continues.  Intervention: Encouraged patient to consume oral nutrition supplements in small amounts throughout the day by adding fruit or ice cream to the shake. Sample of strawberry ensure Enlive with patient.  He did not care for it. Reviewed other options for increasing calories and protein. Patient has good understanding of the importance of adequate calories and protein for weight maintenance.  Monitoring, evaluation, goals: Patient will work to increase calories and protein to minimize further weight loss.  Next visit: To be scheduled as needed.  **Disclaimer: This note was dictated with voice recognition software. Similar sounding words can inadvertently be transcribed and this note may contain transcription errors which may not have been corrected upon publication of note.**

## 2016-12-15 NOTE — Telephone Encounter (Signed)
Appointments scheduled per 12/15/16 los. Patient was given a copy of the AVS report and appointment schedule, per 12/15/16 los.

## 2016-12-15 NOTE — Telephone Encounter (Signed)
2 bottles of contrast given to patient with a copy of instructions per 12/15/16 los.

## 2016-12-22 ENCOUNTER — Ambulatory Visit (HOSPITAL_BASED_OUTPATIENT_CLINIC_OR_DEPARTMENT_OTHER): Payer: BLUE CROSS/BLUE SHIELD

## 2016-12-22 ENCOUNTER — Other Ambulatory Visit: Payer: Self-pay | Admitting: Hematology

## 2016-12-22 ENCOUNTER — Other Ambulatory Visit (HOSPITAL_BASED_OUTPATIENT_CLINIC_OR_DEPARTMENT_OTHER): Payer: BLUE CROSS/BLUE SHIELD

## 2016-12-22 VITALS — BP 154/79 | HR 67 | Temp 98.5°F | Resp 18

## 2016-12-22 DIAGNOSIS — C250 Malignant neoplasm of head of pancreas: Secondary | ICD-10-CM | POA: Diagnosis not present

## 2016-12-22 DIAGNOSIS — Z5111 Encounter for antineoplastic chemotherapy: Secondary | ICD-10-CM | POA: Diagnosis not present

## 2016-12-22 DIAGNOSIS — C259 Malignant neoplasm of pancreas, unspecified: Secondary | ICD-10-CM

## 2016-12-22 LAB — CBC WITH DIFFERENTIAL/PLATELET
BASO%: 0.9 % (ref 0.0–2.0)
Basophils Absolute: 0 10*3/uL (ref 0.0–0.1)
EOS%: 2.3 % (ref 0.0–7.0)
Eosinophils Absolute: 0.1 10*3/uL (ref 0.0–0.5)
HEMATOCRIT: 29.2 % — AB (ref 38.4–49.9)
HGB: 9.7 g/dL — ABNORMAL LOW (ref 13.0–17.1)
LYMPH#: 1.1 10*3/uL (ref 0.9–3.3)
LYMPH%: 51.4 % — ABNORMAL HIGH (ref 14.0–49.0)
MCH: 26.4 pg — AB (ref 27.2–33.4)
MCHC: 33.2 g/dL (ref 32.0–36.0)
MCV: 79.6 fL (ref 79.3–98.0)
MONO#: 0.2 10*3/uL (ref 0.1–0.9)
MONO%: 10.6 % (ref 0.0–14.0)
NEUT%: 34.8 % — AB (ref 39.0–75.0)
NEUTROS ABS: 0.8 10*3/uL — AB (ref 1.5–6.5)
NRBC: 0 % (ref 0–0)
Platelets: 188 10*3/uL (ref 140–400)
RBC: 3.67 10*6/uL — ABNORMAL LOW (ref 4.20–5.82)
RDW: 15.7 % — ABNORMAL HIGH (ref 11.0–14.6)
WBC: 2.2 10*3/uL — ABNORMAL LOW (ref 4.0–10.3)

## 2016-12-22 LAB — COMPREHENSIVE METABOLIC PANEL
ALBUMIN: 3.1 g/dL — AB (ref 3.5–5.0)
ALK PHOS: 110 U/L (ref 40–150)
ALT: 27 U/L (ref 0–55)
AST: 19 U/L (ref 5–34)
Anion Gap: 8 mEq/L (ref 3–11)
BILIRUBIN TOTAL: 0.27 mg/dL (ref 0.20–1.20)
BUN: 2.9 mg/dL — AB (ref 7.0–26.0)
CALCIUM: 8.8 mg/dL (ref 8.4–10.4)
CO2: 28 mEq/L (ref 22–29)
CREATININE: 0.7 mg/dL (ref 0.7–1.3)
Chloride: 108 mEq/L (ref 98–109)
EGFR: >90 mL/min/{1.73_m2} (ref >90)
GLUCOSE: 48 mg/dL — AB (ref 70–140)
Potassium: 2.8 mEq/L — CL (ref 3.5–5.1)
SODIUM: 144 meq/L (ref 136–145)
TOTAL PROTEIN: 6.1 g/dL — AB (ref 6.4–8.3)

## 2016-12-22 MED ORDER — POTASSIUM CHLORIDE CRYS ER 20 MEQ PO TBCR
40.0000 meq | EXTENDED_RELEASE_TABLET | Freq: Once | ORAL | Status: AC
  Administered 2016-12-22: 40 meq via ORAL
  Filled 2016-12-22: qty 2

## 2016-12-22 MED ORDER — SODIUM CHLORIDE 0.9 % IV SOLN
800.0000 mg/m2 | Freq: Once | INTRAVENOUS | Status: AC
  Administered 2016-12-22: 1558 mg via INTRAVENOUS
  Filled 2016-12-22: qty 40.98

## 2016-12-22 MED ORDER — SODIUM CHLORIDE 0.9 % IV SOLN
Freq: Once | INTRAVENOUS | Status: AC
  Administered 2016-12-22: 14:00:00 via INTRAVENOUS

## 2016-12-22 MED ORDER — SODIUM CHLORIDE 0.9% FLUSH
10.0000 mL | INTRAVENOUS | Status: DC | PRN
  Administered 2016-12-22: 10 mL
  Filled 2016-12-22: qty 10

## 2016-12-22 MED ORDER — DEXAMETHASONE SODIUM PHOSPHATE 10 MG/ML IJ SOLN
10.0000 mg | Freq: Once | INTRAMUSCULAR | Status: AC
  Administered 2016-12-22: 10 mg via INTRAVENOUS

## 2016-12-22 MED ORDER — PROCHLORPERAZINE MALEATE 10 MG PO TABS
ORAL_TABLET | ORAL | Status: AC
  Filled 2016-12-22: qty 1

## 2016-12-22 MED ORDER — HEPARIN SOD (PORK) LOCK FLUSH 100 UNIT/ML IV SOLN
500.0000 [IU] | Freq: Once | INTRAVENOUS | Status: AC | PRN
  Administered 2016-12-22: 500 [IU]
  Filled 2016-12-22: qty 5

## 2016-12-22 MED ORDER — PROCHLORPERAZINE MALEATE 10 MG PO TABS
10.0000 mg | ORAL_TABLET | Freq: Once | ORAL | Status: AC
  Administered 2016-12-22: 10 mg via ORAL

## 2016-12-22 MED ORDER — DEXAMETHASONE SODIUM PHOSPHATE 10 MG/ML IJ SOLN
INTRAMUSCULAR | Status: AC
  Filled 2016-12-22: qty 1

## 2016-12-22 NOTE — Progress Notes (Signed)
Spoke with pt regarding low Neut # and importance of washing hands and avoiding sick people.

## 2016-12-22 NOTE — Progress Notes (Signed)
Per Dr Burr Medico ok to tx today, will give pt 40 meq of oral K in tx area. Pt to increase oral K Rx at home to 40 meq daily (2 tablets)  until he sees Dr Burr Medico next Monday, explained to pt, pt verbalized understanding.

## 2016-12-22 NOTE — Patient Instructions (Signed)
[facility] Discharge Instructions for Patients Receiving Chemotherapy  Today you received the following chemotherapy agents Gemzar.  To help prevent nausea and vomiting after your treatment, we encourage you to take your nausea medication.   If you develop nausea and vomiting that is not controlled by your nausea medication, call the clinic.   BELOW ARE SYMPTOMS THAT SHOULD BE REPORTED IMMEDIATELY:  *FEVER GREATER THAN 100.5 F  *CHILLS WITH OR WITHOUT FEVER  NAUSEA AND VOMITING THAT IS NOT CONTROLLED WITH YOUR NAUSEA MEDICATION  *UNUSUAL SHORTNESS OF BREATH  *UNUSUAL BRUISING OR BLEEDING  TENDERNESS IN MOUTH AND THROAT WITH OR WITHOUT PRESENCE OF ULCERS  *URINARY PROBLEMS  *BOWEL PROBLEMS  UNUSUAL RASH Items with * indicate a potential emergency and should be followed up as soon as possible.  Feel free to call the clinic you have any questions or concerns. The clinic phone number is [phone].  Please show the CHEMO ALERT CARD at check-in to the Emergency Department and triage nurse.   

## 2016-12-23 LAB — CANCER ANTIGEN 19-9: CAN 19-9: 930 U/mL — AB (ref 0–35)

## 2016-12-24 ENCOUNTER — Ambulatory Visit (HOSPITAL_COMMUNITY): Admission: RE | Admit: 2016-12-24 | Payer: BLUE CROSS/BLUE SHIELD | Source: Ambulatory Visit

## 2016-12-25 ENCOUNTER — Ambulatory Visit (HOSPITAL_COMMUNITY)
Admission: RE | Admit: 2016-12-25 | Discharge: 2016-12-25 | Disposition: A | Discharge status: Home / Self Care | Payer: BLUE CROSS/BLUE SHIELD | Source: Ambulatory Visit | Attending: Hematology | Admitting: Hematology

## 2016-12-25 DIAGNOSIS — C787 Secondary malignant neoplasm of liver and intrahepatic bile duct: Secondary | ICD-10-CM | POA: Insufficient documentation

## 2016-12-25 DIAGNOSIS — I2510 Atherosclerotic heart disease of native coronary artery without angina pectoris: Secondary | ICD-10-CM | POA: Diagnosis not present

## 2016-12-25 DIAGNOSIS — C259 Malignant neoplasm of pancreas, unspecified: Secondary | ICD-10-CM | POA: Insufficient documentation

## 2016-12-25 DIAGNOSIS — Z90411 Acquired partial absence of pancreas: Secondary | ICD-10-CM | POA: Insufficient documentation

## 2016-12-25 DIAGNOSIS — I700 Atherosclerosis of aorta: Secondary | ICD-10-CM | POA: Insufficient documentation

## 2016-12-25 MED ORDER — IOPAMIDOL (ISOVUE-300) INJECTION 61%
INTRAVENOUS | Status: AC
  Administered 2016-12-25: 100 mL
  Filled 2016-12-25: qty 100

## 2016-12-26 NOTE — Progress Notes (Signed)
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 Question loss of fat plane between the posterior aspect of mass and IVC. Mildly dilated CBD 10 mm.      03/13/2016 Procedure    ERCP and medical stem placement in CBD      03/20/2016 Initial Diagnosis    Primary pancreatic adenocarcinoma ([place])      03/20/2016 Initial Biopsy    FNA of the pancreatic head mass from EUS showed a malignant cells consistent with adenocarcinoma      03/20/2016 Procedure    EUS showed an irregular mass in the pancreatic head, measuring 3.9 x 3.2 cm, no invading of the celiac trunk or portal vein. The mass was biopsied.       04/09/2016 -  Neo-Adjuvant Chemotherapy    Gemcitabine and Abraxane on Day 1, 8 every 21 days       06/05/2016 Imaging    CT CAP w Contrast 06/05/16 IMPRESSION: Mildly decreased size of mass within pancreatic head and uncinateprocess, with common bile duct stent in appropriate position. No evidence of metastatic disease or other acute findings within thechest, abdomen,  or pelvis.      08/21/2016 Surgery    Whipple surgery by Dr. Barry Dienes       08/21/2016 Pathology Results    Whipple surgery showed ampullary invasive adenocarcinoma, moderately differentiated, 2.8 cm, tumor invades peripancreatic tissue and pancreas more than 0.5 cm, resection margins are negative, metastatic carcinoma in 1 of 16 lymph nodes. Biopsy of the liver and portal lymph nodes are negative.       08/31/2016 Imaging    CT CAP w Contrast 08/31/16 IMPRESSION: 1. Status post classic appearing Whipple procedure by dehiscence of suture sites. Marked gastric distention with fluid and food may reflect delayed gastric emptying. Fluid-filled jejunum has the appearance of postoperative ileus. No definite transition site is identified. 2. No sterile fluid collection or abscess. There are mottled gas like lucencies in the gallbladder fossa which may represent postop change.      12/25/2016 Imaging    CT CAP W Contrast 12/25/16 IMPRESSION: 1. Today's study demonstrates evidence of new widespread metastatic disease to the liver, as discussed above. 2. No definite metastatic disease noted elsewhere in the chest or pelvis. 3. Status post Whipple procedure. 4. Aortic atherosclerosis, in addition to 2 vessel coronary artery disease. Please note that although the presence of coronary artery calcium documents the presence of coronary artery disease, the severity of this disease and any potential stenosis cannot be assessed on this non-gated CT examination. Assessment for potential risk factor modification, dietary  therapy or pharmacologic therapy may be warranted, if clinically indicated. 5. Additional incidental findings, as above.        HISTORY OF PRESENTING ILLNESS:  Craig [name] 61 y.o. male is here because of His newly diagnosed pancreatic cancer. He is coming up by his wife to our multidisciplinary check clinic today.  He has had epigastric pain for the last 4-6 weeks. He describes  the pain is intermittent, last a few hours and a few times a day, located in the low mid chest and upper abdomen, not related to eating or position. He denies nausea, cough, or dyspnea. His appetite has decreased lately, and he complains about moderate fatigue, he was not able to tolerate his physically demanding job, and he has been out of week for 3 weeks. He lost about 30-40 lbs in the past 2 months.   He was seen by his primary care physician a few times, and he developed jaundice, and abnormal liver function. He was sent to Hospital by his primary care physician. During his hospital stay, CT scan reviewed a pancreatic head mass, and mild dilatation of bile duct. He was seen by GI Dr. Jacelyn Grip, underwent ERCP and metal stent placement in CBD. He subsequently underwent EUS which showed an irregular mass in the pancreatic head, measuring 3.9 cm, no invading of the celiac trunk or portal vein. The mass was biopsied, which showed adenocarcinoma.  He is married, lives with his wife. His abdominal pain, fatigue has not changed much since the CBD stent placement. He is able to tolerate routine activities, no other new complaints. He has been drinking beer moderately for the past 40 years, but stopped a few weeks ago.   CURRENT THERAPY: adjuvant chemotherapy with Gemcitabine 1000mg /m2 on day 1, 8 every 21 days, started on 10/24/2016, pending Xeloda. Due to cancer progression we will change to CAPOX 3 weeks on and 1 week off.    INTERIM HISTORY:  Craig [name] returns for follow-up and treatment. He presents to the clinic today reporting pain in epigastric region 4/10 that comes and goes. He has not taking tylenol and does not like taking oxycodone but will if he needs it. He is not surprised about his cancer coming back because he feels it never left. He continued to have pain and never gain his weight back. He will talk to his wife about this new development. He is currently not wanting to talk with anyone although  it was offered.     MEDICAL HISTORY:  Past Medical History:  Diagnosis Date  . Diabetes mellitus without complication ([place])   . ETOH abuse   . Hypertension    off bp meds 3 4- months ago  . Primary pancreatic adenocarcinoma ([place]) 03/28/2016  . Type 2 diabetes mellitus ([place])     SURGICAL HISTORY: Past Surgical History:  Procedure Laterality Date  . ERCP Left 03/13/2016   Procedure: ENDOSCOPIC RETROGRADE CHOLANGIOPANCREATOGRAPHY (ERCP);  Surgeon: Carol Ada, MD;  Location: [facility]COPY;  Service: Endoscopy;  Laterality: Left;  . EUS N/A 03/20/2016   Procedure: UPPER ENDOSCOPIC ULTRASOUND (EUS) LINEAR;  Surgeon: Carol Ada, MD;  Location: [facility] ENDOSCOPY;  Service: Endoscopy;  Laterality: N/A;  . HERNIA REPAIR    . KNEE ARTHROSCOPY     left and right  . LAPAROSCOPY N/A 08/21/2016   Procedure: LAPAROSCOPY DIAGNOSTIC;  Surgeon: Stark Klein, MD;  Location: [facility] ORS;  Service: General;  Laterality: N/A;  . PORTACATH PLACEMENT N/A 04/07/2016   Procedure: INSERTION PORT-A-CATH;  Surgeon: Dorris Fetch  Barry Dienes, MD;  Location: [facility];  Service: General;  Laterality: N/A;  . WHIPPLE PROCEDURE N/A 08/21/2016   Procedure: WHIPPLE PROCEDURE;  Surgeon: Stark Klein, MD;  Location: [facility] ORS;  Service: General;  Laterality: N/A;    SOCIAL HISTORY: Social History   Social History  . Marital status: Married    Spouse name: N/A  . Number of children: N/A  . Years of education: N/A   Occupational History  . Not on file.   Social History Main Topics  . Smoking status: Former Smoker    Packs/day: 1.00    Years: 15.00    Quit date: 08/12/1995  . Smokeless tobacco: Never Used  . Alcohol use Yes     Comment: he used to drink 6 pack beer a day for 40 years, quit in 02/2016   . Drug use: No  . Sexual activity: Not on file   Other Topics Concern  . Not on file   Social History Narrative  . No narrative on file    FAMILY HISTORY: Family History  Problem Relation Age of Onset  . Heart attack Mother      ALLERGIES:  is allergic to no known allergies.  MEDICATIONS:  Current Outpatient Prescriptions  Medication Sig Dispense Refill  . betamethasone dipropionate (DIPROLENE) 0.05 % cream Apply topically 2 (two) times daily. 30 g 0  . bisacodyl (DULCOLAX) 10 MG suppository Place 1 suppository (10 mg total) rectally daily as needed for moderate constipation. 12 suppository 0  . folic acid (FOLVITE) 1 MG tablet TAKE ONE TABLET BY MOUTH DAILY 30 tablet 0  . Insulin Glargine (LANTUS SOLOSTAR) 100 UNIT/ML Solostar Pen Inject 16 Units into the skin every morning. 15 mL 11  . lidocaine-prilocaine (EMLA) cream Apply to affected area once 30 g 3  . LORazepam (ATIVAN) 1 MG tablet Take 0.5-1 tablets (0.5-1 mg total) by mouth 3 (three) times daily as needed for anxiety. 15 tablet 0  . methocarbamol (ROBAXIN) 500 MG tablet Take 1 tablet (500 mg total) by mouth every 8 (eight) hours as needed for muscle spasms. 90 tablet 2  . metoCLOPramide (REGLAN) 10 MG tablet Take 1 tablet (10 mg total) by mouth 4 (four) times daily. 120 tablet 3  . metoprolol tartrate (LOPRESSOR) 25 MG tablet Take 0.5 tablets (12.5 mg total) by mouth 2 (two) times daily. (Patient taking differently: Take 12.5 mg by mouth daily. ) 60 tablet 1  . mirtazapine (REMERON) 15 MG tablet Take 1 tablet (15 mg total) by mouth at bedtime. 30 tablet 1  . ondansetron (ZOFRAN) 8 MG tablet Take 1 tablet (8 mg total) by mouth every 8 (eight) hours as needed (Nausea or vomiting). 30 tablet 1  . oxyCODONE (OXY IR/ROXICODONE) 5 MG immediate release tablet Take 1-2 tablets (5-10 mg total) by mouth every 4 (four) hours as needed for moderate pain, severe pain or breakthrough pain. 60 tablet 0  . Pancrelipase, Lip-Prot-Amyl, (ZENPEP PO) Take 2 capsules by mouth 3 (three) times daily.    . pantoprazole (PROTONIX) 40 MG tablet Take 1 tablet (40 mg total) by mouth daily. 30 tablet 3  . potassium chloride SA (K-DUR,KLOR-CON) 20 MEQ tablet Take 1 tablet (20 mEq total)  by mouth daily. 30 tablet 1  . prochlorperazine (COMPAZINE) 10 MG tablet Take 1 tablet (10 mg total) by mouth every 8 (eight) hours as needed (Nausea or vomiting). 30 tablet 1  . simethicone (MYLICON) 80 MG chewable tablet Chew 1 tablet (80 mg total) by mouth 3 (three)  times daily. 30 tablet 0  . thiamine 100 MG tablet Take 1 tablet (100 mg total) by mouth daily. 30 tablet 0  . capecitabine (XELODA) 500 MG tablet Take 3 tablets (1,500 mg total) by mouth 2 (two) times daily after a meal. For 14 days on, 7 days off (Patient not taking: Reported on 11/17/2016) 84 tablet 2  . Nutritional Supplements (FEEDING SUPPLEMENT, VITAL AF 1.2 CAL,) LIQD Place 1,000 mLs into feeding tube continuous. 5000 mL 12   No current facility-administered medications for this visit.    REVIEW OF SYSTEMS:  Constitutional: Denies fevers, chills or abnormal night sweats (+) loss of appetite (+) weight loss  Eyes: Denies blurriness of vision, double vision or watery eyes Ears, nose, mouth, throat, and face: Denies mucositis or sore throat  Respiratory: Denies cough, dyspnea or wheezes Cardiovascular: Denies palpitation, chest discomfort or lower extremity swelling Gastrointestinal:  Denies nausea, heartburn (+) regular bowel movements with medication along with meal (+) epigastric abdominal pain Skin: Denies abnormal skin rashes (+) itching, whole body Lymphatics: Denies new lymphadenopathy or easy bruising Neurological:Denies numbness, tingling or new weaknesses Behavioral/Psych: Mood is stable, no new changes  All other systems were reviewed with the patient and are negative.  PHYSICAL EXAMINATION: ECOG PERFORMANCE STATUS: 1 - Symptomatic but completely ambulatory  Vitals:   12/29/16 1323  BP: (!) 153/77  Pulse: 64  Resp: 20  Temp: 98.8 F (37.1 C)  TempSrc: Oral  SpO2: 100%  Weight: 162 lb 1.6 oz (73.5 kg)  Height: 5\' 11"  (1.803 m)     GENERAL:alert, no distress and comfortable. In treatment chair. SKIN:  skin color, texture, turgor are normal, no rashes or significant lesions, Mild skin pigmentation so his trunk, from his previous skin rash last week EYES: normal, conjunctiva are pink and non-injected, sclera clear OROPHARYNX:no exudate, no erythema and lips, buccal mucosa, and tongue normal  NECK: supple, thyroid normal size, non-tender, without nodularity LYMPH:  no palpable lymphadenopathy in the cervical, axillary or inguinal LUNGS: clear to auscultation and percussion with normal breathing effort HEART: regular rate & rhythm and no murmurs and no lower extremity edema ABDOMEN: benign, soft, non-tender  Musculoskeletal:no cyanosis of digits and no clubbing  Extremities: no edema, PICC line has been removed  PSYCH: alert & oriented x 3 with fluent speech NEURO: no focal motor/sensory deficits  LABORATORY DATA:  I have reviewed the data as listed CBC Latest Ref Rng & Units 12/22/2016 12/15/2016 12/01/2016  WBC 4.0 - 10.3 10e3/uL 2.2(L) 4.3 4.0  Hemoglobin 13.0 - 17.1 g/dL 9.7(L) 9.9(L) 11.3(L)  Hematocrit 38.4 - 49.9 % 29.2(L) 30.1(L) 34.5(L)  Platelets 140 - 400 10e3/uL 188 303 184   CMP Latest Ref Rng & Units 12/22/2016 12/15/2016 12/01/2016  Glucose 70 - 140 mg/dl 48(L) 262(H) 120  BUN 7.0 - 26.0 mg/dL 2.9(L) 3.1(L) 3.9(L)  Creatinine 0.7 - 1.3 mg/dL 0.7 0.8 0.9  Sodium 136 - 145 mEq/L 144 139 142  Potassium 3.5 - 5.1 mEq/L 2.8(LL) 3.7 4.1  Chloride 101 - 111 mmol/L - - -  CO2 22 - 29 mEq/L 28 25 27   Calcium 8.4 - 10.4 mg/dL 8.8 8.4 8.8  Total Protein 6.4 - 8.3 g/dL 6.1(L) 5.4(L) 6.0(L)  Total Bilirubin 0.20 - 1.20 mg/dL 0.27 0.36 0.29  Alkaline Phos 40 - 150 U/L 110 100 107  AST 5 - 34 U/L 19 23 24   ALT 0 - 55 U/L 27 27 28    Results for REAKWON, BARREN (MRN [id number]) as of 12/26/2016  18:15  Ref. Range 11/03/2016 11:38 12/01/2016 14:19 12/22/2016 11:49  CA 19-9 Latest Ref Range: 0 - 35 U/mL 84 (H) 261 (H) 930 (H)     PATHOLOGY REPORT  Diagnosis 08/21/16 1. Liver, biopsy -  BENIGN VASCULAR PROLIFERATION. - NO MALIGNANCY IDENTIFIED. 2. Whipple procedure/resection - AMPULLARY INVASIVE ADENOCARCINOMA, MODERATELY DIFFERENTIATED, SPANNING 2.8 CM. - TUMOR INVADES PERIPANCREATIC TISSUE AND PANCREAS >0.5 CM. - RESECTION MARGINS ARE NEGATIVE. - METASTATIC CARCINOMA IN ONE OF SIXTEEN LYMPH NODES (1/16). - CHRONIC GASTRITIS. - CHRONIC CHOLECYSTITIS. - SEE ONCOLOGY TABLE. 3. Lymph node, biopsy, portal - ONE OF ONE LYMPH NODE NEGATIVE FOR CARCINOMA (0/1). - LIPOGRANULOMATOUS CHANGE. 4. Lymph node, biopsy, common hepatic - ONE OF ONE LYMPH NODE NEGATIVE FOR CARCINOMA (0/1). - LIPOGRANULOMATOUS CHANGE. 5. Lymph node, biopsy, Peripancreatic - ONE OF ONE LYMPH NODE NEGATIVE FOR CARCINOMA (0/1).  Diagnosis 03/20/2016 FINE NEEDLE ASPIRATION, ENDOSCOPIC, PANCREAS HEAD (SPECIMEN 1 OF 1 COLLECTED 03/20/16): MALIGNANT CELLS CONSISTENT WITH ADENOCARCINOMA. Preliminary Diagnosis Intraoperative Diagnosis: Adequate. ([facility])  Diagnosis 03/13/2016 Duodenum, Biopsy, Duodenal mass - BENIGN ULCERATED AND INFLAMED SMALL BOWEL-TYPE MUCOSA. - THERE IS NO EVIDENCE OF MALIGNANCY. - SEE COMMENT.  RADIOGRAPHIC STUDIES: I have personally reviewed the radiological images as listed and agreed with the findings in the report.  CT CAP W Contrast 12/25/16 IMPRESSION: 1. Today's study demonstrates evidence of new widespread metastatic disease to the liver, as discussed above. 2. No definite metastatic disease noted elsewhere in the chest or pelvis. 3. Status post Whipple procedure. 4. Aortic atherosclerosis, in addition to 2 vessel coronary artery disease. Please note that although the presence of coronary artery calcium documents the presence of coronary artery disease, the severity of this disease and any potential stenosis cannot be assessed on this non-gated CT examination. Assessment for potential risk factor modification, dietary therapy or pharmacologic therapy may be warranted, if  clinically indicated. 5. Additional incidental findings, as above.   CT Abdomen Pelvis w/ Contrast 08/31/2016 IMPRESSION: 1. Status post classic appearing Whipple procedure by dehiscence of suture sites. Marked gastric distention with fluid and food may reflect delayed gastric emptying. Fluid-filled jejunum has the appearance of postoperative ileus. No definite transition site is identified. 2. No sterile fluid collection or abscess. There are mottled gas like lucencies in the gallbladder fossa which may represent postop change.  EUS Dr. Benson [place]  Endosonographic Finding 03/20/2016 Findings: An irregular mass was identified in the pancreatic head. The mass was hypoechoic. The mass measured 39 mm by 32 mm in maximal cross-sectional diameter. The outer margins were irregular. An intact interface was seen between the mass and the celiac trunk and portal vein suggesting a lack of invasion. Fine needle aspiration for cytology was performed. Color Doppler imaging was utilized prior to needle puncture to confirm a lack of significant vascular structures within the needle path. Five passes were made with the 25 gauge needle using a transduodenal approach. A stylet was used. A cytotechnologist was present to evaluate the adequacy of the specimen. The cellularity of the specimen was adequate. Final cytology results are pending. - A mass was identified in the pancreatic head. Tissue was obtained from this exam, and results are pending. However, the endosonographic appearance is highly suspicious for adenocarcinoma. Fine needle aspiration performed.  ERCP 03/13/2016 Impression: - One covered metal stent was placed into the common bile duct.  CT chest, abdomen, pelvis w contrast 06/05/2016 IMPRESSION: Mildly decreased size of mass within pancreatic head and uncinate process, with common bile duct stent in appropriate position.  No evidence  of metastatic disease or other acute findings within  the chest, abdomen, or pelvis.  ASSESSMENT & PLAN:  61 y.o. African-American male, with past medical history of diabetes and hypertension, presented with epigastric pain, weight loss, and jaundice.  1. 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 -The patient underwent surgery with Dr. Barry Dienes on 08/21/2016 with Whipple procedure/resection of the pancreas, liver biopsy, and lymph node biopsy. Liver biopsy was benign. Whipple procedure revealed moderately differentiated ampullary invasive adenocarcinoma measuring 2.8 cm. The tumor invaded the peripancreatic tissue and the pancreas > 0.5 cm. The resection margins were negative. 1/19 biopsied lymph nodes were positive. -We previously discussed the patient's pathology and high risk of recurrence after surgery disease locally advanced disease even after neoadjuvant therapy.  -He has started adjuvant gemcitabine, tolerating moderately well -She has developed significant abdominal pain, fatigue, anorexia, concerning for cancer recurrence --Tumor marker is elevating as CA 19-9 is 261 (4/23), concerning for cancer recurrence. -I discussed his restaging CT scan from 12/25/2016, which unfortunately reviewed numerous liver metastasis. Giving the clinical presentation, this is almost certain metastasis from his previous pancreatic cancer. I do not feel liver biopsy is very necessary. --We discussed his cancer is not curable at this stage, but treatable. I strongly encouraged him to try palliative chemotherapy, I recommend FOLFOX or CAPOX, due to the logistics, patient opted CAPOX. -Patient was quite overwhelmed during the visit today due to the new metastasis. He did not want to schedule his chemotherapy treatment at this point. He wants to think about in the next few days. -I offered Research officer, political party and chaplain services, he declined -His wife called me after his visit, we discussed his scan findings, she is very concerned about him how he will handle this bad news, and will leave work to go him right away  2. HTN and DM - he will continue medication and follow up with his primary care physician  -We again reviewed that his blood pressure and blood glucose will need to be monitored closely during the chemotherapy, and his medication may need to be adjusted  -His sugar has been increasing due to the steroids. Still, fasting values not above 150-200. I previously encouraged him to check more frequently, and cover with short acting insulin. -Today (5/7) his Blood sugar was 140 before eating and 262 at clinic after eating.  3. Malnutrition and weight loss -He has previously lost 6 lbs in a week -he was previously seen by our dietitian Pamala Hurry and will follow up. -We'll watch his weight and nutrition status closely when he is on chemotherapy. -He is on TPN and has a PEG tube. The patient states he is not using his PEG tube and is not taking Glucerna due to fear of spiking his sugar. -He only eats 2 small meals a day with 2 cans of Ensure. I previously encouraged him to eat more, or increase his Ensure intake. -He will talk to our nutritionist again.  -he is off TPN now -I previously encouraged him to eat 4-5 small meals a day.   4. Depression -The patient is depressed about his diagnosis. -We again discussed the patient speaking with social work and the chaplain for which he has their numbers. -He previously tried mirtazapine, but did not like it and has stopped .  5. Goal of care discussion  -We discussed the incurable nature of his metastatic cancer, and  the overall poor prognosis, especially if he does not have good response to chemotherapy or progress on chemo -The patient understands the goal of care is palliative. -I recommend DNR/DNI, he will think about it    Plan -We  reviewed his restaging CT scan, unfortunately he has developed numerous liver metastasis  -We'll stop gemcitabine today. I recommend second line chemotherapy  CAPOX -he will call in a few days to f/u and schedule his chemo if he agrees  -I spoke with pt's wife today    All questions were answered. The patient knows to call the clinic with any problems, questions or concerns.  I spent 30 minutes counseling the patient face to face. The total time spent in the appointment was 40 minutes and more than 50% was on counseling.  This document serves as a record of services personally performed by Truitt Merle, MD. It was created on her behalf by Joslyn Devon, a trained medical scribe. The creation of this record is based on the scribe's personal observations and the provider's statements to them. This document has been checked and approved by the attending provider.   I have reviewed the above documentation for accuracy and completeness and I agree with the above.   Truitt Merle, MD 12/29/2016

## 2016-12-29 ENCOUNTER — Ambulatory Visit (HOSPITAL_BASED_OUTPATIENT_CLINIC_OR_DEPARTMENT_OTHER): Payer: BLUE CROSS/BLUE SHIELD | Admitting: Hematology

## 2016-12-29 VITALS — BP 153/77 | HR 64 | Temp 98.8°F | Resp 20 | Ht 71.0 in | Wt 162.1 lb

## 2016-12-29 DIAGNOSIS — F329 Major depressive disorder, single episode, unspecified: Secondary | ICD-10-CM

## 2016-12-29 DIAGNOSIS — R634 Abnormal weight loss: Secondary | ICD-10-CM | POA: Diagnosis not present

## 2016-12-29 DIAGNOSIS — C250 Malignant neoplasm of head of pancreas: Secondary | ICD-10-CM | POA: Diagnosis not present

## 2016-12-29 DIAGNOSIS — E46 Unspecified protein-calorie malnutrition: Secondary | ICD-10-CM

## 2016-12-29 DIAGNOSIS — C787 Secondary malignant neoplasm of liver and intrahepatic bile duct: Secondary | ICD-10-CM | POA: Diagnosis not present

## 2016-12-29 DIAGNOSIS — Z7189 Other specified counseling: Secondary | ICD-10-CM

## 2016-12-29 DIAGNOSIS — E118 Type 2 diabetes mellitus with unspecified complications: Secondary | ICD-10-CM

## 2016-12-29 DIAGNOSIS — C259 Malignant neoplasm of pancreas, unspecified: Secondary | ICD-10-CM

## 2016-12-30 ENCOUNTER — Encounter: Payer: Self-pay | Admitting: Hematology

## 2016-12-30 DIAGNOSIS — Z7189 Other specified counseling: Secondary | ICD-10-CM | POA: Insufficient documentation

## 2016-12-30 NOTE — Progress Notes (Signed)
DISCONTINUE ON PATHWAY REGIMEN - Pancreatic     A cycle is every 28 days:     Gemcitabine        Dose Mod: None     Capecitabine        Dose Mod: None  **Always confirm dose/schedule in your pharmacy ordering system**    REASON: Disease Progression PRIOR TREATMENT: PANOS75: Gemcitabine 1,000 mg/m2 D1, 8, 15 + Capecitabine 830 mg/m2 BID D1-21 q28 Days x 6 Cycles TREATMENT RESPONSE: N/A - Adjuvant Therapy  Pancreatic - No Medical Intervention - Off Treatment.  Patient Characteristics: Adenocarcinoma, Metastatic Disease, Second Line, MSS/pMMR or MSI Unknown, If Gemcitabine/Nab-Paclitaxel (Abraxane(R)) or If Gemcitabine First Line Histology: Adenocarcinoma Current evidence of distant metastases? Yes AJCC T Category: [place] AJCC N Category: NX AJCC M Category: M1 AJCC 8 Stage Grouping: IV Line of therapy: Second Line Would you be surprised if this patient died  in the next year? I would NOT be surprised if this patient died in the next year Microsatellite/Mismatch Repair Status: Unknown

## 2017-01-01 ENCOUNTER — Telehealth: Payer: Self-pay | Admitting: Hematology

## 2017-01-01 NOTE — Telephone Encounter (Signed)
No los per 12/29/16 visit.

## 2017-01-06 ENCOUNTER — Telehealth: Payer: Self-pay | Admitting: Hematology

## 2017-01-06 NOTE — Telephone Encounter (Signed)
I called pt to follow up on his decision about treatment. Pt states he is doing OK, and wants to take more time to think about it and will call me back.   Truitt Merle  01/06/2017

## 2017-01-08 ENCOUNTER — Telehealth: Payer: Self-pay | Admitting: Hematology

## 2017-01-08 NOTE — Telephone Encounter (Signed)
FAXED RECORDS TO [facility] RELEASE ID [id number]

## 2017-01-15 ENCOUNTER — Telehealth: Payer: Self-pay | Admitting: *Deleted

## 2017-01-15 NOTE — Telephone Encounter (Signed)
Received call from pt informing that he plans to go to [facility] for a second opinion & hopefully next week.  Message to Dr Burr Medico.

## 2017-01-27 IMAGING — CT CT CHEST W/ CM
2 of 9 series · 15 of 46 positions shown, 17 images · IV contrast (iopamidol)
Comparison: Chest CT on 04/03/2016 and AP CT on 03/12/2016

CLINICAL DATA: Pancreatic adenocarcinoma. Undergoing chemotherapy.
Restaging.

EXAM:
CT CHEST, ABDOMEN, AND PELVIS WITH CONTRAST
TECHNIQUE: Multidetector CT imaging of the chest, abdomen and pelvis was
performed following the standard protocol during bolus
administration of intravenous contrast.
CONTRAST:  100mL [id number] IOPAMIDOL ([id number]) INJECTION 61%

[Series 3: coronal arterial · coronal · arterial · 0.53mm/px · 3 of 80 slices shown]
[im 20/80  soft-tissue]
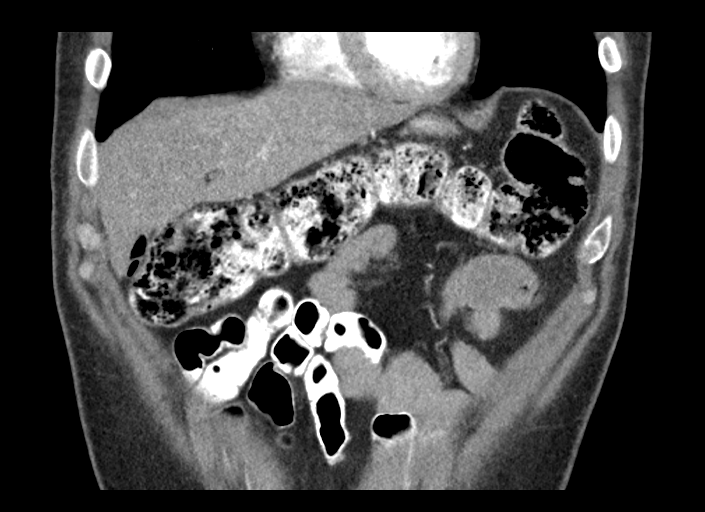
[im 40/80  soft-tissue]
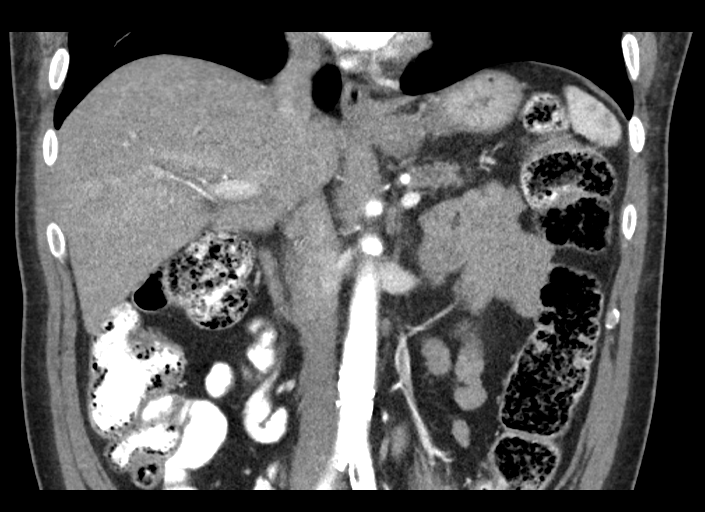
[im 60/80  soft-tissue]
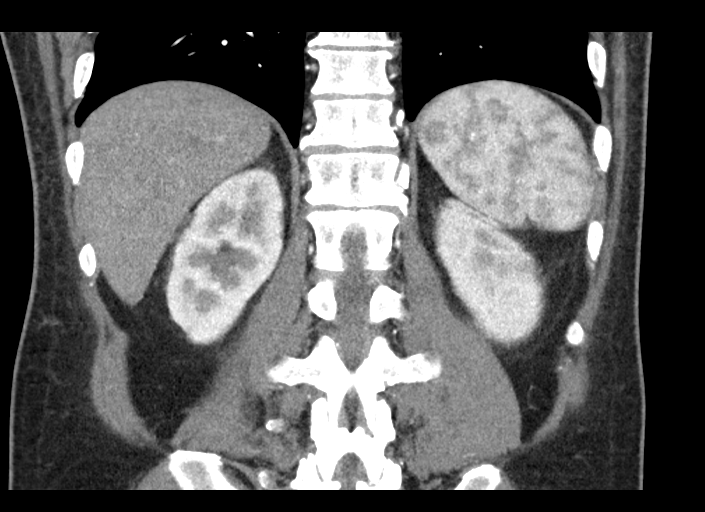

[Series 10: portal thin · axial · portal-venous · 0.70mm/px · z∈[-610,-60]mm · 12 of 321 slices shown, 14 images]
[im 23/321  soft-tissue]
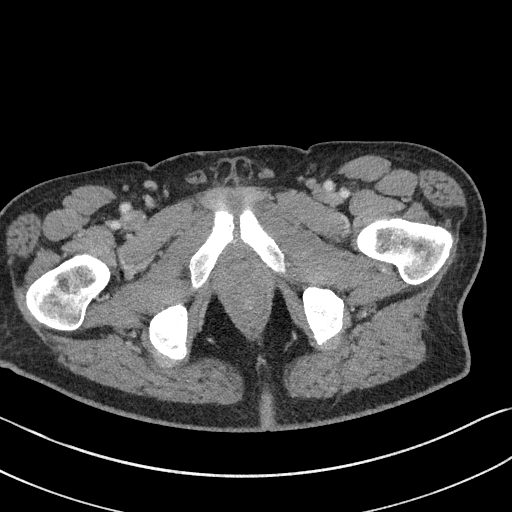
[im 23/321  bone]
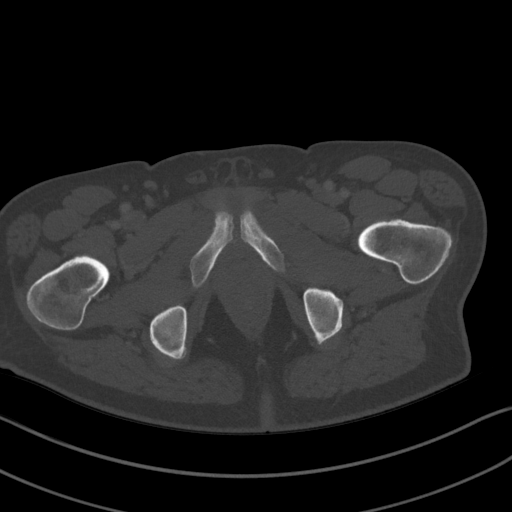
[im 46/321  soft-tissue]
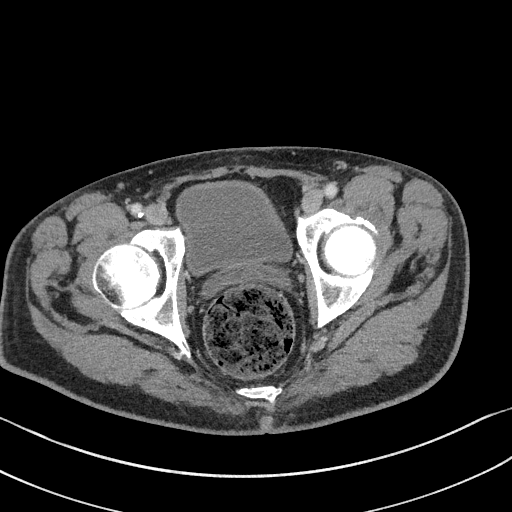
[im 69/321  soft-tissue]
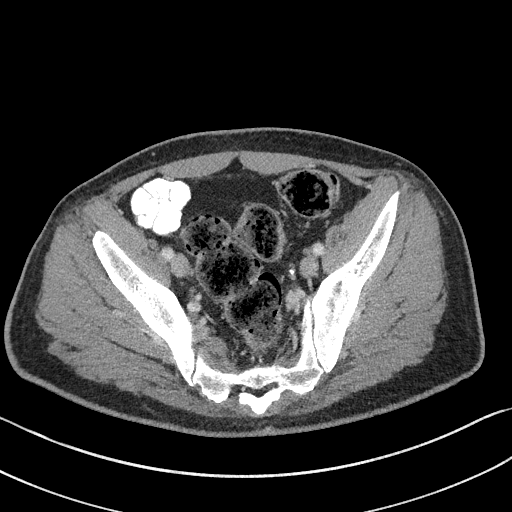
[im 92/321  soft-tissue]
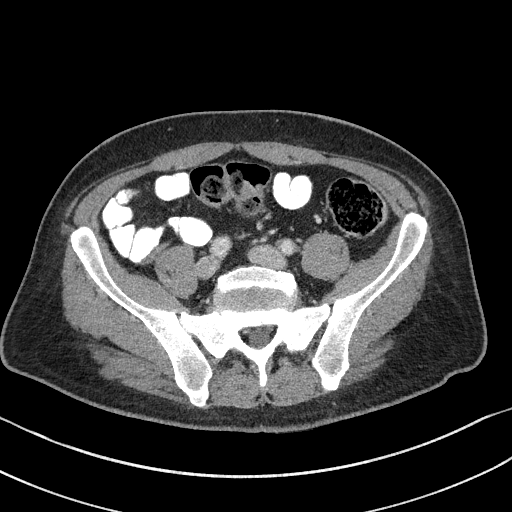
[im 115/321  soft-tissue]
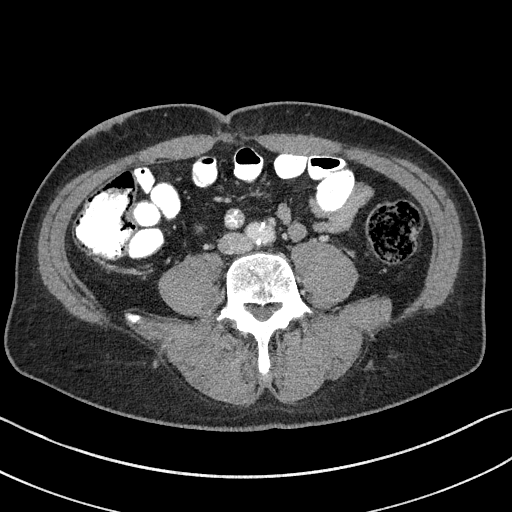
[im 138/321  soft-tissue]
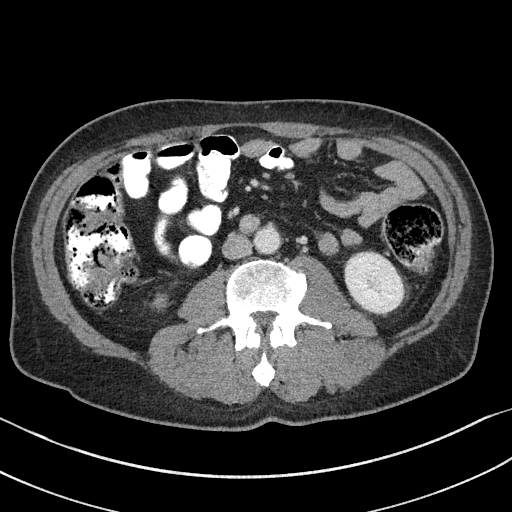
[im 183/321  soft-tissue]
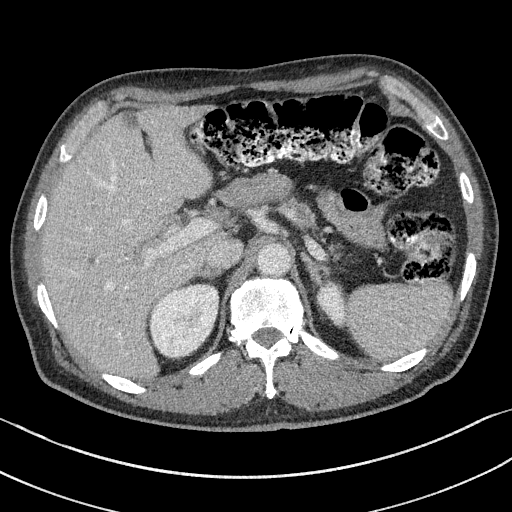
[im 206/321  soft-tissue]
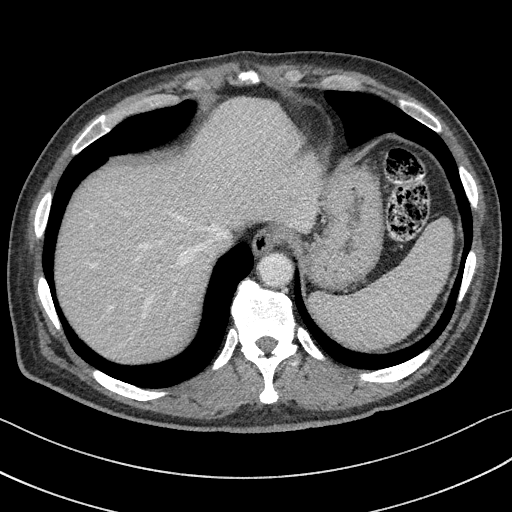
[im 229/321  soft-tissue]
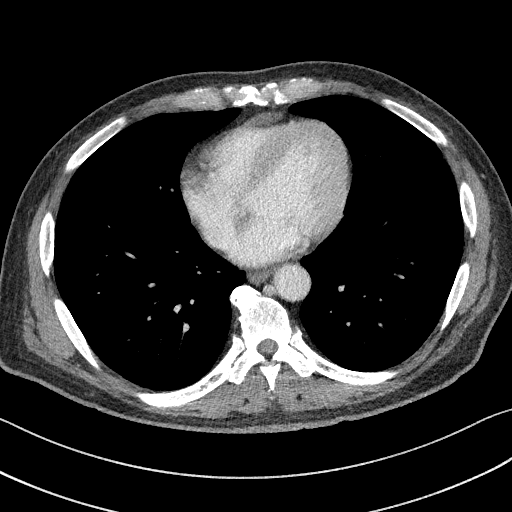
[im 229/321  bone]
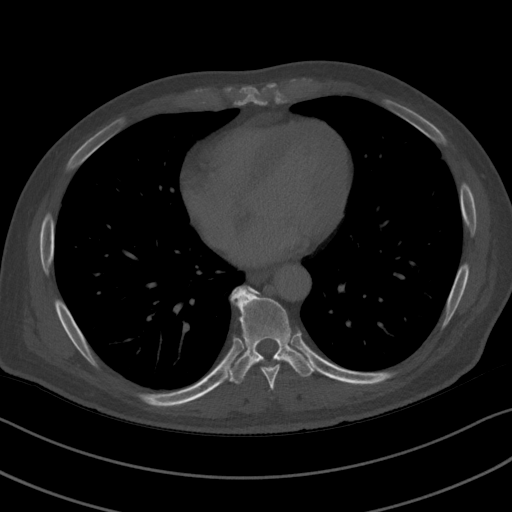
[im 252/321  soft-tissue]
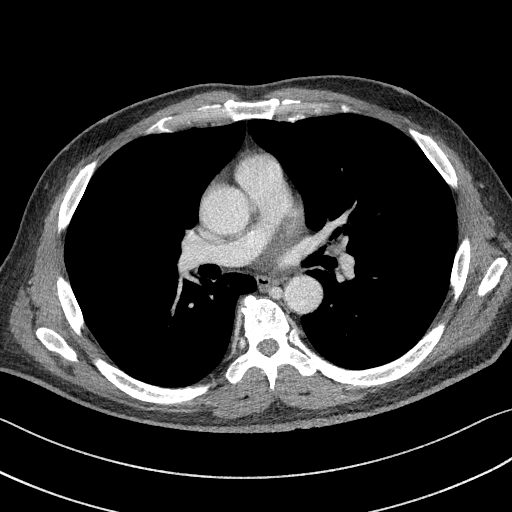
[im 275/321  soft-tissue]
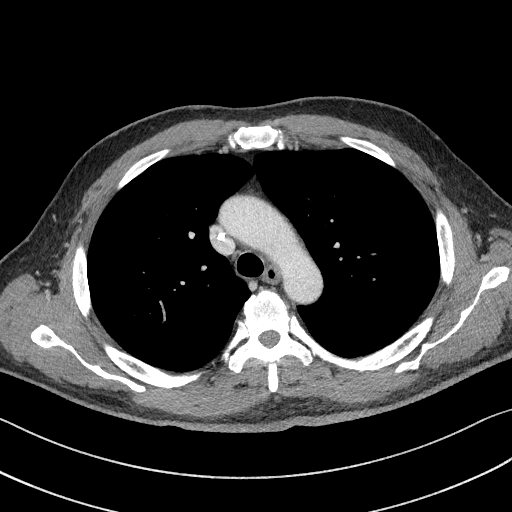
[im 298/321  soft-tissue]
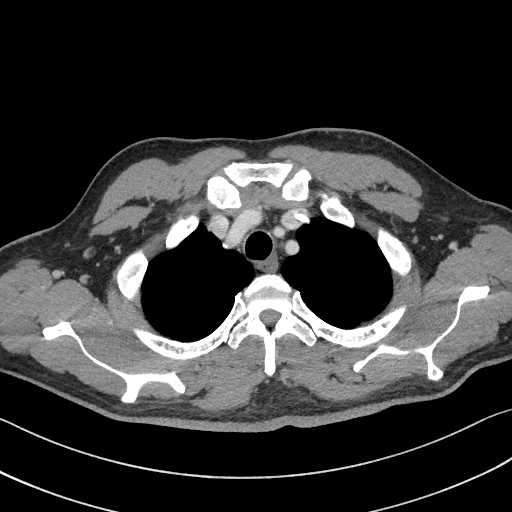

[15 of 46 positions shown; findings below may reference images not displayed]

FINDINGS: CT CHEST FINDINGS

Cardiovascular: No acute findings. Left subclavian Port-A-Cath in
appropriate position. Coronary artery calcification. Aortic
atherosclerosis.

Mediastinum/Lymph Nodes: No masses or pathologically enlarged lymph
nodes identified.

Lungs/Pleura: No pulmonary infiltrate or mass identified. No
effusion present.

Musculoskeletal: Small sclerotic bone lesions in the left posterior
second and third ribs are stable and likely represent benign bone
islands. No other bone lesions identified.

CT ABDOMEN AND PELVIS FINDINGS

Hepatobiliary: No definite hepatic masses are identified. Probable
tiny sub-cm cyst seen in the right hepatic lobe, and focal fatty
infiltration adjacent falciform ligament. Mild periportal edema and
pneumobilia now seen with stent in the distal common bile duct.
Gallbladder is unremarkable.

Pancreas: Stent now seen within the distal common bile duct.
Previously seen low-attenuation mass in the pancreatic head and
uncinate process is less conspicuous than on previous study. This
currently measures approximately 2.0 x 3.0 cm in maximum dimensions
on image 47/2, compared with 3.2 x 3.5 cm when measured at same
level on previous study. This mass abuts the posterior margin of the
superior mesenteric vein on image 164/10 but does not encase the
SMV. This mass shows no other vascular involvement. Increased
atrophy of the remainder of the pancreas is demonstrated.

Spleen:  Within normal limits in size and appearance.

Adrenals/Urinary tract: No masses or hydronephrosis. Tiny sub-cm
renal cysts again noted.

Stomach/Bowel: No evidence of obstruction, inflammatory process, or
abnormal fluid collections.

Vascular/Lymphatic: No pathologically enlarged lymph nodes
identified. No abdominal aortic aneurysm. Aortic atherosclerosis.

Reproductive:  No mass or other significant abnormality identified.

Other:  None.

Musculoskeletal:  No suspicious bone lesions identified.
IMPRESSION: Mildly decreased size of mass within pancreatic head and uncinate
process, with common bile duct stent in appropriate position.

No evidence of metastatic disease or other acute findings within the
chest, abdomen, or pelvis.

## 2017-02-03 ENCOUNTER — Telehealth: Payer: Self-pay | Admitting: *Deleted

## 2017-02-03 NOTE — Telephone Encounter (Signed)
Wife Craig [name] called requesting a call back from nurse.  Spoke with Craig [name], and was informed that pt went to [facility] in [place]  about 2 weeks ago.  Pt received 2 different types of chemo at that center.   Pt is weak, just wanted to lie around the house.  Eating and drinking ok; however, pt has abdominal pain every time he eats.   Per Craig [name], pt did not want to go back to [place] again due to chemo treatment made pt feeling sicker.   Craig [name] stated she could not bring pt back to [facility] anyway.   Craig [name] and pt  would like to have an appt with Dr. Burr Medico to discuss what the next plan of treatment will be, what to expect and for how long. Asked Craig [name] to have pt's records related to his chemo treatment in [place] faxed to our office for Dr. Burr Medico to review.   Informed Craig [name] that Dr. Burr Medico will not be back in office until July 2.   Craig [name] understood that nurse will contact pt and her with further instructions from md. Craig [name]'s    Phone     [phone].

## 2017-02-09 ENCOUNTER — Other Ambulatory Visit: Payer: BLUE CROSS/BLUE SHIELD

## 2017-02-09 ENCOUNTER — Other Ambulatory Visit: Payer: Self-pay | Admitting: Hematology

## 2017-02-09 ENCOUNTER — Ambulatory Visit: Payer: BLUE CROSS/BLUE SHIELD | Admitting: Hematology

## 2017-02-09 ENCOUNTER — Telehealth: Payer: Self-pay | Admitting: Hematology

## 2017-02-09 NOTE — Telephone Encounter (Signed)
lvm to inform pt of appts 7/3 at 4 pm per staff msg

## 2017-02-09 NOTE — Telephone Encounter (Signed)
sw pt to inform of today's appt per sch msg. Pt stated he did not want to come in today. Informed pt we will be in touch with next appt from [facility]

## 2017-02-09 NOTE — Telephone Encounter (Signed)
I will offer him a f/u appointment today.   Truitt Merle MD

## 2017-02-10 ENCOUNTER — Ambulatory Visit (HOSPITAL_BASED_OUTPATIENT_CLINIC_OR_DEPARTMENT_OTHER): Payer: BLUE CROSS/BLUE SHIELD | Admitting: Hematology

## 2017-02-10 ENCOUNTER — Other Ambulatory Visit (HOSPITAL_BASED_OUTPATIENT_CLINIC_OR_DEPARTMENT_OTHER): Payer: BLUE CROSS/BLUE SHIELD

## 2017-02-10 VITALS — BP 135/74 | HR 79 | Temp 97.5°F | Resp 20 | Ht 71.0 in | Wt 145.9 lb

## 2017-02-10 DIAGNOSIS — R634 Abnormal weight loss: Secondary | ICD-10-CM

## 2017-02-10 DIAGNOSIS — E46 Unspecified protein-calorie malnutrition: Secondary | ICD-10-CM | POA: Diagnosis not present

## 2017-02-10 DIAGNOSIS — Z7189 Other specified counseling: Secondary | ICD-10-CM

## 2017-02-10 DIAGNOSIS — F329 Major depressive disorder, single episode, unspecified: Secondary | ICD-10-CM | POA: Diagnosis not present

## 2017-02-10 DIAGNOSIS — C787 Secondary malignant neoplasm of liver and intrahepatic bile duct: Secondary | ICD-10-CM

## 2017-02-10 DIAGNOSIS — C250 Malignant neoplasm of head of pancreas: Secondary | ICD-10-CM | POA: Diagnosis not present

## 2017-02-10 DIAGNOSIS — C259 Malignant neoplasm of pancreas, unspecified: Secondary | ICD-10-CM

## 2017-02-10 DIAGNOSIS — R17 Unspecified jaundice: Secondary | ICD-10-CM

## 2017-02-10 DIAGNOSIS — R109 Unspecified abdominal pain: Secondary | ICD-10-CM

## 2017-02-10 DIAGNOSIS — G893 Neoplasm related pain (acute) (chronic): Secondary | ICD-10-CM

## 2017-02-10 LAB — CBC WITH DIFFERENTIAL/PLATELET
BASO%: 0.4 % (ref 0.0–2.0)
BASOS ABS: 0 10*3/uL (ref 0.0–0.1)
EOS ABS: 0 10*3/uL (ref 0.0–0.5)
EOS%: 0.8 % (ref 0.0–7.0)
HEMATOCRIT: 30.7 % — AB (ref 38.4–49.9)
HEMOGLOBIN: 10.3 g/dL — AB (ref 13.0–17.1)
LYMPH#: 0.8 10*3/uL — AB (ref 0.9–3.3)
LYMPH%: 17 % (ref 14.0–49.0)
MCH: 26.6 pg — ABNORMAL LOW (ref 27.2–33.4)
MCHC: 33.6 g/dL (ref 32.0–36.0)
MCV: 79.3 fL (ref 79.3–98.0)
MONO#: 0.7 10*3/uL (ref 0.1–0.9)
MONO%: 14 % (ref 0.0–14.0)
NEUT#: 3.3 10*3/uL (ref 1.5–6.5)
NEUT%: 67.8 % (ref 39.0–75.0)
NRBC: 0 % (ref 0–0)
PLATELETS: 311 10*3/uL (ref 140–400)
RBC: 3.87 10*6/uL — ABNORMAL LOW (ref 4.20–5.82)
RDW: 20.3 % — AB (ref 11.0–14.6)
WBC: 4.9 10*3/uL (ref 4.0–10.3)

## 2017-02-10 LAB — COMPREHENSIVE METABOLIC PANEL
ALBUMIN: 2.4 g/dL — AB (ref 3.5–5.0)
ALK PHOS: 557 U/L — AB (ref 40–150)
ALT: 62 U/L — ABNORMAL HIGH (ref 0–55)
ANION GAP: 9 meq/L (ref 3–11)
AST: 61 U/L — ABNORMAL HIGH (ref 5–34)
BILIRUBIN TOTAL: 17.46 mg/dL — AB (ref 0.20–1.20)
BUN: 6 mg/dL — ABNORMAL LOW (ref 7.0–26.0)
CALCIUM: 9.6 mg/dL (ref 8.4–10.4)
CO2: 29 mEq/L (ref 22–29)
Chloride: 99 mEq/L (ref 98–109)
Creatinine: 0.8 mg/dL (ref 0.7–1.3)
GLUCOSE: 118 mg/dL (ref 70–140)
Potassium: 3.9 mEq/L (ref 3.5–5.1)
Sodium: 137 mEq/L (ref 136–145)
TOTAL PROTEIN: 6.3 g/dL — AB (ref 6.4–8.3)

## 2017-02-10 MED ORDER — MORPHINE SULFATE ER 30 MG PO TBCR: 30.0000 mg | EXTENDED_RELEASE_TABLET | Freq: Two times a day (BID) | ORAL | 0 refills | Status: DC

## 2017-02-10 NOTE — Progress Notes (Signed)
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 Question loss of fat plane between the posterior aspect of mass and IVC. Mildly dilated CBD 10 mm.      03/13/2016 Procedure    ERCP and medical stem placement in CBD      03/20/2016 Initial Diagnosis    Primary pancreatic adenocarcinoma (HCC)      03/20/2016 Initial Biopsy    FNA of the pancreatic head mass from EUS showed a malignant cells consistent with adenocarcinoma      03/20/2016 Procedure    EUS showed an irregular mass in the pancreatic head, measuring 3.9 x 3.2 cm, no invading of the celiac trunk or portal vein. The mass was biopsied.       04/09/2016 -  Neo-Adjuvant Chemotherapy    Gemcitabine and Abraxane on Day 1, 8 every 21 days       06/05/2016 Imaging    CT CAP w Contrast 06/05/16 IMPRESSION: Mildly decreased size of mass within pancreatic head and uncinateprocess, with common bile duct stent in appropriate position. No evidence of metastatic disease or other acute findings within thechest, abdomen, or  pelvis.      08/21/2016 Surgery    Whipple surgery by Dr. Donell Beers       08/21/2016 Pathology Results    Whipple surgery showed ampullary invasive adenocarcinoma, moderately differentiated, 2.8 cm, tumor invades peripancreatic tissue and pancreas more than 0.5 cm, resection margins are negative, metastatic carcinoma in 1 of 16 lymph nodes. Biopsy of the liver and portal lymph nodes are negative.       08/31/2016 Imaging    CT CAP w Contrast 08/31/16 IMPRESSION: 1. Status post classic appearing Whipple procedure by dehiscence of suture sites. Marked gastric distention with fluid and food may reflect delayed gastric emptying. Fluid-filled jejunum has the appearance of postoperative ileus. No definite transition site is identified. 2. No sterile fluid collection or abscess. There are mottled gas like lucencies in the gallbladder fossa which may represent postop change.      11/03/2016 - 12/22/2016 Adjuvant Chemotherapy    Gemcitabine      12/25/2016 Imaging    CT CAP W Contrast 12/25/16 IMPRESSION: 1. Today's study demonstrates evidence of new widespread metastatic disease to the liver, as discussed above. 2. No definite metastatic disease noted elsewhere in the chest or pelvis. 3. Status post Whipple procedure. 4. Aortic atherosclerosis, in addition to 2 vessel coronary artery disease. Please note that although the presence of coronary artery calcium documents the presence of coronary artery disease, the severity of this disease and any potential stenosis cannot  be assessed on this non-gated CT examination. Assessment for potential risk factor modification, dietary therapy or pharmacologic therapy may be warranted, if clinically indicated. 5. Additional incidental findings, as above.         Adenocarcinoma of head of pancreas ([place])   08/21/2016 Initial Diagnosis    Adenocarcinoma of head of pancreas ([place])     HISTORY OF PRESENTING ILLNESS:  Craig [name] [name] 61 y.o. male is here  because of His newly diagnosed pancreatic cancer. He is coming up by his wife to our multidisciplinary check clinic today.  He has had epigastric pain for the last 4-6 weeks. He describes the pain is intermittent, last a few hours and a few times a day, located in the low mid chest and upper abdomen, not related to eating or position. He denies nausea, cough, or dyspnea. His appetite has decreased lately, and he complains about moderate fatigue, he was not able to tolerate his physically demanding job, and he has been out of week for 3 weeks. He lost about 30-40 lbs in the past 2 months.   He was seen by his primary care physician a few times, and he developed jaundice, and abnormal liver function. He was sent to Hospital by his primary care physician. During his hospital stay, CT scan reviewed a pancreatic head mass, and mild dilatation of bile duct. He was seen by GI Dr. Jacelyn Grip, underwent ERCP and metal stent placement in CBD. He subsequently underwent EUS which showed an irregular mass in the pancreatic head, measuring 3.9 cm, no invading of the celiac trunk or portal vein. The mass was biopsied, which showed adenocarcinoma.  He is married, lives with his wife. His abdominal pain, fatigue has not changed much since the CBD stent placement. He is able to tolerate routine activities, no other new complaints. He has been drinking beer moderately for the past 40 years, but stopped a few weeks ago.   CURRENT THERAPY: supportive care    INTERIM HISTORY:  Craig [name] [name] returns for follow-up. He went to the cancer centers of [place] in [place] last month for second opinion, and received 1 dose of chemotherapy FOLFOX. He was found to have abnormal liver functions that time. He tolerated the chemotherapy poorly, and decided not to go back there. Patient and his wife called me last week, he returns for follow-up. He says he is very fatigued throughout the day and takes naps a lot. He has pain on his left side where  he takes oxycodone about 3 times a day which helps. The pain keeps him up at night. He denies any constipation. His urine has been dark lately.    MEDICAL HISTORY:  Past Medical History:  Diagnosis Date  . Diabetes mellitus without complication ([place])   . ETOH abuse   . Hypertension    off bp meds 3 4- months ago  . Primary pancreatic adenocarcinoma ([place]) 03/28/2016  . Type 2 diabetes mellitus ([place])     SURGICAL HISTORY: Past Surgical History:  Procedure Laterality Date  . ERCP Left 03/13/2016   Procedure: ENDOSCOPIC RETROGRADE CHOLANGIOPANCREATOGRAPHY (ERCP);  Surgeon: Carol Ada, MD;  Location: [facility] ENDOSCOPY;  Service: Endoscopy;  Laterality: Left;  . EUS N/A 03/20/2016   Procedure: UPPER ENDOSCOPIC ULTRASOUND (EUS) LINEAR;  Surgeon: Carol Ada, MD;  Location: [facility] ENDOSCOPY;  Service: Endoscopy;  Laterality: N/A;  . HERNIA REPAIR    . KNEE ARTHROSCOPY     left and right  . LAPAROSCOPY N/A 08/21/2016   Procedure: LAPAROSCOPY DIAGNOSTIC;  Surgeon: Stark Klein, MD;  Location: [facility] ORS;  Service: General;  Laterality: N/A;  . PORTACATH PLACEMENT N/A 04/07/2016   Procedure: INSERTION PORT-A-CATH;  Surgeon: Stark Klein, MD;  Location: [facility];  Service: General;  Laterality: N/A;  . WHIPPLE PROCEDURE N/A 08/21/2016   Procedure: WHIPPLE PROCEDURE;  Surgeon: Stark Klein, MD;  Location: [facility] ORS;  Service: General;  Laterality: N/A;    SOCIAL HISTORY: Social History   Social History  . Marital status: Married    Spouse name: N/A  . Number of children: N/A  . Years of education: N/A   Occupational History  . Not on file.   Social History Main Topics  . Smoking status: Former Smoker    Packs/day: 1.00    Years: 15.00    Quit date: 08/12/1995  . Smokeless tobacco: Never Used  . Alcohol use Yes     Comment: he used to drink 6 pack beer a day for 40 years, quit in 02/2016   . Drug use: No  . Sexual activity: Not on file   Other Topics Concern  . Not on file   Social History  Narrative  . No narrative on file    FAMILY HISTORY: Family History  Problem Relation Age of Onset  . Heart attack Mother     ALLERGIES:  is allergic to no known allergies.  MEDICATIONS:  Current Outpatient Prescriptions  Medication Sig Dispense Refill  . betamethasone dipropionate (DIPROLENE) 0.05 % cream Apply topically 2 (two) times daily. 30 g 0  . bisacodyl (DULCOLAX) 10 MG suppository Place 1 suppository (10 mg total) rectally daily as needed for moderate constipation. 12 suppository 0  . capecitabine (XELODA) 500 MG tablet Take 3 tablets (1,500 mg total) by mouth 2 (two) times daily after a meal. For 14 days on, 7 days off (Patient not taking: Reported on 11/17/2016) 84 tablet 2  . folic acid (FOLVITE) 1 MG tablet TAKE ONE TABLET BY MOUTH DAILY 30 tablet 0  . Insulin Glargine (LANTUS SOLOSTAR) 100 UNIT/ML Solostar Pen Inject 16 Units into the skin every morning. 15 mL 11  . lidocaine-prilocaine (EMLA) cream Apply to affected area once 30 g 3  . LORazepam (ATIVAN) 1 MG tablet Take 0.5-1 tablets (0.5-1 mg total) by mouth 3 (three) times daily as needed for anxiety. 15 tablet 0  . methocarbamol (ROBAXIN) 500 MG tablet Take 1 tablet (500 mg total) by mouth every 8 (eight) hours as needed for muscle spasms. 90 tablet 2  . metoCLOPramide (REGLAN) 10 MG tablet Take 1 tablet (10 mg total) by mouth 4 (four) times daily. 120 tablet 3  . metoprolol tartrate (LOPRESSOR) 25 MG tablet Take 0.5 tablets (12.5 mg total) by mouth 2 (two) times daily. (Patient taking differently: Take 12.5 mg by mouth daily. ) 60 tablet 1  . mirtazapine (REMERON) 15 MG tablet Take 1 tablet (15 mg total) by mouth at bedtime. 30 tablet 1  . Nutritional Supplements (FEEDING SUPPLEMENT, VITAL AF 1.2 CAL,) LIQD Place 1,000 mLs into feeding tube continuous. 5000 mL 12  . ondansetron (ZOFRAN) 8 MG tablet Take 1 tablet (8 mg total) by mouth every 8 (eight) hours as needed (Nausea or vomiting). 30 tablet 1  . oxyCODONE (OXY  IR/ROXICODONE) 5 MG immediate release tablet Take 1-2 tablets (5-10 mg total) by mouth every 4 (four) hours as needed for moderate pain, severe pain or breakthrough pain. 60 tablet 0  . Pancrelipase, Lip-Prot-Amyl, (ZENPEP PO) Take 2 capsules by mouth 3 (three) times daily.    Marland Kitchen  pantoprazole (PROTONIX) 40 MG tablet Take 1 tablet (40 mg total) by mouth daily. 30 tablet 3  . potassium chloride SA (K-DUR,KLOR-CON) 20 MEQ tablet Take 1 tablet (20 mEq total) by mouth daily. 30 tablet 1  . prochlorperazine (COMPAZINE) 10 MG tablet Take 1 tablet (10 mg total) by mouth every 8 (eight) hours as needed (Nausea or vomiting). 30 tablet 1  . simethicone (MYLICON) 80 MG chewable tablet Chew 1 tablet (80 mg total) by mouth 3 (three) times daily. 30 tablet 0  . thiamine 100 MG tablet Take 1 tablet (100 mg total) by mouth daily. 30 tablet 0   No current facility-administered medications for this visit.    REVIEW OF SYSTEMS:  Constitutional: Denies fevers, chills or abnormal night sweats (+) loss of appetite (+)weight loss (+) extreme fatigue Eyes: Denies blurriness of vision, double vision or watery eyes Ears, nose, mouth, throat, and face: Denies mucositis or sore throat  Respiratory: Denies cough, dyspnea or wheezes Cardiovascular: Denies palpitation, chest discomfort or lower extremity swelling Gastrointestinal:  Denies nausea, heartburn Skin: Denies abnormal skin rashes  Lymphatics: Denies new lymphadenopathy or easy bruising Neurological:Denies numbness, tingling or new weaknesses Behavioral/Psych: Mood is stable, no new changes  MSK: (+)left side pain All other systems were reviewed with the patient and are negative.  PHYSICAL EXAMINATION: ECOG PERFORMANCE STATUS: 3 Vitals:   02/10/17 1625  BP: 135/74  Pulse: 79  Resp: 20  Temp: (!) 97.5 F (36.4 C)  TempSrc: Oral  SpO2: 100%  Weight: 145 lb 14.4 oz (66.2 kg)  Height: '5\' 11"'$  (1.803 m)     GENERAL:alert, Cachectic, chronic ill  appearing SKIN: (+) Jaundice EYES: normal, conjunctiva are pink and non-injected,  (+)jaundice in eyes OROPHARYNX:no exudate, no erythema and lips, buccal mucosa, and tongue normal  NECK: supple, thyroid normal size, non-tender, without nodularity LYMPH:  no palpable lymphadenopathy in the cervical, axillary or inguinal LUNGS: clear to auscultation and percussion with normal breathing effort HEART: regular rate & rhythm and no murmurs and no lower extremity edema BDOMEN: soft,  nondistended, moderate tenderness in the left upper and epigastric area, no rebound pain  Musculoskeletal:no cyanosis of digits and no clubbing  Extremities: no edema PSYCH: alert & oriented x 3 with fluent speech NEURO: no focal motor/sensory deficits  LABORATORY DATA:  I have reviewed the data as listed CBC Latest Ref Rng & Units 02/10/2017 12/22/2016 12/15/2016  WBC 4.0 - 10.3 10e3/uL 4.9 2.2(L) 4.3  Hemoglobin 13.0 - 17.1 g/dL 10.3(L) 9.7(L) 9.9(L)  Hematocrit 38.4 - 49.9 % 30.7(L) 29.2(L) 30.1(L)  Platelets 140 - 400 10e3/uL 311 188 303   CMP Latest Ref Rng & Units 02/10/2017 12/22/2016 12/15/2016  Glucose 70 - 140 mg/dl 118 48(L) 262(H)  BUN 7.0 - 26.0 mg/dL 6.0(L) 2.9(L) 3.1(L)  Creatinine 0.7 - 1.3 mg/dL 0.8 0.7 0.8  Sodium 136 - 145 mEq/L 137 144 139  Potassium 3.5 - 5.1 mEq/L 3.9 2.8(LL) 3.7  Chloride 101 - 111 mmol/L - - -  CO2 22 - 29 mEq/L '29 28 25  '$ Calcium 8.4 - 10.4 mg/dL 9.6 8.8 8.4  Total Protein 6.4 - 8.3 g/dL 6.3(L) 6.1(L) 5.4(L)  Total Bilirubin 0.20 - 1.20 mg/dL 17.46(HH) 0.27 0.36  Alkaline Phos 40 - 150 U/L 557(H) 110 100  AST 5 - 34 U/L 61(H) 19 23  ALT 0 - 55 U/L 62(H) 27 27   Results for TIMTOHY, BROSKI (MRN [id number]) as of 12/26/2016 18:15  Ref. Range 11/03/2016 11:38 12/01/2016 14:19 12/22/2016 11:49  CA 19-9 Latest Ref Range: 0 - 35 U/mL 84 (H) 261 (H) 930 (H)     PATHOLOGY REPORT  Diagnosis 08/21/16 1. Liver, biopsy - BENIGN VASCULAR PROLIFERATION. - NO MALIGNANCY  IDENTIFIED. 2. Whipple procedure/resection - AMPULLARY INVASIVE ADENOCARCINOMA, MODERATELY DIFFERENTIATED, SPANNING 2.8 CM. - TUMOR INVADES PERIPANCREATIC TISSUE AND PANCREAS >0.5 CM. - RESECTION MARGINS ARE NEGATIVE. - METASTATIC CARCINOMA IN ONE OF SIXTEEN LYMPH NODES (1/16). - CHRONIC GASTRITIS. - CHRONIC CHOLECYSTITIS. - SEE ONCOLOGY TABLE. 3. Lymph node, biopsy, portal - ONE OF ONE LYMPH NODE NEGATIVE FOR CARCINOMA (0/1). - LIPOGRANULOMATOUS CHANGE. 4. Lymph node, biopsy, common hepatic - ONE OF ONE LYMPH NODE NEGATIVE FOR CARCINOMA (0/1). - LIPOGRANULOMATOUS CHANGE. 5. Lymph node, biopsy, Peripancreatic - ONE OF ONE LYMPH NODE NEGATIVE FOR CARCINOMA (0/1).  Diagnosis 03/20/2016 FINE NEEDLE ASPIRATION, ENDOSCOPIC, PANCREAS HEAD (SPECIMEN 1 OF 1 COLLECTED 03/20/16): MALIGNANT CELLS CONSISTENT WITH ADENOCARCINOMA. Preliminary Diagnosis Intraoperative Diagnosis: Adequate. ([facility])  Diagnosis 03/13/2016 Duodenum, Biopsy, Duodenal mass - BENIGN ULCERATED AND INFLAMED SMALL BOWEL-TYPE MUCOSA. - THERE IS NO EVIDENCE OF MALIGNANCY. - SEE COMMENT.  RADIOGRAPHIC STUDIES: I have personally reviewed the radiological images as listed and agreed with the findings in the report.  CT CAP W Contrast 12/25/16 IMPRESSION: 1. Today's study demonstrates evidence of new widespread metastatic disease to the liver, as discussed above. 2. No definite metastatic disease noted elsewhere in the chest or pelvis. 3. Status post Whipple procedure. 4. Aortic atherosclerosis, in addition to 2 vessel coronary artery disease. Please note that although the presence of coronary artery calcium documents the presence of coronary artery disease, the severity of this disease and any potential stenosis cannot be assessed on this non-gated CT examination. Assessment for potential risk factor modification, dietary therapy or pharmacologic therapy may be warranted, if clinically indicated. 5. Additional incidental  findings, as above.   CT Abdomen Pelvis w/ Contrast 08/31/2016 IMPRESSION: 1. Status post classic appearing Whipple procedure by dehiscence of suture sites. Marked gastric distention with fluid and food may reflect delayed gastric emptying. Fluid-filled jejunum has the appearance of postoperative ileus. No definite transition site is identified. 2. No sterile fluid collection or abscess. There are mottled gas like lucencies in the gallbladder fossa which may represent postop change.  EUS Dr. Benson [place]  Endosonographic Finding 03/20/2016 Findings: An irregular mass was identified in the pancreatic head. The mass was hypoechoic. The mass measured 39 mm by 32 mm in maximal cross-sectional diameter. The outer margins were irregular. An intact interface was seen between the mass and the celiac trunk and portal vein suggesting a lack of invasion. Fine needle aspiration for cytology was performed. Color Doppler imaging was utilized prior to needle puncture to confirm a lack of significant vascular structures within the needle path. Five passes were made with the 25 gauge needle using a transduodenal approach. A stylet was used. A cytotechnologist was present to evaluate the adequacy of the specimen. The cellularity of the specimen was adequate. Final cytology results are pending. - A mass was identified in the pancreatic head. Tissue was obtained from this exam, and results are pending. However, the endosonographic appearance is highly suspicious for adenocarcinoma. Fine needle aspiration performed.  ERCP 03/13/2016 Impression: - One covered metal stent was placed into the common bile duct.  CT chest, abdomen, pelvis w contrast 06/05/2016 IMPRESSION: Mildly decreased size of mass within pancreatic head and uncinate process, with common bile duct stent in appropriate position.  No evidence of metastatic disease or other acute findings within the chest, abdomen,  or pelvis.  ASSESSMENT &  PLAN:  61 y.o. African-American male, with past medical history of diabetes and hypertension, presented with epigastric pain, weight loss, and jaundice.  1. 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 -The patient underwent surgery with Dr. Donell Beers on 08/21/2016 with Whipple procedure/resection of the pancreas, liver biopsy, and lymph node biopsy. Liver biopsy was benign. Whipple procedure revealed moderately differentiated ampullary invasive adenocarcinoma measuring 2.8 cm. The tumor invaded the peripancreatic tissue and the pancreas > 0.5 cm. The resection margins were negative. 1/19 biopsied lymph nodes were positive. -We previously discussed the patient's pathology and high risk of recurrence after surgery disease locally advanced disease even after neoadjuvant therapy.  -He has started adjuvant gemcitabine, tolerating moderately well -He has developed significant abdominal pain, fatigue, anorexia, concerning for cancer recurrence --Tumor marker is elevating as CA 19-9 is 261 (4/23), concerning for cancer recurrence. -I discussed his restaging CT scan from 12/25/2016, which unfortunately reviewed numerous liver metastasis. Giving the clinical presentation, this is almost certain metastasis from his previous pancreatic cancer. I do not feel liver biopsy is very necessary. -He went to the cancer centers of [place] in [place], underwent liver biopsy which confirmed metastatic disease. He received 1 cycle of chemotherapy FOLFOX there but tolerated poorly, and stopped. -He unfortunately has deteriorated quickly lately, has developed significant jaundice, likely secondary to his liver metastasis. I'll obtain a abdominal ultrasound in the next few days, to see if he has obstructive jaundice and if he is a candidate for stenting  -I  again discussed the incurable nature of his metastatic cancer, an overall very poor prognosis. Giving his significant liver failure, poor performance status, he is not a candidate for chemotherapy. -His tumor was tested for MSI which was stable, and PD-L1 expression was negative, he is not a candidate for immunotherapy. -I recommend palliative care alone and hospice. Patient appears to be very depressed, he states he will think about, but does not seem to be interested even in medications to help with his pain and poor appetite.  -I called his wife after his visit (with pt's permission), and updated her the above. She is open to supportive care alone and will discuss with him.   2. HTN and DM - he will continue medication and follow up with his primary care physician   3. Malnutrition and weight loss -He has previously lost 6 lbs in a week -he was previously seen by our dietitian Britta Mccreedy and will follow up. -We'll watch his weight and nutrition status closely -He was on TPN, off now  -I encouraged him to take nutritional supplement, I recommend him to try mirtazapine to stimulate his appetite, he declined.  4. Depression -The patient is depressed about his diagnosis. -We again discussed the patient speaking with social work and the chaplain for which he has their numbers. -He previously tried mirtazapine, but did not like it and has stopped  - I encouraged him to restart mirtazapine, he denied.  5. Abdominal pain -He is taking short-acting pain medication oxycodone every 4 hours, he is not sure about dose. His pain is not well controlled. -I encouraged him to consider long-acting MS Contin, and offered him a prescription. However he states he will think about it, and declined.  6. Goal of care discussion  -We discussed the incurable nature of his metastatic cancer, and the overall poor prognosis, especially if  he does not have good response to chemotherapy or progress on chemo -The  patient understands the goal of care is palliative. -I recommend DNR/DNI, he will think about it   Plan - I called his wife and discussed his poor prognosis and recommended hospice, pt and his wife will think about it  - request abdominal US for Thursday to see if he has obstructive jaundice and if he is a candidate for stenting  - lab and f/u 7/13  All questions were answered. The patient knows to call the clinic with any problems, questions or concerns.  I spent 25 minutes counseling the patient face to face. I spoke with his wife after his visit. The total time spent in the appointment was 40 minutes and more than 50% was on counseling.  This document serves as a record of services personally performed by Truitt Merle, MD. It was created on her behalf by Brandt Loosen, a trained medical scribe. The creation of this record is based on the scribe's personal observations and the provider's statements to them. This document has been checked and approved by the attending provider.   I have reviewed the above documentation for accuracy and completeness and I agree with the above.   Truitt Merle, MD 02/10/2017

## 2017-02-11 ENCOUNTER — Encounter: Payer: Self-pay | Admitting: Hematology

## 2017-02-11 DIAGNOSIS — C787 Secondary malignant neoplasm of liver and intrahepatic bile duct: Secondary | ICD-10-CM | POA: Insufficient documentation

## 2017-02-11 LAB — CANCER ANTIGEN 19-9: CA 19-9: 11044 U/mL — ABNORMAL HIGH (ref 0–35)

## 2017-02-12 ENCOUNTER — Ambulatory Visit (HOSPITAL_COMMUNITY)
Admission: RE | Admit: 2017-02-12 | Discharge: 2017-02-12 | Disposition: A | Discharge status: Home / Self Care | Payer: BLUE CROSS/BLUE SHIELD | Source: Ambulatory Visit | Attending: Hematology | Admitting: Hematology

## 2017-02-12 DIAGNOSIS — Z9049 Acquired absence of other specified parts of digestive tract: Secondary | ICD-10-CM | POA: Diagnosis not present

## 2017-02-12 DIAGNOSIS — C259 Malignant neoplasm of pancreas, unspecified: Secondary | ICD-10-CM | POA: Insufficient documentation

## 2017-02-13 ENCOUNTER — Telehealth: Payer: Self-pay | Admitting: Hematology

## 2017-02-13 ENCOUNTER — Telehealth: Payer: Self-pay | Admitting: *Deleted

## 2017-02-13 MED ORDER — MORPHINE SULFATE ER 30 MG PO TBCR: 30.0000 mg | EXTENDED_RELEASE_TABLET | Freq: Two times a day (BID) | ORAL | 0 refills | Status: AC

## 2017-02-13 MED ORDER — DRONABINOL 2.5 MG PO CAPS: 2.5000 mg | ORAL_CAPSULE | Freq: Two times a day (BID) | ORAL | 1 refills | Status: AC

## 2017-02-13 NOTE — Telephone Encounter (Signed)
I called pt and his wife yesterday and reviewed his abdominal ultrasound results. He has significant biliary obstructure from liver metastasis. Patient declined stent placement. Patient is extremely fatigued, staying in the bed most of time, only drinks water with his medication, not eating much. His pain is not well controlled. I recommend hospice, and they will think about it.  Patient's wife came in this morning, and I had reviewed overall situation with her again. Patient and his wife have agreed hospice, I'll make a referral today, and I request him to see him at home today. He may need residential hospice soon.  I gave him prescriptions of MS Contin 30 mg every 12 hours, and Marinol 2.5 mg twice daily for nausea and anorexia.  I'll recommend to be his MD when he is under hospice care.   Truitt Merle 02/13/2017 8:01 AM

## 2017-02-13 NOTE — Telephone Encounter (Signed)
Referral made to [facility] per Dr Ernestina Penna request.  Asked that they see him urgently & may need [facility] soon.  Informed that they would see him today as long as they could get in touch with pt/wife to arrange.

## 2017-02-13 NOTE — Telephone Encounter (Signed)
NO follow up appointment or lab to be scheduled per Dr Burr Medico, as per patient was admitted to [facility].

## 2017-02-13 NOTE — Addendum Note (Signed)
Addended by: Truitt Merle on: 02/13/2017 07:56 AM   Modules accepted: Orders

## 2017-02-20 ENCOUNTER — Encounter (HOSPITAL_COMMUNITY): Payer: Self-pay

## 2017-03-09 ENCOUNTER — Telehealth: Payer: Self-pay | Admitting: *Deleted

## 2017-03-09 NOTE — Telephone Encounter (Signed)
Received vm from wife, Curly Shores stating that she has some concerns that she would like to talk to Dr Burr Medico about.  Call returned & she states that pt is hallucinating, confused, eating very little & she just can't handle him at home & work to keep insurance.  She states she has a bad back & was sent home from work today b/c her BP is up.  She was also told that it would cost $100/day to go to [facility] per Education officer, museum.  She states that the [facility]osp[facility] RN is to come out today.  Encouraged to discuss these issues with RN. Message left at Hospice to have nurse, Shay to call with update & to verify cost.

## 2017-03-10 ENCOUNTER — Telehealth: Payer: Self-pay | Admitting: *Deleted

## 2017-03-10 NOTE — Telephone Encounter (Signed)
"  Pascal Lux RN with [facility] calling to notify Dr. Burr Medico this patient is being moved to [facility] for end of life care."  Will notify provider.

## 2017-03-25 ENCOUNTER — Other Ambulatory Visit: Payer: Self-pay | Admitting: Nurse Practitioner

## 2017-04-11 DEATH — deceased

## 2017-04-14 IMAGING — DX DG ABD PORTABLE 1V
1 series · 1 of 1 positions shown · non-contrast
Comparison: None.

CLINICAL DATA: 60 y/o  M; intraoperative needle miscount.

EXAM:
PORTABLE ABDOMEN - 1 VIEW

[abdomen kub]
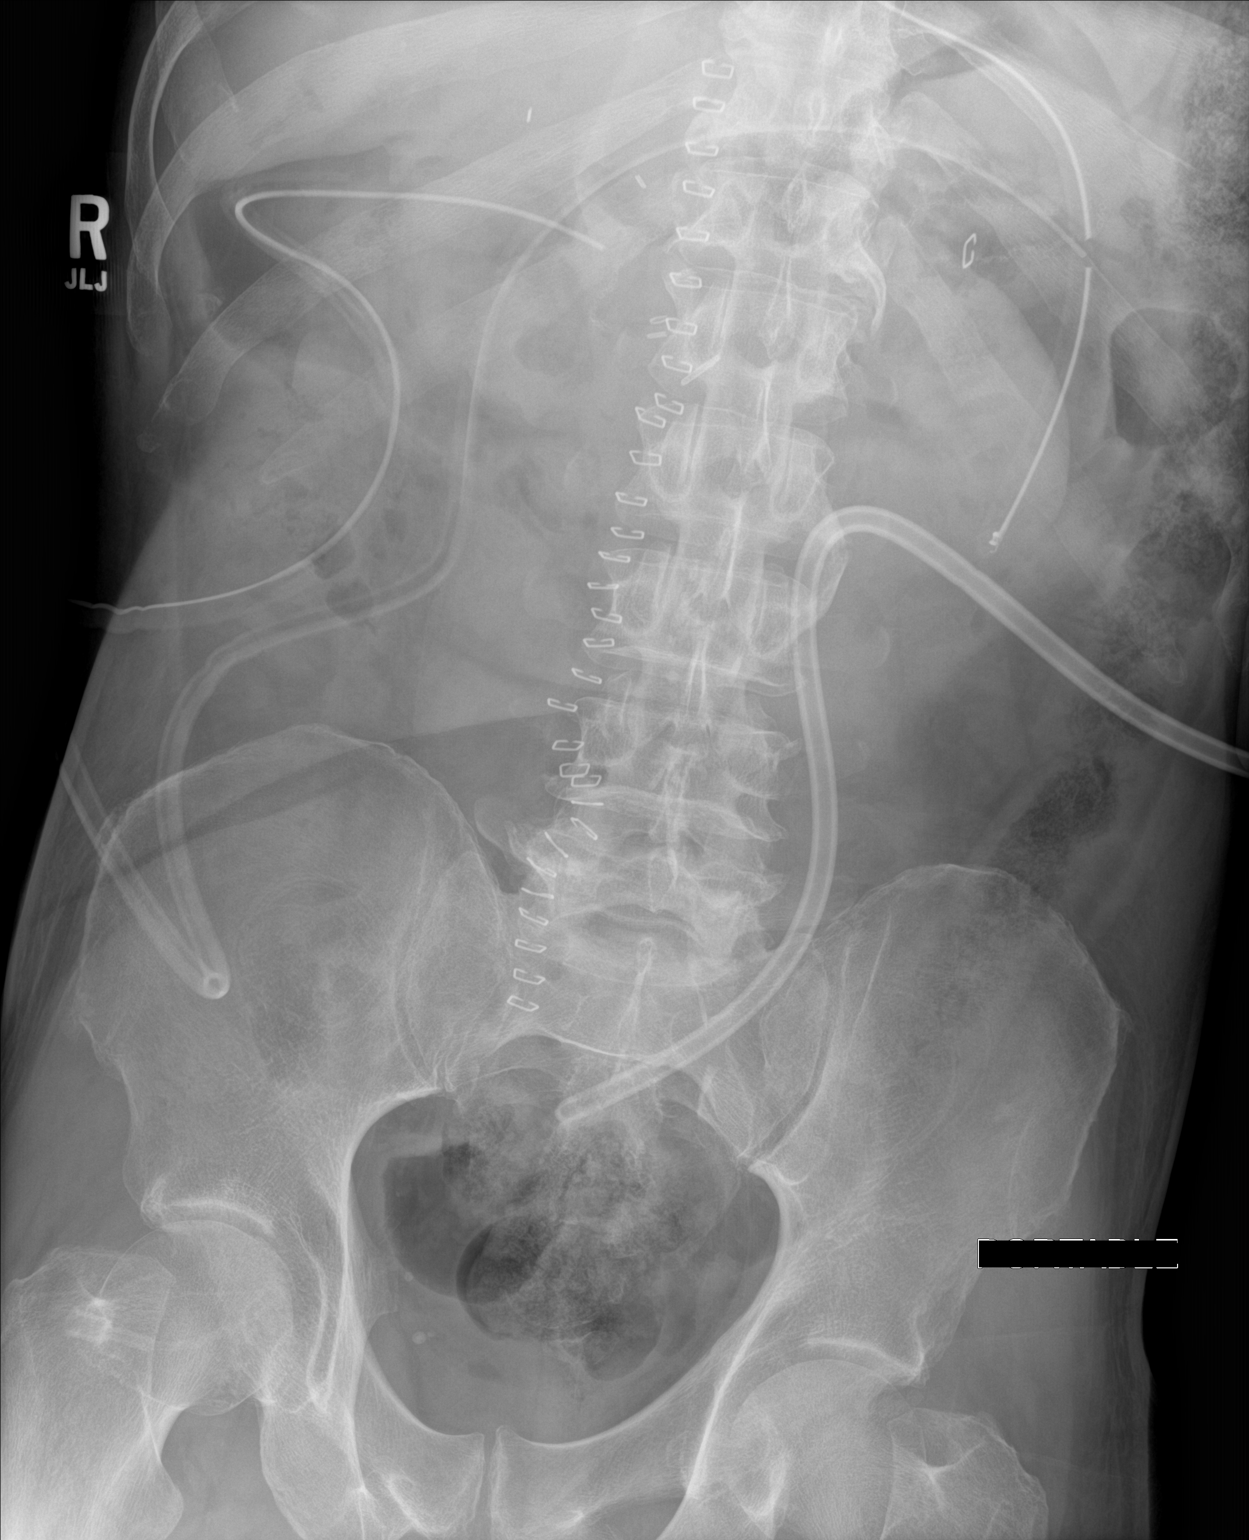

[1 of 1 positions shown; findings below may reference images not displayed]

FINDINGS: Multiple drains, skin staples, and surgical clips are present. No
needle is identified. Postoperative pneumoperitoneum. Unremarkable
bowel gas pattern. No acute osseous abnormality identified.
IMPRESSION: Multiple drains, skin staples, and surgical clips are present. No
needle is identified.

These results were called by telephone at the time of interpretation
on 08/21/2016 at [DATE] to Dr. PHUONG SEQUEIRA , who verbally
acknowledged these results.

By: Amewuga Asomanin M.D.
